# Patient Record
Sex: Female | Born: 1965 | Race: Black or African American | Hispanic: No | Smoking: Never smoker
Health system: Southern US, Community
[De-identification: ages and names within clinical notes are randomized; demographics above are authoritative.]

## PROBLEM LIST (undated history)

## (undated) DIAGNOSIS — R011 Cardiac murmur, unspecified: Secondary | ICD-10-CM

## (undated) DIAGNOSIS — D649 Anemia, unspecified: Secondary | ICD-10-CM

## (undated) DIAGNOSIS — R002 Palpitations: Secondary | ICD-10-CM

## (undated) DIAGNOSIS — R51 Headache: Secondary | ICD-10-CM

## (undated) DIAGNOSIS — D259 Leiomyoma of uterus, unspecified: Secondary | ICD-10-CM

## (undated) DIAGNOSIS — E119 Type 2 diabetes mellitus without complications: Secondary | ICD-10-CM

## (undated) DIAGNOSIS — I499 Cardiac arrhythmia, unspecified: Secondary | ICD-10-CM

## (undated) DIAGNOSIS — D573 Sickle-cell trait: Secondary | ICD-10-CM

## (undated) DIAGNOSIS — M1990 Unspecified osteoarthritis, unspecified site: Secondary | ICD-10-CM

## (undated) DIAGNOSIS — I10 Essential (primary) hypertension: Secondary | ICD-10-CM

## (undated) DIAGNOSIS — T7840XA Allergy, unspecified, initial encounter: Secondary | ICD-10-CM

## (undated) DIAGNOSIS — R0602 Shortness of breath: Secondary | ICD-10-CM

## (undated) DIAGNOSIS — Z5189 Encounter for other specified aftercare: Secondary | ICD-10-CM

## (undated) DIAGNOSIS — J309 Allergic rhinitis, unspecified: Secondary | ICD-10-CM

## (undated) HISTORY — DX: Encounter for other specified aftercare: Z51.89

## (undated) HISTORY — DX: Shortness of breath: R06.02

## (undated) HISTORY — DX: Cardiac murmur, unspecified: R01.1

## (undated) HISTORY — DX: Allergy, unspecified, initial encounter: T78.40XA

## (undated) HISTORY — DX: Leiomyoma of uterus, unspecified: D25.9

## (undated) HISTORY — DX: Type 2 diabetes mellitus without complications: E11.9

## (undated) HISTORY — DX: Headache: R51

## (undated) HISTORY — DX: Allergic rhinitis, unspecified: J30.9

## (undated) HISTORY — DX: Anemia, unspecified: D64.9

## (undated) HISTORY — PX: MYOMECTOMY: SHX85

## (undated) HISTORY — DX: Essential (primary) hypertension: I10

## (undated) HISTORY — DX: Sickle-cell trait: D57.3

## (undated) HISTORY — DX: Palpitations: R00.2

---

## 2002-02-23 ENCOUNTER — Encounter: Payer: Self-pay | Admitting: Emergency Medicine

## 2002-02-23 ENCOUNTER — Emergency Department (HOSPITAL_COMMUNITY): Admission: EM | Admit: 2002-02-23 | Discharge: 2002-02-23 | Payer: Self-pay | Admitting: Emergency Medicine

## 2002-06-01 ENCOUNTER — Encounter: Admission: RE | Admit: 2002-06-01 | Discharge: 2002-06-01 | Payer: Self-pay | Admitting: Internal Medicine

## 2002-06-15 ENCOUNTER — Encounter: Admission: RE | Admit: 2002-06-15 | Discharge: 2002-06-15 | Payer: Self-pay | Admitting: Internal Medicine

## 2002-06-29 ENCOUNTER — Encounter: Admission: RE | Admit: 2002-06-29 | Discharge: 2002-06-29 | Payer: Self-pay | Admitting: Internal Medicine

## 2002-07-26 ENCOUNTER — Encounter: Admission: RE | Admit: 2002-07-26 | Discharge: 2002-07-26 | Payer: Self-pay | Admitting: Internal Medicine

## 2007-02-01 ENCOUNTER — Emergency Department (HOSPITAL_COMMUNITY): Admission: EM | Admit: 2007-02-01 | Discharge: 2007-02-01 | Payer: Self-pay | Admitting: Emergency Medicine

## 2008-06-29 ENCOUNTER — Emergency Department (HOSPITAL_COMMUNITY): Admission: EM | Admit: 2008-06-29 | Discharge: 2008-06-29 | Payer: Self-pay | Admitting: Emergency Medicine

## 2008-08-20 ENCOUNTER — Ambulatory Visit: Payer: Self-pay | Admitting: Internal Medicine

## 2008-08-20 ENCOUNTER — Encounter: Payer: Self-pay | Admitting: Internal Medicine

## 2008-08-20 ENCOUNTER — Other Ambulatory Visit: Admission: RE | Admit: 2008-08-20 | Discharge: 2008-08-20 | Payer: Self-pay | Admitting: Internal Medicine

## 2008-08-20 DIAGNOSIS — R519 Headache, unspecified: Secondary | ICD-10-CM | POA: Insufficient documentation

## 2008-08-20 DIAGNOSIS — R51 Headache: Secondary | ICD-10-CM

## 2008-08-20 DIAGNOSIS — J309 Allergic rhinitis, unspecified: Secondary | ICD-10-CM

## 2008-08-20 DIAGNOSIS — D259 Leiomyoma of uterus, unspecified: Secondary | ICD-10-CM

## 2008-08-20 DIAGNOSIS — D573 Sickle-cell trait: Secondary | ICD-10-CM

## 2008-08-20 DIAGNOSIS — I10 Essential (primary) hypertension: Secondary | ICD-10-CM

## 2008-08-20 HISTORY — DX: Sickle-cell trait: D57.3

## 2008-08-20 HISTORY — DX: Allergic rhinitis, unspecified: J30.9

## 2008-08-20 HISTORY — DX: Leiomyoma of uterus, unspecified: D25.9

## 2008-08-20 HISTORY — DX: Essential (primary) hypertension: I10

## 2008-08-20 HISTORY — DX: Headache: R51

## 2008-08-20 LAB — HM PAP SMEAR

## 2008-08-23 ENCOUNTER — Encounter: Admission: RE | Admit: 2008-08-23 | Discharge: 2008-08-23 | Payer: Self-pay | Admitting: Internal Medicine

## 2008-11-12 ENCOUNTER — Ambulatory Visit: Payer: Self-pay | Admitting: Cardiovascular Disease

## 2008-11-12 DIAGNOSIS — R0602 Shortness of breath: Secondary | ICD-10-CM

## 2008-11-12 DIAGNOSIS — R002 Palpitations: Secondary | ICD-10-CM | POA: Insufficient documentation

## 2008-11-12 DIAGNOSIS — R011 Cardiac murmur, unspecified: Secondary | ICD-10-CM

## 2008-11-12 HISTORY — DX: Palpitations: R00.2

## 2008-11-12 HISTORY — DX: Shortness of breath: R06.02

## 2008-11-12 HISTORY — DX: Cardiac murmur, unspecified: R01.1

## 2008-11-28 ENCOUNTER — Ambulatory Visit: Payer: Self-pay

## 2008-11-28 ENCOUNTER — Encounter: Payer: Self-pay | Admitting: Cardiovascular Disease

## 2008-11-28 ENCOUNTER — Encounter: Payer: Self-pay | Admitting: Internal Medicine

## 2009-11-19 ENCOUNTER — Ambulatory Visit (HOSPITAL_COMMUNITY): Admission: RE | Admit: 2009-11-19 | Discharge: 2009-11-19 | Payer: Self-pay | Admitting: Obstetrics and Gynecology

## 2009-12-21 DIAGNOSIS — Z5189 Encounter for other specified aftercare: Secondary | ICD-10-CM

## 2009-12-21 HISTORY — DX: Encounter for other specified aftercare: Z51.89

## 2010-11-20 ENCOUNTER — Inpatient Hospital Stay (HOSPITAL_COMMUNITY)
Admission: RE | Admit: 2010-11-20 | Discharge: 2010-11-24 | Payer: Self-pay | Source: Home / Self Care | Admitting: Obstetrics and Gynecology

## 2010-11-20 ENCOUNTER — Encounter (INDEPENDENT_AMBULATORY_CARE_PROVIDER_SITE_OTHER): Payer: Self-pay | Admitting: Obstetrics and Gynecology

## 2010-12-11 ENCOUNTER — Ambulatory Visit: Payer: Self-pay | Admitting: Internal Medicine

## 2010-12-17 ENCOUNTER — Telehealth: Payer: Self-pay | Admitting: Internal Medicine

## 2010-12-23 ENCOUNTER — Ambulatory Visit (HOSPITAL_COMMUNITY)
Admission: RE | Admit: 2010-12-23 | Discharge: 2010-12-23 | Payer: Self-pay | Source: Home / Self Care | Attending: Obstetrics and Gynecology | Admitting: Obstetrics and Gynecology

## 2010-12-25 ENCOUNTER — Ambulatory Visit
Admission: RE | Admit: 2010-12-25 | Discharge: 2010-12-25 | Payer: Self-pay | Source: Home / Self Care | Attending: Internal Medicine | Admitting: Internal Medicine

## 2011-01-11 ENCOUNTER — Encounter: Payer: Self-pay | Admitting: Obstetrics and Gynecology

## 2011-01-22 NOTE — Assessment & Plan Note (Signed)
Summary: 2 wk rov/njr   Vital Signs:  Patient profile:   45 year old female Weight:      184 pounds Temp:     98.4 degrees F oral BP sitting:   110 / 70  (right arm) Cuff size:   regular  Vitals Entered By: Duard Brady LPN (December 25, 2010 12:41 PM) CC: rov - doing good - f/u on BP Is Patient Diabetic? No   CC:  rov - doing good - f/u on BP.  History of Present Illness: 45 year old patient who is seen today for follow-up.  She is followed closely by OB/GYN for infertility.  She has hypertension, which has well controlled on her present regimen.  Antihypertensive regimen discussed with OB/GYN and her present regimen is preferred due to a safer long-term side affect profile  during  pregnancy.  She has tolerated the medication well.  She is on a salt restricted diet  Allergies (verified): No Known Drug Allergies  Past History:  Past Medical History: Reviewed history from 11/12/2008 and no changes required. sickle cell trait Allergic rhinitis Headache Hypertension  Past Surgical History: status post myomectomy, December 2011  Family History: Reviewed history from 08/20/2008 and no changes required. father died age 22, for lung cancer, history of prostate cancer mother is 38, hypertension  One sister with treated hypertension 5 brothers are well  Physical Exam  General:  Well-developed,well-nourished,in no acute distress; alert,appropriate and cooperative throughout examination;  the pressure 110/70 Mouth:  Oral mucosa and oropharynx without lesions or exudates.  Teeth in good repair. Neck:  No deformities, masses, or tenderness noted. Lungs:  Normal respiratory effort, chest expands symmetrically. Lungs are clear to auscultation, no crackles or wheezes. Heart:  Normal rate and regular rhythm. S1 and S2 normal without gallop, murmur, click, rub or other extra sounds. Extremities:  trace left pedal edema and trace right pedal edema.  trace left pedal edema.      Impression & Recommendations:  Problem # 1:  HYPERTENSION (ICD-401.9)  The following medications were removed from the medication list:    Amlodipine Besylate 10 Mg Tabs (Amlodipine besylate) ..... One daily    Bisoprolol Fumarate 5 Mg Tabs (Bisoprolol fumarate) ..... One daily Her updated medication list for this problem includes:    Labetalol Hcl 100 Mg Tabs (Labetalol hcl) ..... Qd    Procardia Xl 60 Mg Xr24h-tab (Nifedipine) ..... Qd  The following medications were removed from the medication list:    Amlodipine Besylate 10 Mg Tabs (Amlodipine besylate) ..... One daily    Bisoprolol Fumarate 5 Mg Tabs (Bisoprolol fumarate) ..... One daily Her updated medication list for this problem includes:    Labetalol Hcl 100 Mg Tabs (Labetalol hcl) ..... Qd    Procardia Xl 60 Mg Xr24h-tab (Nifedipine) ..... Qd  Complete Medication List: 1)  Labetalol Hcl 100 Mg Tabs (Labetalol hcl) .... Qd 2)  Procardia Xl 60 Mg Xr24h-tab (Nifedipine) .... Qd 3)  Integra Plus Caps (Fefum-fepoly-fa-b cmp-c-biot) .... Qd  Patient Instructions: 1)  Please schedule a follow-up appointment in 4 months. 2)  Limit your Sodium (Salt). 3)  It is important that you exercise regularly at least 20 minutes 5 times a week. If you develop chest pain, have severe difficulty breathing, or feel very tired , stop exercising immediately and seek medical attention. 4)  You need to lose weight. Consider a lower calorie diet and regular exercise.  Prescriptions: PROCARDIA XL 60 MG XR24H-TAB (NIFEDIPINE) qd  #90 x 4   Entered  and Authorized by:   Gordy Savers  MD   Signed by:   Gordy Savers  MD on 12/25/2010   Method used:   [facility]        [facility] [phone][facility] (retail)       [address] [place], [place]  [address]       Ph: [phone] or [phone]       Fax: [phone]   RxID:   [id number] LABETALOL HCL 100 MG TABS (LABETALOL HCL) qd  #90 x 4   Entered and Authorized by:    Gordy Savers  MD   Signed by:   Gordy Savers  MD on 12/25/2010   Method used:   [facility]        [facility] [phone]* (retail)       [address] [place], [place]  [address]       Ph: [phone] or [phone]       Fax: [phone]   RxID:   [id number]    Orders Added: 1)  Est. Patient Level III [94854]

## 2011-01-22 NOTE — Assessment & Plan Note (Signed)
Summary: HTN CONCERNS // RS   Vital Signs:  Patient profile:   45 year old female Weight:      185 pounds Temp:     98.5 degrees F oral BP sitting:   120 / 80  (right arm) Cuff size:   regular  Vitals Entered By: Duard Brady LPN (December 11, 2010 3:05 PM) CC: HTN concerns Is Patient Diabetic? No   CC:  HTN concerns.  History of Present Illness:  45 year old patient who is seen today after a greater than two-year absence.  At that time, she was seen as a hypertensive suspect the blood pressure was normal and she has not been seen for follow-up.  She has had a recent hysterectomy and blood pressure has been quite elevated and she has recently started on antihypertensive therapy.  She was initially placed on Procardia 30 mg daily, and this has been titrated to 60 mg daily.  Yesterday, labetalol was added to her regimen, but she has not picked up the prescription yet.  She feels well today with some modest postoperative discomfort. Two years ago.  She had a cardiac evaluation due to chest pain and palpitations.  Endocrine medicine stress test was normal and echocardiogram revealed normal LV function and no wall thickening or evidence of LVH  Preventive Screening-Counseling & Management  Alcohol-Tobacco     Smoking Status: never  Allergies (verified): No Known Drug Allergies  Past History:  Past Medical History: Reviewed history from 11/12/2008 and no changes required. sickle cell trait Allergic rhinitis Headache Hypertension  Past Surgical History: status post hysterectomy, December 2011  Family History: Reviewed history from 08/20/2008 and no changes required. father died age 24, for lung cancer, history of prostate cancer mother is 51, hypertension  One sister with treated hypertension 5 brothers are well  Review of Systems  The patient denies anorexia, fever, weight loss, weight gain, vision loss, decreased hearing, hoarseness, chest pain, syncope, dyspnea  on exertion, peripheral edema, prolonged cough, headaches, hemoptysis, abdominal pain, melena, hematochezia, severe indigestion/heartburn, hematuria, incontinence, genital sores, muscle weakness, suspicious skin lesions, transient blindness, difficulty walking, depression, unusual weight change, abnormal bleeding, enlarged lymph nodes, angioedema, and breast masses.    Physical Exam  General:  overweight-appearing.  140/84 ( 120/80. On arrival )overweight-appearing.   Head:  Normocephalic and atraumatic without obvious abnormalities. No apparent alopecia or balding. Mouth:  Oral mucosa and oropharynx without lesions or exudates.  Teeth in good repair. Neck:  No deformities, masses, or tenderness noted. Lungs:  Normal respiratory effort, chest expands symmetrically. Lungs are clear to auscultation, no crackles or wheezes. Heart:  Normal rate and regular rhythm. S1 and S2 normal without gallop, murmur, click, rub or other extra sounds. Extremities:   Melody   Impression & Recommendations:  Problem # 1:  HYPERTENSION (ICD-401.9)  Her updated medication list for this problem includes:    Labetalol Hcl 100 Mg Tabs (Labetalol hcl) ..... Qd    Procardia Xl 60 Mg Xr24h-tab (Nifedipine) ..... Qd    Amlodipine Besylate 10 Mg Tabs (Amlodipine besylate) ..... One daily    Bisoprolol Fumarate 5 Mg Tabs (Bisoprolol fumarate) ..... One daily  Her updated medication list for this problem includes:    Labetalol Hcl 100 Mg Tabs (Labetalol hcl) ..... Qd    Procardia Xl 60 Mg Xr24h-tab (Nifedipine) ..... Qd    Amlodipine Besylate 10 Mg Tabs (Amlodipine besylate) ..... One daily    Bisoprolol Fumarate 5 Mg Tabs (Bisoprolol fumarate) ..... One daily  Problem # 2:  PALPITATIONS, RECURRENT (ICD-785.1)  Her updated medication list for this problem includes:    Labetalol Hcl 100 Mg Tabs (Labetalol hcl) ..... Qd    Bisoprolol Fumarate 5 Mg Tabs (Bisoprolol fumarate) ..... One daily  Her updated medication  list for this problem includes:    Labetalol Hcl 100 Mg Tabs (Labetalol hcl) ..... Qd    Bisoprolol Fumarate 5 Mg Tabs (Bisoprolol fumarate) ..... One daily  Complete Medication List: 1)  Labetalol Hcl 100 Mg Tabs (Labetalol hcl) .... Qd 2)  Procardia Xl 60 Mg Xr24h-tab (Nifedipine) .... Qd 3)  Integra Plus Caps (Fefum-fepoly-fa-b cmp-c-biot) .... Qd 4)  Amlodipine Besylate 10 Mg Tabs (Amlodipine besylate) .... One daily 5)  Bisoprolol Fumarate 5 Mg Tabs (Bisoprolol fumarate) .... One daily  Patient Instructions: 1)  Please schedule a follow-up appointment in 2 weeks. 2)  Limit your Sodium (Salt) to less than 2 grams a day(slightly less than 1/2 a teaspoon) to prevent fluid retention, swelling, or worsening of symptoms. 3)  It is important that you exercise regularly at least 20 minutes 5 times a week. If you develop chest pain, have severe difficulty breathing, or feel very tired , stop exercising immediately and seek medical attention. 4)  You need to lose weight. Consider a lower calorie diet and regular exercise.  Prescriptions: BISOPROLOL FUMARATE 5 MG TABS (BISOPROLOL FUMARATE) one daily  #90 x 0   Entered and Authorized by:   Gordy Savers  MD   Signed by:   Gordy Savers  MD on 12/11/2010   Method used:   Print then Give to Patient   RxID:   [id number] AMLODIPINE BESYLATE 10 MG TABS (AMLODIPINE BESYLATE) one daily  #90 x 0   Entered and Authorized by:   Gordy Savers  MD   Signed by:   Gordy Savers  MD on 12/11/2010   Method used:   Print then Give to Patient   RxID:   [id number]    Orders Added: 1)  Est. Patient Level III [95284]

## 2011-01-22 NOTE — Progress Notes (Signed)
Summary: bp meds  Phone Note Outgoing Call   Call placed by: Duard Brady LPN,  December 17, 2010 1:00 PM Call placed to: Patient Summary of Call: per Dr.Kwiatkowski - stop amlodipine and bisoprolol , continue procardia and labetalol  [facility] Initial call taken by: Duard Brady LPN,  December 17, 2010 1:05 PM    Prescriptions: LABETALOL HCL 100 MG TABS (LABETALOL HCL) qd  #90 x 3   Entered by:   Duard Brady LPN   Authorized by:   Gordy Savers  MD   Signed by:   Duard Brady LPN on 16/09/9603   Method used:   [facility] to        [facility] [phone]* (retail)       [address] [place], [place]  [address]       Ph: [phone] or [phone]       Fax: [phone]   RxID:   [id number]

## 2011-03-03 LAB — PREPARE CRYOPRECIPITATE
Unit division: 0
Unit division: 0
Unit division: 0
Unit division: 0
Unit division: 0

## 2011-03-03 LAB — DIFFERENTIAL
Basophils Absolute: 0 10*3/uL (ref 0.0–0.1)
Basophils Absolute: 0 10*3/uL (ref 0.0–0.1)
Basophils Relative: 0 % (ref 0–1)
Basophils Relative: 1 % (ref 0–1)
Eosinophils Relative: 0 % (ref 0–5)
Eosinophils Relative: 0 % (ref 0–5)
Lymphocytes Relative: 11 % — ABNORMAL LOW (ref 12–46)
Lymphocytes Relative: 4 % — ABNORMAL LOW (ref 12–46)
Lymphs Abs: 0.3 10*3/uL — ABNORMAL LOW (ref 0.7–4.0)
Monocytes Absolute: 0.6 10*3/uL (ref 0.1–1.0)
Monocytes Absolute: 0.7 10*3/uL (ref 0.1–1.0)
Monocytes Absolute: 0.8 10*3/uL (ref 0.1–1.0)
Monocytes Relative: 6 % (ref 3–12)
Monocytes Relative: 9 % (ref 3–12)
Neutro Abs: 8.3 10*3/uL — ABNORMAL HIGH (ref 1.7–7.7)
Neutrophils Relative %: 81 % — ABNORMAL HIGH (ref 43–77)

## 2011-03-03 LAB — TYPE AND SCREEN
ABO/RH(D): A POS
Antibody Screen: NEGATIVE
Unit division: 0
Unit division: 0
Unit division: 0

## 2011-03-03 LAB — BASIC METABOLIC PANEL
BUN: 3 mg/dL — ABNORMAL LOW (ref 6–23)
BUN: 6 mg/dL (ref 6–23)
BUN: 5 mg/dL — ABNORMAL LOW (ref 6–23)
CO2: 29 mEq/L (ref 19–32)
CO2: 22 mEq/L (ref 19–32)
Calcium: 7.5 mg/dL — ABNORMAL LOW (ref 8.4–10.5)
Calcium: 9.4 mg/dL (ref 8.4–10.5)
Chloride: 105 mEq/L (ref 96–112)
Creatinine, Ser: 0.56 mg/dL (ref 0.4–1.2)
GFR calc non Af Amer: >60 mL/min (ref >60)
Glucose, Bld: 114 mg/dL — ABNORMAL HIGH (ref 70–99)
Glucose, Bld: 122 mg/dL — ABNORMAL HIGH (ref 70–99)
Glucose, Bld: 124 mg/dL — ABNORMAL HIGH (ref 70–99)
Potassium: 3.3 mEq/L — ABNORMAL LOW (ref 3.5–5.1)
Potassium: 3.4 mEq/L — ABNORMAL LOW (ref 3.5–5.1)
Sodium: 140 mEq/L (ref 135–145)
Sodium: 139 mEq/L (ref 135–145)

## 2011-03-03 LAB — PREPARE FRESH FROZEN PLASMA
Unit division: 0
Unit division: 0

## 2011-03-03 LAB — PREPARE PLATELETS

## 2011-03-03 LAB — CBC
HCT: 14.6 % — ABNORMAL LOW (ref 36.0–46.0)
HCT: 18.2 % — ABNORMAL LOW (ref 36.0–46.0)
HCT: 22.1 % — ABNORMAL LOW (ref 36.0–46.0)
HCT: 23.5 % — ABNORMAL LOW (ref 36.0–46.0)
HCT: 25.2 % — ABNORMAL LOW (ref 36.0–46.0)
Hemoglobin: 5 g/dL — CL (ref 12.0–15.0)
Hemoglobin: 6.6 g/dL — CL (ref 12.0–15.0)
Hemoglobin: 7.5 g/dL — ABNORMAL LOW (ref 12.0–15.0)
Hemoglobin: 8.1 g/dL — ABNORMAL LOW (ref 12.0–15.0)
Hemoglobin: 8.7 g/dL — ABNORMAL LOW (ref 12.0–15.0)
Hemoglobin: 11.9 g/dL — ABNORMAL LOW (ref 12.0–15.0)
MCH: 31 pg (ref 26.0–34.0)
MCH: 32 pg (ref 26.0–34.0)
MCH: 27.3 pg (ref 26.0–34.0)
MCHC: 34.5 g/dL (ref 30.0–36.0)
MCHC: 34.6 g/dL (ref 30.0–36.0)
MCHC: 34.6 g/dL (ref 30.0–36.0)
MCHC: 36.5 g/dL — ABNORMAL HIGH (ref 30.0–36.0)
MCHC: 33.4 g/dL (ref 30.0–36.0)
MCV: 87.5 fL (ref 78.0–100.0)
MCV: 87.9 fL (ref 78.0–100.0)
MCV: 89.8 fL (ref 78.0–100.0)
Platelets: 102 10*3/uL — ABNORMAL LOW (ref 150–400)
RBC: 1.73 MIL/uL — ABNORMAL LOW (ref 3.87–5.11)
RBC: 1.75 MIL/uL — ABNORMAL LOW (ref 3.87–5.11)
RBC: 2.52 MIL/uL — ABNORMAL LOW (ref 3.87–5.11)
RDW: 15.4 % (ref 11.5–15.5)
RDW: 15.5 % (ref 11.5–15.5)
RDW: 15.6 % — ABNORMAL HIGH (ref 11.5–15.5)
RDW: 15.8 % — ABNORMAL HIGH (ref 11.5–15.5)
RDW: 16.4 % — ABNORMAL HIGH (ref 11.5–15.5)
WBC: 10.2 10*3/uL (ref 4.0–10.5)
WBC: 6 10*3/uL (ref 4.0–10.5)
WBC: 8.9 10*3/uL (ref 4.0–10.5)

## 2011-03-03 LAB — CARDIAC PANEL(CRET KIN+CKTOT+MB+TROPI)
CK, MB: 0.3 ng/mL (ref 0.3–4.0)
Relative Index: 0.2 (ref 0.0–2.5)
Total CK: 170 U/L (ref 7–177)
Troponin I: 0.02 ng/mL (ref 0.00–0.06)

## 2011-03-03 LAB — DIC (DISSEMINATED INTRAVASCULAR COAGULATION)PANEL
D-Dimer, Quant: >20 ug/mL-FEU — ABNORMAL HIGH (ref 0.00–0.48)
Fibrinogen: 333 mg/dL (ref 204–475)
INR: 2.1 — ABNORMAL HIGH (ref 0.00–1.49)
Platelets: 108 10*3/uL — ABNORMAL LOW (ref 150–400)
Prothrombin Time: 21.6 seconds — ABNORMAL HIGH (ref 11.6–15.2)
Smear Review: NONE SEEN
Smear Review: NONE SEEN
Smear Review: NONE SEEN

## 2011-03-03 LAB — PREGNANCY, URINE: Preg Test, Ur: NEGATIVE

## 2011-03-03 LAB — PHOSPHORUS: Phosphorus: 3.1 mg/dL (ref 2.3–4.6)

## 2011-03-05 ENCOUNTER — Telehealth: Payer: Self-pay | Admitting: Internal Medicine

## 2011-03-05 MED ORDER — INTEGRA PLUS PO CAPS: 1.0000 | ORAL_CAPSULE | Freq: Every day | ORAL | Status: DC

## 2011-03-05 NOTE — Telephone Encounter (Signed)
[facility]gra [facility] [place].

## 2011-03-21 ENCOUNTER — Inpatient Hospital Stay (HOSPITAL_COMMUNITY)
Admission: AD | Admit: 2011-03-21 | Discharge: 2011-03-21 | Disposition: A | Discharge status: Home / Self Care | Payer: BC Managed Care – PPO | Source: Ambulatory Visit | Attending: Obstetrics and Gynecology | Admitting: Obstetrics and Gynecology

## 2011-03-21 DIAGNOSIS — R109 Unspecified abdominal pain: Secondary | ICD-10-CM | POA: Insufficient documentation

## 2011-03-21 DIAGNOSIS — N719 Inflammatory disease of uterus, unspecified: Secondary | ICD-10-CM | POA: Insufficient documentation

## 2011-03-21 LAB — CBC
MCH: 27 pg (ref 26.0–34.0)
Platelets: 267 10*3/uL (ref 150–400)
RBC: 4.41 MIL/uL (ref 3.87–5.11)
RDW: 13.8 % (ref 11.5–15.5)
WBC: 8.5 10*3/uL (ref 4.0–10.5)

## 2011-03-21 LAB — URINALYSIS, ROUTINE W REFLEX MICROSCOPIC
Glucose, UA: NEGATIVE mg/dL
Leukocytes, UA: NEGATIVE
Specific Gravity, Urine: 1.02 (ref 1.005–1.030)
pH: 6 (ref 5.0–8.0)

## 2011-03-21 LAB — DIFFERENTIAL
Basophils Relative: 0 % (ref 0–1)
Eosinophils Absolute: 0.1 10*3/uL (ref 0.0–0.7)
Eosinophils Relative: 1 % (ref 0–5)
Monocytes Relative: 7 % (ref 3–12)
Neutrophils Relative %: 77 % (ref 43–77)

## 2011-03-21 LAB — URINE MICROSCOPIC-ADD ON

## 2011-04-27 ENCOUNTER — Encounter: Payer: Self-pay | Admitting: Internal Medicine

## 2011-04-30 ENCOUNTER — Encounter: Payer: Self-pay | Admitting: Internal Medicine

## 2011-04-30 ENCOUNTER — Ambulatory Visit: Payer: Self-pay | Admitting: Internal Medicine

## 2011-04-30 ENCOUNTER — Ambulatory Visit (INDEPENDENT_AMBULATORY_CARE_PROVIDER_SITE_OTHER): Payer: BC Managed Care – PPO | Admitting: Internal Medicine

## 2011-04-30 VITALS — BP 110/74 | Temp 98.3°F | Wt 173.0 lb

## 2011-04-30 DIAGNOSIS — I10 Essential (primary) hypertension: Secondary | ICD-10-CM

## 2011-04-30 MED ORDER — LABETALOL HCL 100 MG PO TABS: 100.0000 mg | ORAL_TABLET | Freq: Every day | ORAL | Status: DC

## 2011-04-30 MED ORDER — NIFEDIPINE 60 MG (OSM) PO TB24: 60.0000 mg | ORAL_TABLET | Freq: Every day | ORAL | Status: DC

## 2011-04-30 NOTE — Patient Instructions (Signed)
Limit your sodium (Salt) intake    It is important that you exercise regularly, at least 20 minutes 3 to 4 times per week.  If you develop chest pain or shortness of breath seek  medical attention.  Please check your blood pressure on a regular basis.  If it is consistently greater than 150/90, please make an office appointment.  Return in 6 months for follow-up   

## 2011-04-30 NOTE — Progress Notes (Signed)
  Subjective:    Patient ID: Katie Morse, female    DOB: 16-Jan-1966, 45 y.o.   MRN: [id number]  HPI 45 year old patient who is seen today for follow up of her hypertension. She is followed closely by GYN for infertility. A gynecologic infection complicated recent procedure. Presently she is well. She does track blood pressure readings occasionally with low normal results. She is followed frequently by GYN due to her infertility blood pressures have always been stable on her present regimen. She is asymptomatic today.   Review of Systems  Constitutional: Negative.   HENT: Negative for hearing loss, congestion, sore throat, rhinorrhea, dental problem, sinus pressure and tinnitus.   Eyes: Negative for pain, discharge and visual disturbance.  Respiratory: Negative for cough and shortness of breath.   Cardiovascular: Negative for chest pain, palpitations and leg swelling.  Gastrointestinal: Negative for nausea, vomiting, abdominal pain, diarrhea, constipation, blood in stool and abdominal distention.  Genitourinary: Negative for dysuria, urgency, frequency, hematuria, flank pain, vaginal bleeding, vaginal discharge, difficulty urinating, vaginal pain and pelvic pain.  Musculoskeletal: Negative for joint swelling, arthralgias and gait problem.  Skin: Negative for rash.  Neurological: Negative for dizziness, syncope, speech difficulty, weakness, numbness and headaches.  Hematological: Negative for adenopathy.  Psychiatric/Behavioral: Negative for behavioral problems, dysphoric mood and agitation. The patient is not nervous/anxious.        Objective:   Physical Exam  Constitutional: She is oriented to person, place, and time. She appears well-developed and well-nourished.  HENT:  Head: Normocephalic.  Right Ear: External ear normal.  Left Ear: External ear normal.  Mouth/Throat: Oropharynx is clear and moist.  Eyes: Conjunctivae and EOM are normal. Pupils are equal, round, and  reactive to light.  Neck: Normal range of motion. Neck supple. No thyromegaly present.  Cardiovascular: Normal rate, regular rhythm, normal heart sounds and intact distal pulses.   Pulmonary/Chest: Effort normal and breath sounds normal.  Abdominal: Soft. Bowel sounds are normal. She exhibits no mass. There is no tenderness.  Musculoskeletal: Normal range of motion.  Lymphadenopathy:    She has no cervical adenopathy.  Neurological: She is alert and oriented to person, place, and time.  Skin: Skin is warm and dry. No rash noted.  Psychiatric: She has a normal mood and affect. Her behavior is normal.          Assessment & Plan:    Hypertension well controlled. We'll continue present regimen low salt diet exercise modest weight loss all encouraged. We'll recheck in 6 months
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
## 2011-09-17 LAB — CBC
Hemoglobin: 12.8
MCHC: 34.1
RDW: 15.1

## 2011-09-17 LAB — DIFFERENTIAL
Basophils Absolute: 0
Basophils Relative: 1
Lymphocytes Relative: 28
Monocytes Absolute: 0.3
Neutro Abs: 3.5
Neutrophils Relative %: 64

## 2011-09-17 LAB — POCT I-STAT, CHEM 8
BUN: 8
Calcium, Ion: 1.16
Chloride: 104
Creatinine, Ser: 0.8
Glucose, Bld: 110 — ABNORMAL HIGH
HCT: 40
Hemoglobin: 13.6
Potassium: 3.5
Sodium: 141
TCO2: 24

## 2011-10-29 ENCOUNTER — Encounter: Payer: Self-pay | Admitting: Internal Medicine

## 2011-10-29 ENCOUNTER — Ambulatory Visit (INDEPENDENT_AMBULATORY_CARE_PROVIDER_SITE_OTHER): Payer: BC Managed Care – PPO | Admitting: Internal Medicine

## 2011-10-29 VITALS — BP 140/90 | Temp 98.8°F | Wt 179.0 lb

## 2011-10-29 DIAGNOSIS — I10 Essential (primary) hypertension: Secondary | ICD-10-CM

## 2011-10-29 DIAGNOSIS — Z0000 Encounter for general adult medical examination without abnormal findings: Secondary | ICD-10-CM

## 2011-10-29 DIAGNOSIS — Z23 Encounter for immunization: Secondary | ICD-10-CM

## 2011-10-29 MED ORDER — LABETALOL HCL 100 MG PO TABS: 100.0000 mg | ORAL_TABLET | Freq: Every day | ORAL | Status: DC

## 2011-10-29 NOTE — Patient Instructions (Signed)
Limit your sodium (Salt) intake  Please check your blood pressure on a regular basis.  If it is consistently greater than 150/90, please make an office appointment.  Return in 6 months for follow-up   

## 2011-10-29 NOTE — Progress Notes (Signed)
  Subjective:    Patient ID: Katie Morse, female    DOB: 22-Jun-1966, 45 y.o.   MRN: [id number]  HPI  45 year old patient who is seen today for followup. She has treated hypertension. He is followed by gynecology. She has a history of palpitations which have been stable. She is seen today for her six-month followup and has no concerns or complaints. No home blood pressure monitoring. Blood pressure is treated with beta blocker therapy as well as nifedipine.    Review of Systems  Constitutional: Negative.   HENT: Negative for hearing loss, congestion, sore throat, rhinorrhea, dental problem, sinus pressure and tinnitus.   Eyes: Negative for pain, discharge and visual disturbance.  Respiratory: Negative for cough and shortness of breath.   Cardiovascular: Negative for chest pain, palpitations and leg swelling.  Gastrointestinal: Negative for nausea, vomiting, abdominal pain, diarrhea, constipation, blood in stool and abdominal distention.  Genitourinary: Negative for dysuria, urgency, frequency, hematuria, flank pain, vaginal bleeding, vaginal discharge, difficulty urinating, vaginal pain and pelvic pain.  Musculoskeletal: Negative for joint swelling, arthralgias and gait problem.  Skin: Negative for rash.  Neurological: Negative for dizziness, syncope, speech difficulty, weakness, numbness (occasional tingling involving the left leg) and headaches.  Hematological: Negative for adenopathy.  Psychiatric/Behavioral: Negative for behavioral problems, dysphoric mood and agitation. The patient is not nervous/anxious.        Objective:   Physical Exam  Constitutional: She is oriented to person, place, and time. She appears well-developed and well-nourished.  HENT:  Head: Normocephalic.  Right Ear: External ear normal.  Left Ear: External ear normal.  Mouth/Throat: Oropharynx is clear and moist.  Eyes: Conjunctivae and EOM are normal. Pupils are equal, round, and reactive to light.    Neck: Normal range of motion. Neck supple. No thyromegaly present.  Cardiovascular: Normal rate, regular rhythm, normal heart sounds and intact distal pulses.   Pulmonary/Chest: Effort normal and breath sounds normal.  Abdominal: Soft. Bowel sounds are normal. She exhibits no mass. There is no tenderness.  Musculoskeletal: Normal range of motion.  Lymphadenopathy:    She has no cervical adenopathy.  Neurological: She is alert and oriented to person, place, and time.  Skin: Skin is warm and dry. No rash noted.  Psychiatric: She has a normal mood and affect. Her behavior is normal.          Assessment & Plan:    Hypertension well controlled. Repeat blood pressure 130/84 we'll continue to monitor and continue her present regimen. We'll see in 6 months for an annual exam and laboratory update Palpitations asymptomatic

## 2012-01-14 ENCOUNTER — Telehealth: Payer: Self-pay | Admitting: Internal Medicine

## 2012-01-14 NOTE — Telephone Encounter (Signed)
Has patient been notified?

## 2012-01-14 NOTE — Telephone Encounter (Signed)
Stop labetolol and continue amlodipine;  Home bp monitoring;  ROV if unimproved

## 2012-01-14 NOTE — Telephone Encounter (Signed)
Pt called called and has questions re: her blood pressure med. Pt said that she started feeling bad when she takes bp med for long periods of time and when pt skip med for a few days, she starts feeling better. Pls call.

## 2012-01-14 NOTE — Telephone Encounter (Signed)
Pt aware.

## 2012-02-23 ENCOUNTER — Telehealth: Payer: Self-pay | Admitting: *Deleted

## 2012-02-23 ENCOUNTER — Telehealth: Payer: Self-pay | Admitting: Internal Medicine

## 2012-02-23 ENCOUNTER — Telehealth: Payer: Self-pay

## 2012-02-23 NOTE — Telephone Encounter (Signed)
Spoke with pt - does not have home monitor

## 2012-02-23 NOTE — Telephone Encounter (Signed)
Pt has appt on schedule tomorrow 815am

## 2012-02-23 NOTE — Telephone Encounter (Signed)
BP 130/111  BPs have been averaging these numbers, and wants to know if she should be concerned??

## 2012-02-23 NOTE — Telephone Encounter (Signed)
i noted appt on schedule tomorrow 815am

## 2012-02-23 NOTE — Telephone Encounter (Signed)
Called pt - informed last BP reading 140 / 90 in nov

## 2012-02-23 NOTE — Telephone Encounter (Signed)
Pt called and states last Friday pt had ringing in hears, generally felt terrible, headache, and blurred vision.  Pt states she felt like her bp was high but did not get it checked.  On today pt had her bp checked and it is 138/111.  Advised pt that she needed to come in for an office visit.  Pt states she is the only one at work and her boss is gone and pt is not sure when she will return.  Pt will call back to see if appt slots available if her boss returns early enough.   Pt has an appt for 02/24/12 at 8:15.  Pt advised to go to ER if symptoms worsen and pt cannot get into the office today.

## 2012-02-23 NOTE — Telephone Encounter (Signed)
rov in am  Bring home  bp monitor

## 2012-02-23 NOTE — Telephone Encounter (Signed)
Pt just needs last bp reading.

## 2012-02-24 ENCOUNTER — Ambulatory Visit (INDEPENDENT_AMBULATORY_CARE_PROVIDER_SITE_OTHER): Payer: BC Managed Care – PPO | Admitting: Internal Medicine

## 2012-02-24 ENCOUNTER — Encounter: Payer: Self-pay | Admitting: Internal Medicine

## 2012-02-24 ENCOUNTER — Telehealth: Payer: Self-pay | Admitting: Internal Medicine

## 2012-02-24 DIAGNOSIS — R011 Cardiac murmur, unspecified: Secondary | ICD-10-CM

## 2012-02-24 DIAGNOSIS — I10 Essential (primary) hypertension: Secondary | ICD-10-CM

## 2012-02-24 MED ORDER — LISINOPRIL-HYDROCHLOROTHIAZIDE 20-12.5 MG PO TABS: 1.0000 | ORAL_TABLET | Freq: Every day | ORAL | Status: DC

## 2012-02-24 NOTE — Telephone Encounter (Signed)
Patient called stating that she would like to give the MD a list of meds per her GYN that she can take while trying to conceive: norvasc, topral, or the nifedipine. Please advise.

## 2012-02-24 NOTE — Patient Instructions (Signed)

## 2012-02-24 NOTE — Progress Notes (Signed)
  Subjective:    Patient ID: Katie Morse, female    DOB: 1966/09/08, 46 y.o.   MRN: [id number]  HPI  BP Readings from Last 3 Encounters:  02/24/12 124/82  10/29/11 140/90  04/30/11 58/46   46 year old patient who is seen today for followup of her hypertension.  She was noted to have some perioperative hypotension and was placed on labetalol and nifedipine by OB/GYN. Labetalol was discontinued in January. She has been somewhat inconsistent with taking her medications and yesterday had some high blood pressure readings. She also complained of some ringing in the ears and some headaches. She now has been back on her nifedipine 60. Blood pressure today is normal there have been some cough considerations with her medication as well  Review of Systems  Constitutional: Negative.   HENT: Positive for tinnitus. Negative for hearing loss, congestion, sore throat, rhinorrhea, dental problem and sinus pressure.   Eyes: Negative for pain, discharge and visual disturbance.  Respiratory: Negative for cough and shortness of breath.   Cardiovascular: Negative for chest pain, palpitations and leg swelling.  Gastrointestinal: Negative for nausea, vomiting, abdominal pain, diarrhea, constipation, blood in stool and abdominal distention.  Genitourinary: Negative for dysuria, urgency, frequency, hematuria, flank pain, vaginal bleeding, vaginal discharge, difficulty urinating, vaginal pain and pelvic pain.  Musculoskeletal: Negative for joint swelling, arthralgias and gait problem.  Skin: Negative for rash.  Neurological: Positive for headaches. Negative for dizziness, syncope, speech difficulty, weakness and numbness.  Hematological: Negative for adenopathy.  Psychiatric/Behavioral: Negative for behavioral problems, dysphoric mood and agitation. The patient is not nervous/anxious.        Objective:   Physical Exam  Constitutional: She is oriented to person, place, and time. She appears  well-developed and well-nourished.  HENT:  Head: Normocephalic.  Right Ear: External ear normal.  Left Ear: External ear normal.  Mouth/Throat: Oropharynx is clear and moist.  Eyes: Conjunctivae and EOM are normal. Pupils are equal, round, and reactive to light.  Neck: Normal range of motion. Neck supple. No thyromegaly present.  Cardiovascular: Normal rate, regular rhythm and intact distal pulses.   Murmur heard. Pulmonary/Chest: Effort normal and breath sounds normal.  Abdominal: Soft. Bowel sounds are normal. She exhibits no mass. There is no tenderness.  Musculoskeletal: Normal range of motion.  Lymphadenopathy:    She has no cervical adenopathy.  Neurological: She is alert and oriented to person, place, and time.  Skin: Skin is warm and dry. No rash noted.  Psychiatric: She has a normal mood and affect. Her behavior is normal.          Assessment & Plan:   Hypertension well controlled today. We'll switch to lisinopril hydrochlorothiazide combination for cost considerations. This should provide with daily nice control. Low-salt diet weight loss encouraged. Will continue home blood pressure monitoring. Recheck here in 3 months

## 2012-02-25 MED ORDER — METOPROLOL TARTRATE 50 MG PO TABS: 50.0000 mg | ORAL_TABLET | Freq: Two times a day (BID) | ORAL | Status: DC

## 2012-02-25 NOTE — Telephone Encounter (Signed)
Addended by: Duard Brady I on: 02/25/2012 11:58 AM   Modules accepted: Orders

## 2012-02-25 NOTE — Telephone Encounter (Signed)
Spoke with pt - informed of changes - new rx sent to [facility]

## 2012-02-25 NOTE — Telephone Encounter (Signed)
Discontinue lisinopril hydrochlorothiazide- this was a new prescription ordered at her last visit; Have patient continue nifedipine 60 Toprol generic  50 mg 1 daily-  please call in a new prescription for Toprol and notify patient

## 2012-03-03 ENCOUNTER — Telehealth: Payer: Self-pay | Admitting: *Deleted

## 2012-03-03 NOTE — Telephone Encounter (Signed)
Please call pt re: her BP meds.  She is very confused on what to take due to the fact she is trying to get pregnant.

## 2012-03-04 NOTE — Telephone Encounter (Signed)
Spoke with pt - informed lisinopril hczt stopped - procardia and metoprolol is what she should be taken. KIK

## 2012-04-20 ENCOUNTER — Other Ambulatory Visit: Payer: BC Managed Care – PPO

## 2012-04-27 ENCOUNTER — Encounter: Payer: BC Managed Care – PPO | Admitting: Internal Medicine

## 2012-05-11 ENCOUNTER — Other Ambulatory Visit (INDEPENDENT_AMBULATORY_CARE_PROVIDER_SITE_OTHER): Payer: BC Managed Care – PPO

## 2012-05-11 DIAGNOSIS — Z0000 Encounter for general adult medical examination without abnormal findings: Secondary | ICD-10-CM

## 2012-05-11 LAB — BASIC METABOLIC PANEL
CO2: 26 mEq/L (ref 19–32)
Calcium: 8.9 mg/dL (ref 8.4–10.5)
Chloride: 107 mEq/L (ref 96–112)
Glucose, Bld: 102 mg/dL — ABNORMAL HIGH (ref 70–99)
Potassium: 3.8 mEq/L (ref 3.5–5.1)
Sodium: 141 mEq/L (ref 135–145)

## 2012-05-11 LAB — TSH: TSH: 1.26 u[IU]/mL (ref 0.35–5.50)

## 2012-05-11 LAB — CBC WITH DIFFERENTIAL/PLATELET
Basophils Absolute: 0 10*3/uL (ref 0.0–0.1)
Basophils Relative: 0.7 % (ref 0.0–3.0)
Eosinophils Absolute: 0.2 10*3/uL (ref 0.0–0.7)
HCT: 38.7 % (ref 36.0–46.0)
Hemoglobin: 12.7 g/dL (ref 12.0–15.0)
Lymphocytes Relative: 27.9 % (ref 12.0–46.0)
Lymphs Abs: 1.4 10*3/uL (ref 0.7–4.0)
MCHC: 32.8 g/dL (ref 30.0–36.0)
MCV: 84.8 fl (ref 78.0–100.0)
Neutro Abs: 3.1 10*3/uL (ref 1.4–7.7)
RBC: 4.57 Mil/uL (ref 3.87–5.11)
RDW: 14.6 % (ref 11.5–14.6)

## 2012-05-11 LAB — POCT URINALYSIS DIPSTICK
Bilirubin, UA: NEGATIVE
Ketones, UA: NEGATIVE
Leukocytes, UA: NEGATIVE
Spec Grav, UA: 1.015

## 2012-05-11 LAB — LIPID PANEL: Triglycerides: 54 mg/dL (ref 0.0–149.0)

## 2012-05-11 LAB — LDL CHOLESTEROL, DIRECT: Direct LDL: 135 mg/dL

## 2012-05-11 LAB — HEPATIC FUNCTION PANEL
Albumin: 3.9 g/dL (ref 3.5–5.2)
Alkaline Phosphatase: 54 U/L (ref 39–117)
Total Protein: 7.4 g/dL (ref 6.0–8.3)

## 2012-05-12 ENCOUNTER — Other Ambulatory Visit: Payer: BC Managed Care – PPO

## 2012-05-19 ENCOUNTER — Ambulatory Visit (INDEPENDENT_AMBULATORY_CARE_PROVIDER_SITE_OTHER): Payer: BC Managed Care – PPO | Admitting: Internal Medicine

## 2012-05-19 ENCOUNTER — Encounter: Payer: Self-pay | Admitting: Internal Medicine

## 2012-05-19 VITALS — BP 114/80 | HR 64 | Temp 98.3°F | Resp 20 | Ht 63.0 in | Wt 193.0 lb

## 2012-05-19 DIAGNOSIS — I10 Essential (primary) hypertension: Secondary | ICD-10-CM

## 2012-05-19 DIAGNOSIS — Z0000 Encounter for general adult medical examination without abnormal findings: Secondary | ICD-10-CM

## 2012-05-19 MED ORDER — METOPROLOL TARTRATE 50 MG PO TABS: 50.0000 mg | ORAL_TABLET | Freq: Two times a day (BID) | ORAL | Status: DC

## 2012-05-19 NOTE — Progress Notes (Signed)
Subjective:    Patient ID: Katie Morse, female    DOB: 09/28/1966, 46 y.o.   MRN: [id number]  HPI 46 year old patient who is seen today for a revisit health examination. She has a history of hypertension. She is followed closely by gynecology. She also has a history of palpitations and has been on beta blocker therapy she has occasional headaches. In general doing quite well she has been adhering to a [employer]. He is scheduled for gynecology followup soon.  Past Medical History  Diagnosis Date  . ALLERGIC RHINITIS 08/20/2008  . DYSPNEA 11/12/2008  . Headache 08/20/2008  . HYPERTENSION 08/20/2008  . LEIOMYOMA, UTERUS 08/20/2008  . PALPITATIONS, RECURRENT 11/12/2008  . Sickle-cell trait 08/20/2008  . SYSTOLIC MURMUR 11/12/2008    History   Social History  . Marital Status: Married    Spouse Name: N/A    Number of Children: N/A  . Years of Education: N/A   Occupational History  . Not on file.   Social History Main Topics  . Smoking status: Never Smoker   . Smokeless tobacco: Never Used  . Alcohol Use: Yes     rarely  . Drug Use: No  . Sexually Active: Not on file   Other Topics Concern  . Not on file   Social History Narrative  . No narrative on file    Past Surgical History  Procedure Date  . Myomectomy     Family History  Problem Relation Age of Onset  . Hypertension Mother   . Cancer Father     lung and prostate ca  . Hypertension Sister     Allergies  Allergen Reactions  . Latex     Current Outpatient Prescriptions on File Prior to Visit  Medication Sig Dispense Refill  . FeFum-FePoly-FA-B Cmp-C-Biot (INTEGRA PLUS) CAPS Take 1 capsule by mouth daily.  90 capsule  2  . DISCONTD: metoprolol (LOPRESSOR) 50 MG tablet Take 1 tablet (50 mg total) by mouth 2 (two) times daily.  90 tablet  1  . DISCONTD: lisinopril-hydrochlorothiazide (ZESTORETIC) 20-12.5 MG per tablet Take 1 tablet by mouth daily.  90 tablet  3    BP 114/80  Pulse 64   Temp(Src) 98.3 F (36.8 C) (Oral)  Resp 20  Ht 5\' 3"  (1.6 m)  Wt 193 lb (87.544 kg)  BMI 34.19 kg/m2  SpO2 99%      Review of Systems  Constitutional: Negative for fever, appetite change, fatigue and unexpected weight change.  HENT: Negative for hearing loss, ear pain, nosebleeds, congestion, sore throat, mouth sores, trouble swallowing, neck stiffness, dental problem, voice change, sinus pressure and tinnitus.   Eyes: Negative for photophobia, pain, redness and visual disturbance.  Respiratory: Negative for cough, chest tightness and shortness of breath.   Cardiovascular: Negative for chest pain, palpitations and leg swelling.  Gastrointestinal: Negative for nausea, vomiting, abdominal pain, diarrhea, constipation, blood in stool, abdominal distention and rectal pain.  Genitourinary: Negative for dysuria, urgency, frequency, hematuria, flank pain, vaginal bleeding, vaginal discharge, difficulty urinating, genital sores, vaginal pain, menstrual problem and pelvic pain.  Musculoskeletal: Negative for back pain and arthralgias.  Skin: Negative for rash.  Neurological: Positive for headaches. Negative for dizziness, syncope, speech difficulty, weakness, light-headedness and numbness.  Hematological: Negative for adenopathy. Does not bruise/bleed easily.  Psychiatric/Behavioral: Negative for suicidal ideas, behavioral problems, self-injury, dysphoric mood and agitation. The patient is not nervous/anxious.        Objective:   Physical Exam  Constitutional: She is oriented  to person, place, and time. She appears well-developed and well-nourished.  HENT:  Head: Normocephalic and atraumatic.  Right Ear: External ear normal.  Left Ear: External ear normal.  Mouth/Throat: Oropharynx is clear and moist.  Eyes: Conjunctivae and EOM are normal.  Neck: Normal range of motion. Neck supple. No JVD present. No thyromegaly present.  Cardiovascular: Normal rate, regular rhythm, normal heart sounds  and intact distal pulses.   No murmur heard. Pulmonary/Chest: Effort normal and breath sounds normal. She has no wheezes. She has no rales.  Abdominal: Soft. Bowel sounds are normal. She exhibits no distension and no mass. There is no tenderness. There is no rebound and no guarding.  Musculoskeletal: Normal range of motion. She exhibits no edema and no tenderness.  Neurological: She is alert and oriented to person, place, and time. She has normal reflexes. No cranial nerve deficit. She exhibits normal muscle tone. Coordination normal.  Skin: Skin is warm and dry. No rash noted.  Psychiatric: She has a normal mood and affect. Her behavior is normal.          Assessment & Plan:    Preventive health examination Hypertension stable  Low-salt diet exercise regimen additional weight loss all encouraged. Will followup with GYN. Return here in 6 months or as needed. Home blood pressure monitor and encourage

## 2012-05-19 NOTE — Patient Instructions (Signed)

## 2012-06-17 ENCOUNTER — Encounter: Payer: BC Managed Care – PPO | Admitting: Obstetrics and Gynecology

## 2012-07-06 ENCOUNTER — Encounter: Payer: BC Managed Care – PPO | Admitting: Obstetrics and Gynecology

## 2012-09-21 ENCOUNTER — Other Ambulatory Visit: Payer: Self-pay | Admitting: Internal Medicine

## 2012-11-14 ENCOUNTER — Encounter: Payer: Self-pay | Admitting: Internal Medicine

## 2012-11-14 ENCOUNTER — Ambulatory Visit (INDEPENDENT_AMBULATORY_CARE_PROVIDER_SITE_OTHER): Payer: BC Managed Care – PPO | Admitting: Internal Medicine

## 2012-11-14 VITALS — BP 130/86 | HR 64 | Temp 98.1°F | Resp 18 | Ht 62.5 in | Wt 200.0 lb

## 2012-11-14 DIAGNOSIS — Z0000 Encounter for general adult medical examination without abnormal findings: Secondary | ICD-10-CM

## 2012-11-14 DIAGNOSIS — Z23 Encounter for immunization: Secondary | ICD-10-CM

## 2012-11-14 DIAGNOSIS — I10 Essential (primary) hypertension: Secondary | ICD-10-CM

## 2012-11-14 MED ORDER — METOPROLOL TARTRATE 50 MG PO TABS: 50.0000 mg | ORAL_TABLET | Freq: Two times a day (BID) | ORAL | Status: DC

## 2012-11-14 NOTE — Patient Instructions (Signed)

## 2012-11-14 NOTE — Progress Notes (Signed)
Subjective:    Patient ID: Katie Morse, female    DOB: 09/24/1966, 46 y.o.   MRN: [id number]  HPI  46 year old patient who is seen today for a preventive health examination  And DOT physical. She has treated hypertension since January of 2012 she is doing quite well today. No new concerns or complaints.  Past Medical History  Diagnosis Date  . ALLERGIC RHINITIS 08/20/2008  . DYSPNEA 11/12/2008  . Headache 08/20/2008  . HYPERTENSION 08/20/2008  . LEIOMYOMA, UTERUS 08/20/2008  . PALPITATIONS, RECURRENT 11/12/2008  . Sickle-cell trait 08/20/2008  . SYSTOLIC MURMUR 11/12/2008    History   Social History  . Marital Status: Married    Spouse Name: N/A    Number of Children: N/A  . Years of Education: N/A   Occupational History  . Not on file.   Social History Main Topics  . Smoking status: Never Smoker   . Smokeless tobacco: Never Used  . Alcohol Use: Yes     Comment: rarely  . Drug Use: No  . Sexually Active: Not on file   Other Topics Concern  . Not on file   Social History Narrative  . No narrative on file    Past Surgical History  Procedure Date  . Myomectomy     Family History  Problem Relation Age of Onset  . Hypertension Mother   . Cancer Father     lung and prostate ca  . Hypertension Sister     Allergies  Allergen Reactions  . Latex     Current Outpatient Prescriptions on File Prior to Visit  Medication Sig Dispense Refill  . FeFum-FePoly-FA-B Cmp-C-Biot (INTEGRA PLUS) CAPS Take 1 capsule by mouth daily.  90 capsule  2  . metoprolol (LOPRESSOR) 50 MG tablet Take 1 tablet (50 mg total) by mouth 2 (two) times daily.  90 tablet  1  . metoprolol (LOPRESSOR) 50 MG tablet TAKE 1 TABLET TWICE A DAY  180 tablet  1  . [DISCONTINUED] lisinopril-hydrochlorothiazide (ZESTORETIC) 20-12.5 MG per tablet Take 1 tablet by mouth daily.  90 tablet  3    BP 130/86  Pulse 64  Temp 98.1 F (36.7 C) (Oral)  Resp 18  Ht 5' 2.5" (1.588 m)  Wt 200 lb  (90.719 kg)  BMI 36.00 kg/m2  SpO2 96%  LMP 11/03/2012      Review of Systems  Constitutional: Negative.   HENT: Negative for hearing loss, congestion, sore throat, rhinorrhea, dental problem, sinus pressure and tinnitus.   Eyes: Negative for pain, discharge and visual disturbance.  Respiratory: Negative for cough and shortness of breath.   Cardiovascular: Negative for chest pain, palpitations and leg swelling.  Gastrointestinal: Negative for nausea, vomiting, abdominal pain, diarrhea, constipation, blood in stool and abdominal distention.  Genitourinary: Negative for dysuria, urgency, frequency, hematuria, flank pain, vaginal bleeding, vaginal discharge, difficulty urinating, vaginal pain and pelvic pain.  Musculoskeletal: Negative for joint swelling, arthralgias and gait problem.  Skin: Negative for rash.  Neurological: Negative for dizziness, syncope, speech difficulty, weakness, numbness and headaches.  Hematological: Negative for adenopathy.  Psychiatric/Behavioral: Negative for behavioral problems, dysphoric mood and agitation. The patient is not nervous/anxious.        Objective:   Physical Exam  Constitutional: She is oriented to person, place, and time. She appears well-developed and well-nourished.       Blood pressure 130/85 Weight 200  HENT:  Head: Normocephalic.  Right Ear: External ear normal.  Left Ear: External ear normal.  Mouth/Throat: Oropharynx  is clear and moist.  Eyes: Conjunctivae normal and EOM are normal. Pupils are equal, round, and reactive to light.  Neck: Normal range of motion. Neck supple. No thyromegaly present.  Cardiovascular: Normal rate, regular rhythm, normal heart sounds and intact distal pulses.   Pulmonary/Chest: Effort normal and breath sounds normal.  Abdominal: Soft. Bowel sounds are normal. She exhibits no mass. There is no tenderness.  Musculoskeletal: Normal range of motion.  Lymphadenopathy:    She has no cervical adenopathy.    Neurological: She is alert and oriented to person, place, and time.  Skin: Skin is warm and dry. No rash noted.  Psychiatric: She has a normal mood and affect. Her behavior is normal.          Assessment & Plan:   Preventive health examination DOT physical. Forms completed Exogenous obesity. Weight loss encouraged Hypertension stable  Recheck 1 year

## 2012-11-21 ENCOUNTER — Ambulatory Visit: Payer: BC Managed Care – PPO | Admitting: Internal Medicine

## 2013-03-24 ENCOUNTER — Ambulatory Visit (INDEPENDENT_AMBULATORY_CARE_PROVIDER_SITE_OTHER): Payer: BC Managed Care – PPO | Admitting: Internal Medicine

## 2013-03-24 DIAGNOSIS — Z299 Encounter for prophylactic measures, unspecified: Secondary | ICD-10-CM

## 2013-03-27 LAB — TB SKIN TEST
Induration: 0 mm
TB Skin Test: NEGATIVE

## 2013-04-27 ENCOUNTER — Other Ambulatory Visit (HOSPITAL_COMMUNITY): Payer: Self-pay | Admitting: Obstetrics and Gynecology

## 2013-04-27 DIAGNOSIS — Z1231 Encounter for screening mammogram for malignant neoplasm of breast: Secondary | ICD-10-CM

## 2013-05-16 ENCOUNTER — Ambulatory Visit: Payer: BC Managed Care – PPO | Admitting: Internal Medicine

## 2013-06-19 ENCOUNTER — Ambulatory Visit (HOSPITAL_COMMUNITY)
Admission: RE | Admit: 2013-06-19 | Discharge: 2013-06-19 | Disposition: A | Discharge status: Home / Self Care | Payer: BC Managed Care – PPO | Source: Ambulatory Visit | Attending: Obstetrics and Gynecology | Admitting: Obstetrics and Gynecology

## 2013-06-19 DIAGNOSIS — Z1231 Encounter for screening mammogram for malignant neoplasm of breast: Secondary | ICD-10-CM

## 2013-06-21 ENCOUNTER — Ambulatory Visit: Payer: BC Managed Care – PPO | Admitting: Internal Medicine

## 2013-06-21 ENCOUNTER — Other Ambulatory Visit (HOSPITAL_COMMUNITY): Payer: Self-pay | Admitting: Obstetrics and Gynecology

## 2013-06-21 DIAGNOSIS — Z1231 Encounter for screening mammogram for malignant neoplasm of breast: Secondary | ICD-10-CM

## 2013-07-05 ENCOUNTER — Ambulatory Visit (HOSPITAL_COMMUNITY)
Admission: RE | Admit: 2013-07-05 | Discharge: 2013-07-05 | Disposition: A | Discharge status: Home / Self Care | Payer: 59 | Source: Ambulatory Visit | Attending: Obstetrics and Gynecology | Admitting: Obstetrics and Gynecology

## 2013-07-05 DIAGNOSIS — Z1231 Encounter for screening mammogram for malignant neoplasm of breast: Secondary | ICD-10-CM | POA: Insufficient documentation

## 2013-07-20 ENCOUNTER — Encounter: Payer: 59 | Attending: Obstetrics and Gynecology

## 2013-07-20 VITALS — Ht 62.5 in | Wt 197.4 lb

## 2013-07-20 DIAGNOSIS — Z713 Dietary counseling and surveillance: Secondary | ICD-10-CM | POA: Insufficient documentation

## 2013-07-20 DIAGNOSIS — E119 Type 2 diabetes mellitus without complications: Secondary | ICD-10-CM | POA: Insufficient documentation

## 2013-07-20 NOTE — Patient Instructions (Signed)
Goals:  Follow Diabetes Meal Plan as instructed  Eat 3 meals and 2 snacks, every 3-5 hrs  Limit carbohydrate intake to 30-45 grams carbohydrate/meal  Limit carbohydrate intake to 15 grams carbohydrate/snack  Add lean protein foods to meals/snacks  Monitor glucose levels as instructed by your doctor  Aim for 30 mins of physical activity daily  Bring food record and glucose log to your next nutrition visit 

## 2013-07-20 NOTE — Progress Notes (Signed)
Patient was seen on 07/20/13 for the first of a series of three diabetes self-management courses at the [facility].   Current HbA1c: 6.7%  The following learning objectives were met by the patient during this course:   Defines the role of glucose and insulin  Identifies type of diabetes and pathophysiology  Defines the diagnostic criteria for diabetes and prediabetes  States the risk factors for Type 2 Diabetes  States the symptoms of Type 2 Diabetes  Defines Type 2 Diabetes treatment goals  Defines Type 2 Diabetes treatment options  States the rationale for glucose monitoring  Identifies A1C, glucose targets, and testing times  Identifies proper sharps disposal  Defines the purpose of a diabetes food plan  Identifies carbohydrate food groups  Defines effects of carbohydrate foods on glucose levels  Identifies carbohydrate choices/grams/food labels  States benefits of physical activity and effect on glucose  Review of suggested activity guidelines  Handouts given during class include:  Type 2 Diabetes: Basics Book  My Food Plan Book  Food and Activity Log  Your patient has identified their diabetes self-care support plan as:  [facility] support group  Follow-Up Plan: Attend core 2 and 3

## 2013-08-07 ENCOUNTER — Ambulatory Visit: Payer: BC Managed Care – PPO | Admitting: Internal Medicine

## 2013-08-10 ENCOUNTER — Encounter: Payer: 59 | Attending: Obstetrics and Gynecology

## 2013-08-10 DIAGNOSIS — E119 Type 2 diabetes mellitus without complications: Secondary | ICD-10-CM

## 2013-08-10 DIAGNOSIS — Z713 Dietary counseling and surveillance: Secondary | ICD-10-CM | POA: Insufficient documentation

## 2013-08-10 NOTE — Progress Notes (Signed)
Patient was seen on 08/10/13 for the second of a series of three diabetes self-management courses at the [facility]. The following learning objectives were met by the patient during this course:   Explain basic nutrition maintenance and quality assurance  Describe causes, symptoms and treatment of hypoglycemia and hyperglycemia  Explain how to manage diabetes during illness  Describe the importance of good nutrition for health and healthy eating strategies  List strategies to follow meal plan when dining out  Describe the effects of alcohol on glucose and how to use it safely  Describe problem solving skills for day-to-day glucose challenges  Describe strategies to use when treatment plan needs to change  Identify important factors involved in successful weight loss  Describe ways to remain physically active  Describe the impact of regular activity on insulin resistance  Identify current diabetes medications, their action on blood glucose, and [pssible side effects.  Handouts given in class:  Refrigerator magnet for Sick Day Guidelines  [facility] Oral medication/insulin handout  Your patient has identified their diabetes self-care support plan as:  [facility] support group   Follow-Up Plan: Patient will attend the final class of the ADA Diabetes Self-Care Education.   

## 2013-08-24 ENCOUNTER — Encounter: Payer: 59 | Attending: Obstetrics and Gynecology

## 2013-08-24 DIAGNOSIS — E119 Type 2 diabetes mellitus without complications: Secondary | ICD-10-CM

## 2013-08-24 DIAGNOSIS — Z713 Dietary counseling and surveillance: Secondary | ICD-10-CM | POA: Insufficient documentation

## 2013-08-24 NOTE — Progress Notes (Signed)
Patient was seen on 08/24/13 for the third of a series of three diabetes self-management courses at the [facility]. The following learning objectives were met by the patient during this course:    Describe how diabetes changes over time   Identify diabetes complications and ways to prevent them   Describe strategies that can promote heart health including lowering blood pressure and cholesterol   Describe strategies to lower dietary fat and sodium in the diet   Identify physical activities that benefit cardiovascular health   Describe role of stress on blood glucose and develop strategies to address psychosocial issues   Evaluate success in meeting personal goal   Describe the belief that they can live successfully with diabetes day to day   Establish 2-3 goals that they will plan to diligently work on until they return for the free 64-month follow-up visit  The following handouts were given in class:  Goal setting handout  Class evaluation form  Low-sodium seasoning tips  Stress management handout  Your patient has established the following 4 month goals for diabetes self-care:  Be active 30 minutes or more 4 times a week  Learn how to say "no" and focus more on myself  Test glucose BID  Your patient has identified these potential barriers to change:  Not being able to afford gym membership  Feeling like I'm letting people down if i don't do whatever they're asking  Your patient has identified their diabetes self-care support plan as:  [facility] support group   Follow-Up Plan: Patient was offered a 4 month follow-up visit for diabetes self-management education.

## 2013-09-05 ENCOUNTER — Ambulatory Visit (INDEPENDENT_AMBULATORY_CARE_PROVIDER_SITE_OTHER): Payer: 59 | Admitting: Internal Medicine

## 2013-09-05 ENCOUNTER — Encounter: Payer: Self-pay | Admitting: Internal Medicine

## 2013-09-05 VITALS — BP 130/90 | HR 72 | Temp 98.3°F | Resp 20 | Wt 199.0 lb

## 2013-09-05 DIAGNOSIS — I10 Essential (primary) hypertension: Secondary | ICD-10-CM

## 2013-09-05 DIAGNOSIS — R7309 Other abnormal glucose: Secondary | ICD-10-CM

## 2013-09-05 DIAGNOSIS — Z23 Encounter for immunization: Secondary | ICD-10-CM

## 2013-09-05 DIAGNOSIS — R7302 Impaired glucose tolerance (oral): Secondary | ICD-10-CM

## 2013-09-05 DIAGNOSIS — E669 Obesity, unspecified: Secondary | ICD-10-CM

## 2013-09-05 MED ORDER — LOSARTAN POTASSIUM 50 MG PO TABS: 50.0000 mg | ORAL_TABLET | Freq: Every day | ORAL | Status: DC

## 2013-09-05 NOTE — Patient Instructions (Signed)
Limit your sodium (Salt) intake    It is important that you exercise regularly, at least 20 minutes 3 to 4 times per week.  If you develop chest pain or shortness of breath seek  medical attention.  You need to lose weight.  Consider a lower calorie diet and regular exercise.  Return in 3 months for follow-up  

## 2013-09-05 NOTE — Progress Notes (Signed)
Subjective:    Patient ID: Katie Morse, female    DOB: [birth year], 47 y.o.   MRN: [id number]  HPI  47 year old patient who has a history of impaired glucose tolerance. She was seen by OB/GYN last month with worsening hyperglycemia. Diuretic therapy discontinued and the patient is now on losartan for blood pressure control. She has been referred to nutrition and has attempted to do to a better diet. She has hypertension and a history of palpitations. Otherwise she feels well today.  Past Medical History  Diagnosis Date  . ALLERGIC RHINITIS 08/20/2008  . DYSPNEA 11/12/2008  . Headache(784.0) 08/20/2008  . HYPERTENSION 08/20/2008  . LEIOMYOMA, UTERUS 08/20/2008  . PALPITATIONS, RECURRENT 11/12/2008  . Sickle-cell trait 08/20/2008  . SYSTOLIC MURMUR 11/12/2008  . Diabetes mellitus without complication     History   Social History  . Marital Status: Married    Spouse Name: N/A    Number of Children: N/A  . Years of Education: N/A   Occupational History  . Not on file.   Social History Main Topics  . Smoking status: Never Smoker   . Smokeless tobacco: Never Used  . Alcohol Use: Yes     Comment: rarely  . Drug Use: No  . Sexual Activity: Not on file   Other Topics Concern  . Not on file   Social History Narrative  . No narrative on file    Past Surgical History  Procedure Laterality Date  . Myomectomy      Family History  Problem Relation Age of Onset  . Hypertension Mother   . Cancer Father     lung and prostate ca  . Hypertension Sister   . Diabetes Other     Allergies  Allergen Reactions  . Latex     Current Outpatient Prescriptions on File Prior to Visit  Medication Sig Dispense Refill  . Cranberry 1000 MG CAPS Take by mouth.      . FeFum-FePoly-FA-B Cmp-C-Biot (INTEGRA PLUS) CAPS Take 1 capsule by mouth daily.  90 capsule  2  . Prenatal Vit-Fe Fumarate-FA (PRENATAL MULTIVITAMIN) TABS Take 1 tablet by mouth daily at 12 noon.      . vitamin  B-12 (CYANOCOBALAMIN) 100 MCG tablet Take 50 mcg by mouth daily.      . vitamin C (ASCORBIC ACID) 500 MG tablet Take 500 mg by mouth daily.      . vitamin E 100 UNIT capsule Take 100 Units by mouth daily.      . [DISCONTINUED] lisinopril-hydrochlorothiazide (ZESTORETIC) 20-12.5 MG per tablet Take 1 tablet by mouth daily.  90 tablet  3   No current facility-administered medications on file prior to visit.    BP 130/90  Pulse 72  Temp(Src) 98.3 F (36.8 C) (Oral)  Resp 20  Wt 199 lb (90.266 kg)  BMI 35.8 kg/m2  SpO2 98%  LMP 01/06/2013       Review of Systems  Constitutional: Negative.   HENT: Negative for hearing loss, congestion, sore throat, rhinorrhea, dental problem, sinus pressure and tinnitus.   Eyes: Negative for pain, discharge and visual disturbance.  Respiratory: Negative for cough and shortness of breath.   Cardiovascular: Negative for chest pain, palpitations and leg swelling.  Gastrointestinal: Negative for nausea, vomiting, abdominal pain, diarrhea, constipation, blood in stool and abdominal distention.  Genitourinary: Negative for dysuria, urgency, frequency, hematuria, flank pain, vaginal bleeding, vaginal discharge, difficulty urinating, vaginal pain and pelvic pain.  Musculoskeletal: Negative for joint swelling, arthralgias and gait problem.  Skin: Negative for rash.  Neurological: Negative for dizziness, syncope, speech difficulty, weakness, numbness and headaches.  Hematological: Negative for adenopathy.  Psychiatric/Behavioral: Negative for behavioral problems, dysphoric mood and agitation. The patient is not nervous/anxious.        Objective:   Physical Exam  Constitutional: She is oriented to person, place, and time. She appears well-developed and well-nourished.  Blood pressure 130/90  HENT:  Head: Normocephalic.  Right Ear: External ear normal.  Left Ear: External ear normal.  Mouth/Throat: Oropharynx is clear and moist.  Eyes: Conjunctivae and  EOM are normal. Pupils are equal, round, and reactive to light.  Neck: Normal range of motion. Neck supple. No thyromegaly present.  Cardiovascular: Normal rate, regular rhythm, normal heart sounds and intact distal pulses.   Pulmonary/Chest: Effort normal and breath sounds normal.  Abdominal: Soft. Bowel sounds are normal. She exhibits no mass. There is no tenderness.  Musculoskeletal: Normal range of motion.  Lymphadenopathy:    She has no cervical adenopathy.  Neurological: She is alert and oriented to person, place, and time.  Skin: Skin is warm and dry. No rash noted.  Psychiatric: She has a normal mood and affect. Her behavior is normal.          Assessment & Plan:   Hypertension.  Lifestyle issues discussed at length. Hopefully will result in better blood pressure control. Will recheck in 3 months. Home blood pressure monitoring encouraged Impaired glucose tolerance. Diuretic therapy has been discontinued weight loss exercise encouraged. We'll check a hemoglobin A1c in 3 months

## 2013-12-05 ENCOUNTER — Ambulatory Visit: Payer: 59 | Admitting: Internal Medicine

## 2013-12-11 ENCOUNTER — Ambulatory Visit: Payer: 59 | Admitting: Internal Medicine

## 2013-12-26 ENCOUNTER — Encounter: Payer: 59 | Attending: Internal Medicine | Admitting: *Deleted

## 2013-12-26 ENCOUNTER — Encounter: Payer: Self-pay | Admitting: *Deleted

## 2013-12-26 VITALS — Ht 62.5 in | Wt 198.8 lb

## 2013-12-26 DIAGNOSIS — R7309 Other abnormal glucose: Secondary | ICD-10-CM | POA: Insufficient documentation

## 2013-12-26 DIAGNOSIS — Z713 Dietary counseling and surveillance: Secondary | ICD-10-CM | POA: Insufficient documentation

## 2013-12-26 DIAGNOSIS — R7302 Impaired glucose tolerance (oral): Secondary | ICD-10-CM

## 2013-12-26 NOTE — Patient Instructions (Addendum)
PLAN: Test glucose FBS once weekly and 2hpp once weekly and record. Return to the Gym for exercise. Goal of 150 minutes per week. Approach gradually. Continue to Utilize the knowledge that you have to make good decisions in relationship to portion control Continue taking medication as directed. Consider using [employer] rather than butter. Avoid fried foods and take the skin off the chicken

## 2013-12-26 NOTE — Progress Notes (Signed)
  Patient was seen on 12/26/13 for her 4 month follow-up as a part of the diabetes self-management courses at the [facility].   Patient self reports the following: November 14, 2013 had check up with Dr. Chalmers Cater, A1c, cholesterol and BP were elevated. Started on Metformin and change of HTN medication. Patient had not lost as much weight had been hoped. Does not test glucose but once weekly. Does not recall the last reading glucose reading nor A1c. Has trouble sleeping at night. Goes to sleep @0300 , gets up to take foster son to the bus in the morning around 0600. She goes back home to sleep from 0730 to 0900. Has not discussed the option of medication to assist in sleep.  Diabetes control has improved since diabetes self-management training: yes, she feels she has the knowledge but had gotten "off track". Is not back on track! Number of days blood glucose is >200: none, Recall of FBS reading is <100 Last MD appointment for diabetes: November 2014 Changes in treatment plan: Increase testing glucose twice weekly Confidence with ability to manage diabetes: High Areas for improvement with diabetes self-care: exercise Willingness to participate in diabetes support group: not at this time  PLAN: Test glucose FBS once weekly and 2hpp once weekly and record. Return to the Gym for exercise. Goal of 150 minutes per week. Approach gradually. Continue to Utilize the knowledge that you have to make good decisions in relationship to portion control Continue taking medication as directed. Consider using [employer] Shark rather than butter. Avoid fried foods and take the skin off the chicken Ok to have Diet Soda occasionally. Prefer decaffeinated.  Artificial sweeteners: Splenda, Stevia, Truvia all OK  THERE IS NOTHING THAT YOU CAN'T EAT.Marland KitchenMarland KitchenMake GOOD CHOICES AND PORTION CONTROL  Follow-Up Plan: Patient to call and schedule as needed.

## 2014-01-15 ENCOUNTER — Ambulatory Visit: Payer: 59 | Admitting: Internal Medicine

## 2014-01-24 ENCOUNTER — Encounter: Payer: Self-pay | Admitting: Internal Medicine

## 2014-01-24 ENCOUNTER — Ambulatory Visit (INDEPENDENT_AMBULATORY_CARE_PROVIDER_SITE_OTHER): Payer: 59 | Admitting: Internal Medicine

## 2014-01-24 VITALS — BP 140/90 | HR 82 | Temp 98.2°F | Resp 20 | Ht 62.5 in | Wt 196.0 lb

## 2014-01-24 DIAGNOSIS — R7302 Impaired glucose tolerance (oral): Secondary | ICD-10-CM

## 2014-01-24 DIAGNOSIS — R7309 Other abnormal glucose: Secondary | ICD-10-CM

## 2014-01-24 DIAGNOSIS — I10 Essential (primary) hypertension: Secondary | ICD-10-CM

## 2014-01-24 LAB — HEMOGLOBIN A1C: Hgb A1c MFr Bld: 6.4 % (ref 4.6–6.5)

## 2014-01-24 NOTE — Progress Notes (Signed)
Subjective:    Patient ID: Katie Morse, female    DOB: Nov 10, 1966, 48 y.o.   MRN: [id number]  HPI  48 year old patient who is seen today for followup of hypertension and diabetes. She now is on metformin 500 mg twice a day. Diuretic therapy has been discontinued and blood pressure is now managed with losartan. She feels well today. There has been some modest weight loss since her last visit. She has received dietary counseling.  Past Medical History  Diagnosis Date  . ALLERGIC RHINITIS 08/20/2008  . DYSPNEA 11/12/2008  . Headache(784.0) 08/20/2008  . HYPERTENSION 08/20/2008  . LEIOMYOMA, UTERUS 08/20/2008  . PALPITATIONS, RECURRENT 11/12/2008  . Sickle-cell trait 08/20/2008  . SYSTOLIC MURMUR 22/01/5426  . Diabetes mellitus without complication     History   Social History  . Marital Status: Married    Spouse Name: N/A    Number of Children: N/A  . Years of Education: N/A   Occupational History  . Not on file.   Social History Main Topics  . Smoking status: Never Smoker   . Smokeless tobacco: Never Used  . Alcohol Use: Yes     Comment: rarely  . Drug Use: No  . Sexual Activity: Not on file   Other Topics Concern  . Not on file   Social History Narrative  . No narrative on file    Past Surgical History  Procedure Laterality Date  . Myomectomy      Family History  Problem Relation Age of Onset  . Hypertension Mother   . Cancer Father     lung and prostate ca  . Hypertension Sister   . Diabetes Other     Allergies  Allergen Reactions  . Latex     Current Outpatient Prescriptions on File Prior to Visit  Medication Sig Dispense Refill  . Cranberry 1000 MG CAPS Take by mouth.      . FeFum-FePoly-FA-B Cmp-C-Biot (INTEGRA PLUS) CAPS Take 1 capsule by mouth daily.  90 capsule  2  . losartan (COZAAR) 50 MG tablet Take 1 tablet (50 mg total) by mouth daily.  90 tablet  4  . metFORMIN (GLUMETZA) 500 MG (MOD) 24 hr tablet Take 500 mg by mouth 2 (two)  times daily with a meal.      . Prenatal Vit-Fe Fumarate-FA (PRENATAL MULTIVITAMIN) TABS Take 1 tablet by mouth daily at 12 noon.      . vitamin B-12 (CYANOCOBALAMIN) 100 MCG tablet Take 50 mcg by mouth daily.      . vitamin C (ASCORBIC ACID) 500 MG tablet Take 500 mg by mouth daily.      . vitamin E 100 UNIT capsule Take 100 Units by mouth daily.      . [DISCONTINUED] lisinopril-hydrochlorothiazide (ZESTORETIC) 20-12.5 MG per tablet Take 1 tablet by mouth daily.  90 tablet  3   No current facility-administered medications on file prior to visit.    BP 140/90  Pulse 82  Temp(Src) 98.2 F (36.8 C) (Oral)  Resp 20  Ht 5' 2.5" (1.588 m)  Wt 196 lb (88.905 kg)  BMI 35.26 kg/m2  SpO2 97%  LMP 01/19/2013       Review of Systems  Constitutional: Negative.   HENT: Negative for congestion, dental problem, hearing loss, rhinorrhea, sinus pressure, sore throat and tinnitus.   Eyes: Negative for pain, discharge and visual disturbance.  Respiratory: Negative for cough and shortness of breath.   Cardiovascular: Negative for chest pain, palpitations and leg swelling.  Gastrointestinal: Negative for nausea, vomiting, abdominal pain, diarrhea, constipation, blood in stool and abdominal distention.  Genitourinary: Negative for dysuria, urgency, frequency, hematuria, flank pain, vaginal bleeding, vaginal discharge, difficulty urinating, vaginal pain and pelvic pain.  Musculoskeletal: Negative for arthralgias, gait problem and joint swelling.  Skin: Negative for rash.  Neurological: Negative for dizziness, syncope, speech difficulty, weakness, numbness and headaches.  Hematological: Negative for adenopathy.  Psychiatric/Behavioral: Negative for behavioral problems, dysphoric mood and agitation. The patient is not nervous/anxious.        Objective:   Physical Exam  Constitutional: She is oriented to person, place, and time. She appears well-developed and well-nourished.  Blood pressure 140/80   HENT:  Head: Normocephalic.  Right Ear: External ear normal.  Left Ear: External ear normal.  Mouth/Throat: Oropharynx is clear and moist.  Eyes: Conjunctivae and EOM are normal. Pupils are equal, round, and reactive to light.  Neck: Normal range of motion. Neck supple. No thyromegaly present.  Cardiovascular: Normal rate, regular rhythm, normal heart sounds and intact distal pulses.   Pulmonary/Chest: Effort normal and breath sounds normal.  Abdominal: Soft. Bowel sounds are normal. She exhibits no mass. There is no tenderness.  Musculoskeletal: Normal range of motion.  Lymphadenopathy:    She has no cervical adenopathy.  Neurological: She is alert and oriented to person, place, and time.  Skin: Skin is warm and dry. No rash noted.  Psychiatric: She has a normal mood and affect. Her behavior is normal.          Assessment & Plan:   Hypertension. Reasonable control. Exercise weight loss low salt diet also encouraged Diagnosis type II. We'll check a hemoglobin A1c. Continue aggressive nonpharmacologic measures.

## 2014-01-24 NOTE — Progress Notes (Signed)
Pre-visit discussion using our clinic review tool. No additional management support is needed unless otherwise documented below in the visit note.  

## 2014-01-24 NOTE — Patient Instructions (Signed)
Limit your sodium (Salt) intake   Please check your hemoglobin A1c every 3 months    It is important that you exercise regularly, at least 20 minutes 3 to 4 times per week.  If you develop chest pain or shortness of breath seek  medical attention.  You need to lose weight.  Consider a lower calorie diet and regular exercise. 

## 2014-01-26 ENCOUNTER — Telehealth: Payer: Self-pay | Admitting: Internal Medicine

## 2014-01-26 NOTE — Telephone Encounter (Signed)
Relevant patient education assigned to patient using Emmi. ° °

## 2014-03-30 ENCOUNTER — Other Ambulatory Visit (INDEPENDENT_AMBULATORY_CARE_PROVIDER_SITE_OTHER): Payer: 59

## 2014-03-30 DIAGNOSIS — Z0000 Encounter for general adult medical examination without abnormal findings: Secondary | ICD-10-CM

## 2014-03-30 LAB — CBC WITH DIFFERENTIAL/PLATELET
BASOS ABS: 0 10*3/uL (ref 0.0–0.1)
Basophils Relative: 0.7 % (ref 0.0–3.0)
EOS ABS: 0.1 10*3/uL (ref 0.0–0.7)
Eosinophils Relative: 2 % (ref 0.0–5.0)
HCT: 37.8 % (ref 36.0–46.0)
Hemoglobin: 12.5 g/dL (ref 12.0–15.0)
LYMPHS PCT: 29.9 % (ref 12.0–46.0)
Lymphs Abs: 1.4 10*3/uL (ref 0.7–4.0)
MCHC: 33 g/dL (ref 30.0–36.0)
MCV: 82 fl (ref 78.0–100.0)
MONOS PCT: 8.2 % (ref 3.0–12.0)
Monocytes Absolute: 0.4 10*3/uL (ref 0.1–1.0)
Neutro Abs: 2.7 10*3/uL (ref 1.4–7.7)
Neutrophils Relative %: 59.2 % (ref 43.0–77.0)
PLATELETS: 277 10*3/uL (ref 150.0–400.0)
RBC: 4.62 Mil/uL (ref 3.87–5.11)
RDW: 14.8 % — AB (ref 11.5–14.6)
WBC: 4.6 10*3/uL (ref 4.5–10.5)

## 2014-03-30 LAB — MICROALBUMIN / CREATININE URINE RATIO
Creatinine,U: 91.9 mg/dL
MICROALB/CREAT RATIO: 1.3 mg/g (ref 0.0–30.0)
Microalb, Ur: 1.2 mg/dL (ref 0.0–1.9)

## 2014-03-30 LAB — POCT URINALYSIS DIPSTICK
BILIRUBIN UA: NEGATIVE
Blood, UA: NEGATIVE
Glucose, UA: NEGATIVE
KETONES UA: NEGATIVE
Leukocytes, UA: NEGATIVE
Nitrite, UA: NEGATIVE
PROTEIN UA: NEGATIVE
SPEC GRAV UA: 1.015
Urobilinogen, UA: 0.2
pH, UA: 8.5

## 2014-03-30 LAB — HEPATIC FUNCTION PANEL
ALBUMIN: 4 g/dL (ref 3.5–5.2)
ALK PHOS: 66 U/L (ref 39–117)
ALT: 19 U/L (ref 0–35)
AST: 17 U/L (ref 0–37)
BILIRUBIN DIRECT: 0 mg/dL (ref 0.0–0.3)
Total Bilirubin: 0.7 mg/dL (ref 0.3–1.2)
Total Protein: 8.1 g/dL (ref 6.0–8.3)

## 2014-03-30 LAB — LIPID PANEL
CHOLESTEROL: 202 mg/dL — AB (ref 0–200)
HDL: 60.1 mg/dL (ref >39.00)
LDL Cholesterol: 128 mg/dL — ABNORMAL HIGH (ref 0–99)
Total CHOL/HDL Ratio: 3
Triglycerides: 68 mg/dL (ref 0.0–149.0)
VLDL: 13.6 mg/dL (ref 0.0–40.0)

## 2014-03-30 LAB — BASIC METABOLIC PANEL
BUN: 8 mg/dL (ref 6–23)
CALCIUM: 9.8 mg/dL (ref 8.4–10.5)
CO2: 27 mEq/L (ref 19–32)
CREATININE: 0.6 mg/dL (ref 0.4–1.2)
Chloride: 103 mEq/L (ref 96–112)
GFR: 145.04 mL/min (ref >60.00)
GLUCOSE: 110 mg/dL — AB (ref 70–99)
Potassium: 3.5 mEq/L (ref 3.5–5.1)
Sodium: 138 mEq/L (ref 135–145)

## 2014-03-30 LAB — HEMOGLOBIN A1C: Hgb A1c MFr Bld: 6.6 % — ABNORMAL HIGH (ref 4.6–6.5)

## 2014-03-30 LAB — TSH: TSH: 0.55 u[IU]/mL (ref 0.35–5.50)

## 2014-04-06 ENCOUNTER — Encounter: Payer: Self-pay | Admitting: Internal Medicine

## 2014-04-06 ENCOUNTER — Ambulatory Visit (INDEPENDENT_AMBULATORY_CARE_PROVIDER_SITE_OTHER): Payer: 59 | Admitting: Internal Medicine

## 2014-04-06 VITALS — BP 140/90 | HR 93 | Temp 98.9°F | Resp 20 | Ht 62.0 in | Wt 189.0 lb

## 2014-04-06 DIAGNOSIS — J309 Allergic rhinitis, unspecified: Secondary | ICD-10-CM

## 2014-04-06 DIAGNOSIS — Z0000 Encounter for general adult medical examination without abnormal findings: Secondary | ICD-10-CM

## 2014-04-06 DIAGNOSIS — I10 Essential (primary) hypertension: Secondary | ICD-10-CM

## 2014-04-06 NOTE — Progress Notes (Signed)
Pre-visit discussion using our clinic review tool. No additional management support is needed unless otherwise documented below in the visit note.  

## 2014-04-06 NOTE — Progress Notes (Signed)
Subjective:    Patient ID: Katie Morse, female    DOB: 18-Feb-1966, 48 y.o.   MRN: [id number]  HPI  48 year old patient who is seen today for a preventive health examination.  She is followed by gynecology.  She is doing quite well without concerns or complaints.  She has type 2 diabetes, controlled on metformin therapy as well as treated hypertension.  Current Allergies:  No known allergies   Past Medical History:   sickle cell trait  Allergic rhinitis  Headache  Hypertension  DM2  Past Surgical History:  unremarkable   Family History:  father died age 49, for lung cancer, history of prostate cancer  mother is 49, hypertension  One sister with treated hypertension  5 brothers are well  Social History:  Married Never Smoked  Games developer and sales  Risk Factors:  Tobacco use: never   Past Medical History  Diagnosis Date  . ALLERGIC RHINITIS 08/20/2008  . DYSPNEA 11/12/2008  . Headache(784.0) 08/20/2008  . HYPERTENSION 08/20/2008  . LEIOMYOMA, UTERUS 08/20/2008  . PALPITATIONS, RECURRENT 11/12/2008  . Sickle-cell trait 08/20/2008  . SYSTOLIC MURMUR 49/25/9563  . Diabetes mellitus without complication     History   Social History  . Marital Status: Married    Spouse Name: N/A    Number of Children: N/A  . Years of Education: N/A   Occupational History  . Not on file.   Social History Main Topics  . Smoking status: Never Smoker   . Smokeless tobacco: Never Used  . Alcohol Use: Yes     Comment: rarely  . Drug Use: No  . Sexual Activity: Not on file   Other Topics Concern  . Not on file   Social History Narrative  . No narrative on file    Past Surgical History  Procedure Laterality Date  . Myomectomy      Family History  Problem Relation Age of Onset  . Hypertension Mother   . Cancer Father     lung and prostate ca  . Hypertension Sister   . Diabetes Other     Allergies  Allergen Reactions  . Latex     Current  Outpatient Prescriptions on File Prior to Visit  Medication Sig Dispense Refill  . Cranberry 1000 MG CAPS Take by mouth.      . FeFum-FePoly-FA-B Cmp-C-Biot (INTEGRA PLUS) CAPS Take 1 capsule by mouth daily.  90 capsule  2  . losartan (COZAAR) 50 MG tablet Take 1 tablet (50 mg total) by mouth daily.  90 tablet  4  . metFORMIN (GLUMETZA) 500 MG (MOD) 24 hr tablet Take 500 mg by mouth 2 (two) times daily with a meal.      . Prenatal Vit-Fe Fumarate-FA (PRENATAL MULTIVITAMIN) TABS Take 1 tablet by mouth daily at 12 noon.      . vitamin B-12 (CYANOCOBALAMIN) 100 MCG tablet Take 50 mcg by mouth daily.      . vitamin C (ASCORBIC ACID) 500 MG tablet Take 500 mg by mouth daily.      . vitamin E 100 UNIT capsule Take 100 Units by mouth daily.      . [DISCONTINUED] lisinopril-hydrochlorothiazide (ZESTORETIC) 20-12.5 MG per tablet Take 1 tablet by mouth daily.  90 tablet  3   No current facility-administered medications on file prior to visit.    BP 140/90  Pulse 93  Temp(Src) 98.9 F (37.2 C) (Oral)  Resp 20  Ht 5\' 2"  (1.575 m)  Wt 189 lb (85.73  kg)  BMI 34.56 kg/m2  SpO2 98%  LMP 01/19/2013     Review of Systems  Constitutional: Negative for fever, appetite change, fatigue and unexpected weight change.  HENT: Negative for congestion, dental problem, ear pain, hearing loss, mouth sores, nosebleeds, sinus pressure, sore throat, tinnitus, trouble swallowing and voice change.   Eyes: Negative for photophobia, pain, redness and visual disturbance.  Respiratory: Negative for cough, chest tightness and shortness of breath.   Cardiovascular: Negative for chest pain, palpitations and leg swelling.  Gastrointestinal: Negative for nausea, vomiting, abdominal pain, diarrhea, constipation, blood in stool, abdominal distention and rectal pain.  Genitourinary: Negative for dysuria, urgency, frequency, hematuria, flank pain, vaginal bleeding, vaginal discharge, difficulty urinating, genital sores, vaginal  pain, menstrual problem and pelvic pain.  Musculoskeletal: Negative for arthralgias, back pain and neck stiffness.  Skin: Negative for rash.  Neurological: Negative for dizziness, syncope, speech difficulty, weakness, light-headedness, numbness and headaches.  Hematological: Negative for adenopathy. Does not bruise/bleed easily.  Psychiatric/Behavioral: Negative for suicidal ideas, behavioral problems, self-injury, dysphoric mood and agitation. The patient is not nervous/anxious.        Objective:   Physical Exam  Constitutional: She is oriented to person, place, and time. She appears well-developed and well-nourished.  HENT:  Head: Normocephalic and atraumatic.  Right Ear: External ear normal.  Left Ear: External ear normal.  Mouth/Throat: Oropharynx is clear and moist.  Eyes: Conjunctivae and EOM are normal.  Neck: Normal range of motion. Neck supple. No JVD present. No thyromegaly present.  Cardiovascular: Normal rate, regular rhythm, normal heart sounds and intact distal pulses.   No murmur heard. Pulmonary/Chest: Effort normal and breath sounds normal. She has no wheezes. She has no rales.  Abdominal: Soft. Bowel sounds are normal. She exhibits no distension and no mass. There is no tenderness. There is no rebound and no guarding.  Musculoskeletal: Normal range of motion. She exhibits no edema and no tenderness.  Neurological: She is alert and oriented to person, place, and time. She has normal reflexes. No cranial nerve deficit. She exhibits normal muscle tone. Coordination normal.  Skin: Skin is warm and dry. No rash noted.  3 cm soft freely movable noninflamed nodule involving the left posterior thigh area  Psychiatric: She has a normal mood and affect. Her behavior is normal.          Assessment & Plan:   Preventive health examination Hypertension stable Diabetes mellitus Symptomatic lipoma, or possibly sebaceous cyst, left posterior thigh.  We'll set up for a general  surgery evaluation and excision  Continue efforts at weight loss Recheck 6 months No change therapy

## 2014-04-06 NOTE — Patient Instructions (Signed)
Limit your sodium (Salt) intake   Please check your hemoglobin A1c every 3 months    It is important that you exercise regularly, at least 20 minutes 3 to 4 times per week.  If you develop chest pain or shortness of breath seek  medical attention.  Please check your blood pressure on a regular basis.  If it is consistently greater than 150/90, please make an office appointment.   Return in 6 months for follow-up

## 2014-05-23 ENCOUNTER — Telehealth: Payer: Self-pay | Admitting: Internal Medicine

## 2014-05-23 DIAGNOSIS — L729 Follicular cyst of the skin and subcutaneous tissue, unspecified: Secondary | ICD-10-CM

## 2014-05-23 NOTE — Telephone Encounter (Signed)
Pt states that dr. Raliegh Ip informed her at her last visit that he would refer her to see a sur gent to remove a cyst that is right above her left hip

## 2014-05-24 NOTE — Telephone Encounter (Signed)
Spoke to pt told her order for referral to General surgeon was done and someone will be getting in touch with her. Pt verbalized understanding.

## 2014-05-24 NOTE — Telephone Encounter (Signed)
Please advise 

## 2014-05-24 NOTE — Telephone Encounter (Signed)
Ok to refer.

## 2014-06-04 ENCOUNTER — Ambulatory Visit (INDEPENDENT_AMBULATORY_CARE_PROVIDER_SITE_OTHER): Payer: 59 | Admitting: Surgery

## 2014-06-04 ENCOUNTER — Encounter (INDEPENDENT_AMBULATORY_CARE_PROVIDER_SITE_OTHER): Payer: Self-pay | Admitting: Surgery

## 2014-06-04 VITALS — BP 134/86 | HR 86 | Resp 20 | Ht 62.0 in | Wt 192.8 lb

## 2014-06-04 DIAGNOSIS — D1739 Benign lipomatous neoplasm of skin and subcutaneous tissue of other sites: Secondary | ICD-10-CM

## 2014-06-04 DIAGNOSIS — D171 Benign lipomatous neoplasm of skin and subcutaneous tissue of trunk: Secondary | ICD-10-CM

## 2014-06-04 NOTE — Progress Notes (Signed)
Patient ID: Katie Morse, female   DOB: March 18, 1966, 48 y.o.   MRN: [id number]  No chief complaint on file.   HPI Katie Morse is a 48 y.o. female.  Patient sat request of Marletta Lor, MD For mass on medial left buttock. Been present for one year being larger. Causing discomfort. No redness or discharge. HPI  Past Medical History  Diagnosis Date  . ALLERGIC RHINITIS 08/20/2008  . DYSPNEA 11/12/2008  . Headache(784.0) 08/20/2008  . HYPERTENSION 08/20/2008  . LEIOMYOMA, UTERUS 08/20/2008  . PALPITATIONS, RECURRENT 11/12/2008  . Sickle-cell trait 08/20/2008  . SYSTOLIC MURMUR 69/67/8938  . Diabetes mellitus without complication     Past Surgical History  Procedure Laterality Date  . Myomectomy      Family History  Problem Relation Age of Onset  . Hypertension Mother   . Cancer Father     lung and prostate ca  . Hypertension Sister   . Diabetes Other   . Breast cancer Cousin     2-twins    Social History History  Substance Use Topics  . Smoking status: Never Smoker   . Smokeless tobacco: Never Used  . Alcohol Use: Yes     Comment: rarely    Allergies  Allergen Reactions  . Latex     Current Outpatient Prescriptions  Medication Sig Dispense Refill  . Cranberry 1000 MG CAPS Take by mouth.      . FeFum-FePoly-FA-B Cmp-C-Biot (INTEGRA PLUS) CAPS Take 1 capsule by mouth daily.  90 capsule  2  . losartan (COZAAR) 50 MG tablet Take 1 tablet (50 mg total) by mouth daily.  90 tablet  4  . Multiple Vitamins-Minerals (HAIR/SKIN/NAILS PO) Take 2 each by mouth daily.      . Prenatal Vit-Fe Fumarate-FA (PRENATAL MULTIVITAMIN) TABS Take 1 tablet by mouth daily at 12 noon.      . vitamin B-12 (CYANOCOBALAMIN) 100 MCG tablet Take 50 mcg by mouth daily.      . vitamin C (ASCORBIC ACID) 500 MG tablet Take 500 mg by mouth daily.      . vitamin E 100 UNIT capsule Take 100 Units by mouth daily.      . metFORMIN (GLUMETZA) 500 MG (MOD) 24 hr tablet Take  500 mg by mouth 2 (two) times daily with a meal.      . [DISCONTINUED] lisinopril-hydrochlorothiazide (ZESTORETIC) 20-12.5 MG per tablet Take 1 tablet by mouth daily.  90 tablet  3   No current facility-administered medications for this visit.    Review of Systems Review of Systems  Constitutional: Negative for fever, chills and unexpected weight change.  HENT: Negative for congestion, hearing loss, sore throat, trouble swallowing and voice change.   Eyes: Negative for visual disturbance.  Respiratory: Negative for cough and wheezing.   Cardiovascular: Negative for chest pain, palpitations and leg swelling.  Gastrointestinal: Negative for nausea, vomiting, abdominal pain, diarrhea, constipation, blood in stool, abdominal distention and anal bleeding.  Genitourinary: Negative for hematuria, vaginal bleeding and difficulty urinating.  Musculoskeletal: Negative for arthralgias.  Skin: Negative for rash and wound.  Neurological: Negative for seizures, syncope and headaches.  Hematological: Negative for adenopathy. Does not bruise/bleed easily.  Psychiatric/Behavioral: Negative for confusion.    Blood pressure 134/86, pulse 86, resp. rate 20, height 5\' 2"  (1.575 m), weight 192 lb 12.8 oz (87.454 kg), last menstrual period 01/19/2013.  Physical Exam Physical Exam  Constitutional: She appears well-developed and well-nourished.  HENT:  Head: Normocephalic and atraumatic.  Eyes: Pupils  are equal, round, and reactive to light. No scleral icterus.  Neck: Normal range of motion. Neck supple.  Cardiovascular: Normal rate and regular rhythm.   Pulmonary/Chest: Effort normal.  Musculoskeletal: Normal range of motion.  Skin:       Data Reviewed none  Assessment    4 cm left buttock lipoma    Plan    Patient desires excision.The procedure has been discussed with the patient.  Alternative therapies have been discussed with the patient.  Operative risks include bleeding,  Infection,   Organ injury,  Nerve injury,  Blood vessel injury,  DVT,  Pulmonary embolism,  Death,  And possible reoperation.  Medical management risks include worsening of present situation.  The success of the procedure is 50 -90 % at treating patients symptoms.  The patient understands and agrees to proceed.       Ruqayya Ventress A. 06/04/2014, 4:00 PM

## 2014-06-04 NOTE — Patient Instructions (Signed)

## 2014-06-19 ENCOUNTER — Other Ambulatory Visit (HOSPITAL_COMMUNITY): Payer: Self-pay | Admitting: Obstetrics and Gynecology

## 2014-06-19 DIAGNOSIS — Z1231 Encounter for screening mammogram for malignant neoplasm of breast: Secondary | ICD-10-CM

## 2014-07-09 ENCOUNTER — Ambulatory Visit (HOSPITAL_COMMUNITY)
Admission: RE | Admit: 2014-07-09 | Discharge: 2014-07-09 | Disposition: A | Discharge status: Home / Self Care | Payer: 59 | Source: Ambulatory Visit | Attending: Obstetrics and Gynecology | Admitting: Obstetrics and Gynecology

## 2014-07-09 DIAGNOSIS — Z1231 Encounter for screening mammogram for malignant neoplasm of breast: Secondary | ICD-10-CM | POA: Insufficient documentation

## 2014-07-25 ENCOUNTER — Ambulatory Visit: Payer: 59 | Admitting: Internal Medicine

## 2014-09-18 ENCOUNTER — Other Ambulatory Visit: Payer: Self-pay | Admitting: Internal Medicine

## 2014-09-26 ENCOUNTER — Ambulatory Visit (INDEPENDENT_AMBULATORY_CARE_PROVIDER_SITE_OTHER): Payer: 59 | Admitting: Internal Medicine

## 2014-09-26 ENCOUNTER — Encounter: Payer: Self-pay | Admitting: Internal Medicine

## 2014-09-26 VITALS — BP 124/80 | HR 82 | Temp 98.1°F | Resp 20 | Ht 62.0 in | Wt 197.0 lb

## 2014-09-26 DIAGNOSIS — I10 Essential (primary) hypertension: Secondary | ICD-10-CM

## 2014-09-26 DIAGNOSIS — J3089 Other allergic rhinitis: Secondary | ICD-10-CM

## 2014-09-26 NOTE — Progress Notes (Signed)
Pre visit review using our clinic review tool, if applicable. No additional management support is needed unless otherwise documented below in the visit note. 

## 2014-09-26 NOTE — Patient Instructions (Signed)

## 2014-09-26 NOTE — Progress Notes (Signed)
Subjective:    Patient ID: Katie Morse, female    DOB: [birth year], 48 y.o.   MRN: [id number]  HPI  48 year old patient who has a history of benign essential hypertension.  She has a history also of allergic rhinitis.  She has done quite well over the past 6 months.  She has had a gynecologic examination in August.  No concerns or complaints. No cardiopulmonary complaints On allergy symptoms include some mild periodic itching involving the left eye  Past Medical History  Diagnosis Date  . ALLERGIC RHINITIS 08/20/2008  . DYSPNEA 11/12/2008  . Headache(784.0) 08/20/2008  . HYPERTENSION 08/20/2008  . LEIOMYOMA, UTERUS 08/20/2008  . PALPITATIONS, RECURRENT 11/12/2008  . Sickle-cell trait 08/20/2008  . SYSTOLIC MURMUR 81/82/9937  . Diabetes mellitus without complication     History   Social History  . Marital Status: Married    Spouse Name: N/A    Number of Children: N/A  . Years of Education: N/A   Occupational History  . Not on file.   Social History Main Topics  . Smoking status: Never Smoker   . Smokeless tobacco: Never Used  . Alcohol Use: Yes     Comment: rarely  . Drug Use: No  . Sexual Activity: Not on file   Other Topics Concern  . Not on file   Social History Narrative  . No narrative on file    Past Surgical History  Procedure Laterality Date  . Myomectomy      Family History  Problem Relation Age of Onset  . Hypertension Mother   . Cancer Father     lung and prostate ca  . Hypertension Sister   . Diabetes Other   . Breast cancer Cousin     2-twins    Allergies  Allergen Reactions  . Latex     Current Outpatient Prescriptions on File Prior to Visit  Medication Sig Dispense Refill  . Cranberry 1000 MG CAPS Take by mouth.      . FeFum-FePoly-FA-B Cmp-C-Biot (INTEGRA PLUS) CAPS Take 1 capsule by mouth daily.  90 capsule  2  . losartan (COZAAR) 50 MG tablet TAKE 1 TABLET EVERY DAY  90 tablet  1  . Multiple Vitamins-Minerals  (HAIR/SKIN/NAILS PO) Take 2 each by mouth daily.      . Prenatal Vit-Fe Fumarate-FA (PRENATAL MULTIVITAMIN) TABS Take 1 tablet by mouth daily at 12 noon.      . vitamin B-12 (CYANOCOBALAMIN) 100 MCG tablet Take 50 mcg by mouth daily.      . vitamin C (ASCORBIC ACID) 500 MG tablet Take 500 mg by mouth daily.      . vitamin E 100 UNIT capsule Take 100 Units by mouth daily.      . [DISCONTINUED] lisinopril-hydrochlorothiazide (ZESTORETIC) 20-12.5 MG per tablet Take 1 tablet by mouth daily.  90 tablet  3   No current facility-administered medications on file prior to visit.    BP 124/80  Pulse 82  Temp(Src) 98.1 F (36.7 C) (Oral)  Resp 20  Ht 5\' 2"  (1.575 m)  Wt 197 lb (89.359 kg)  BMI 36.02 kg/m2  SpO2 99%  LMP 07/04/2014     Review of Systems  Constitutional: Negative.   HENT: Negative for congestion, dental problem, hearing loss, rhinorrhea, sinus pressure, sore throat and tinnitus.   Eyes: Positive for itching. Negative for pain, discharge and visual disturbance.  Respiratory: Negative for cough and shortness of breath.   Cardiovascular: Negative for chest pain, palpitations and leg swelling.  Gastrointestinal: Negative for nausea, vomiting, abdominal pain, diarrhea, constipation, blood in stool and abdominal distention.  Genitourinary: Negative for dysuria, urgency, frequency, hematuria, flank pain, vaginal bleeding, vaginal discharge, difficulty urinating, vaginal pain and pelvic pain.  Musculoskeletal: Negative for arthralgias, gait problem and joint swelling.  Skin: Negative for rash.  Neurological: Negative for dizziness, syncope, speech difficulty, weakness, numbness and headaches.  Hematological: Negative for adenopathy.  Psychiatric/Behavioral: Negative for behavioral problems, dysphoric mood and agitation. The patient is not nervous/anxious.        Objective:   Physical Exam  Constitutional: She is oriented to person, place, and time. She appears well-developed and  well-nourished.  HENT:  Head: Normocephalic.  Right Ear: External ear normal.  Left Ear: External ear normal.  Mouth/Throat: Oropharynx is clear and moist.  Eyes: Conjunctivae and EOM are normal. Pupils are equal, round, and reactive to light.  Neck: Normal range of motion. Neck supple. No thyromegaly present.  Cardiovascular: Normal rate, regular rhythm, normal heart sounds and intact distal pulses.   Pulmonary/Chest: Effort normal and breath sounds normal.  Abdominal: Soft. Bowel sounds are normal. She exhibits no mass. There is no tenderness.  Musculoskeletal: Normal range of motion.  Lymphadenopathy:    She has no cervical adenopathy.  Neurological: She is alert and oriented to person, place, and time.  Skin: Skin is warm and dry. No rash noted.  Psychiatric: She has a normal mood and affect. Her behavior is normal.          Assessment & Plan:   Hypertension well controlled Exogenous obesity Allergic rhinitis  No change in medical therapy Weight loss, exercise, encouraged Recheck 6 months or as needed

## 2014-09-26 NOTE — Progress Notes (Signed)
   Subjective:    Patient ID: Katie Morse, female    DOB: 03/31/1966, 48 y.o.   MRN: [id number]  HPI    Review of Systems     Objective:   Physical Exam        Assessment & Plan:

## 2014-09-27 ENCOUNTER — Telehealth: Payer: Self-pay | Admitting: Internal Medicine

## 2014-09-27 NOTE — Telephone Encounter (Signed)
emmi emailed °

## 2015-01-07 ENCOUNTER — Telehealth: Payer: Self-pay | Admitting: Internal Medicine

## 2015-01-07 NOTE — Telephone Encounter (Signed)
Pt notified letter faxed for [employer].

## 2015-01-07 NOTE — Telephone Encounter (Signed)
Pt called to say that she need a written letter stating that her Blood pressure does not effect her driving. 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   Letter need to be fax to the number below    [phone]

## 2015-03-21 ENCOUNTER — Other Ambulatory Visit: Payer: Self-pay | Admitting: Internal Medicine

## 2015-03-28 ENCOUNTER — Ambulatory Visit: Payer: 59 | Admitting: Internal Medicine

## 2015-04-18 ENCOUNTER — Other Ambulatory Visit: Payer: Self-pay | Admitting: Internal Medicine

## 2015-04-23 ENCOUNTER — Ambulatory Visit (INDEPENDENT_AMBULATORY_CARE_PROVIDER_SITE_OTHER): Payer: 59 | Admitting: Internal Medicine

## 2015-04-23 ENCOUNTER — Encounter: Payer: Self-pay | Admitting: Internal Medicine

## 2015-04-23 VITALS — HR 80 | Temp 98.2°F | Resp 20 | Ht 62.0 in | Wt 204.0 lb

## 2015-04-23 DIAGNOSIS — J301 Allergic rhinitis due to pollen: Secondary | ICD-10-CM | POA: Diagnosis not present

## 2015-04-23 DIAGNOSIS — I10 Essential (primary) hypertension: Secondary | ICD-10-CM | POA: Diagnosis not present

## 2015-04-23 DIAGNOSIS — R7302 Impaired glucose tolerance (oral): Secondary | ICD-10-CM

## 2015-04-23 MED ORDER — LOSARTAN POTASSIUM-HCTZ 100-12.5 MG PO TABS: 1.0000 | ORAL_TABLET | Freq: Every day | ORAL | Status: DC

## 2015-04-23 NOTE — Progress Notes (Signed)
Subjective:    Patient ID: Katie Morse, female    DOB: Aug 17, 1966, 49 y.o.   MRN: [id number]  HPI  49 year old patient who is seen today for her biannual follow-up.  She has a history of essential hypertension.  This has been controlled with losartan.  Generally she has done quite well.  There has been some weight gain over the past several months.  Generally feels well.  Remains on low-dose statin therapy for dyslipidemia.  No cardiopulmonary complaints.  Past Medical History  Diagnosis Date  . ALLERGIC RHINITIS 08/20/2008  . DYSPNEA 11/12/2008  . Headache(784.0) 08/20/2008  . HYPERTENSION 08/20/2008  . LEIOMYOMA, UTERUS 08/20/2008  . PALPITATIONS, RECURRENT 11/12/2008  . Sickle-cell trait 08/20/2008  . SYSTOLIC MURMUR 36/62/9476  . Diabetes mellitus without complication     History   Social History  . Marital Status: Married    Spouse Name: N/A  . Number of Children: N/A  . Years of Education: N/A   Occupational History  . Not on file.   Social History Main Topics  . Smoking status: Never Smoker   . Smokeless tobacco: Never Used  . Alcohol Use: Yes     Comment: rarely  . Drug Use: No  . Sexual Activity: Not on file   Other Topics Concern  . Not on file   Social History Narrative    Past Surgical History  Procedure Laterality Date  . Myomectomy      Family History  Problem Relation Age of Onset  . Hypertension Mother   . Cancer Father     lung and prostate ca  . Hypertension Sister   . Diabetes Other   . Breast cancer Cousin     2-twins    Allergies  Allergen Reactions  . Latex     Current Outpatient Prescriptions on File Prior to Visit  Medication Sig Dispense Refill  . Cranberry 1000 MG CAPS Take by mouth.    . FeFum-FePoly-FA-B Cmp-C-Biot (INTEGRA PLUS) CAPS Take 1 capsule by mouth daily. 90 capsule 2  . losartan (COZAAR) 50 MG tablet TAKE 1 TABLET EVERY DAY 90 tablet 0  . Multiple Vitamins-Minerals (HAIR/SKIN/NAILS PO) Take 2  each by mouth daily.    . Prenatal Vit-Fe Fumarate-FA (PRENATAL MULTIVITAMIN) TABS Take 1 tablet by mouth daily at 12 noon.    . vitamin B-12 (CYANOCOBALAMIN) 100 MCG tablet Take 50 y.c by mouth daily.    . vitamin C (ASCORBIC ACID) 500 MG tablet Take 500 mg by mouth daily.    . vitamin E 100 UNIT capsule Take 100 Units by mouth daily.    . [DISCONTINUED] lisinopril-hydrochlorothiazide (ZESTORETIC) 20-12.5 MG per tablet Take 1 tablet by mouth daily. 90 tablet 3   No current facility-administered medications on file prior to visit.    Pulse 80  Temp(Src) 98.2 F (36.8 C) (Oral)  Resp 20  Ht 5\' 2"  (1.575 m)  Wt 204 lb (92.534 kg)  BMI 37.30 kg/m2  SpO2 99%  LMP 07/04/2014     Review of Systems  Constitutional: Negative.   HENT: Negative for congestion, dental problem, hearing loss, rhinorrhea, sinus pressure, sore throat and tinnitus.   Eyes: Negative for pain, discharge and visual disturbance.  Respiratory: Negative for cough and shortness of breath.   Cardiovascular: Negative for chest pain, palpitations and leg swelling.  Gastrointestinal: Negative for nausea, vomiting, abdominal pain, diarrhea, constipation, blood in stool and abdominal distention.  Genitourinary: Negative for dysuria, urgency, frequency, hematuria, flank pain, vaginal bleeding, vaginal discharge,  difficulty urinating, vaginal pain and pelvic pain.  Musculoskeletal: Negative for joint swelling, arthralgias and gait problem.  Skin: Negative for rash.  Neurological: Negative for dizziness, syncope, speech difficulty, weakness, numbness and headaches.  Hematological: Negative for adenopathy.  Psychiatric/Behavioral: Negative for behavioral problems, dysphoric mood and agitation. The patient is not nervous/anxious.        Objective:   Physical Exam  Constitutional: She is oriented to person, place, and time. She appears well-developed and well-nourished.  Blood pressure 150/90  HENT:  Head: Normocephalic.    Right Ear: External ear normal.  Left Ear: External ear normal.  Mouth/Throat: Oropharynx is clear and moist.  Eyes: Conjunctivae and EOM are normal. Pupils are equal, round, and reactive to light.  Neck: Normal range of motion. Neck supple. No thyromegaly present.  Cardiovascular: Normal rate, regular rhythm, normal heart sounds and intact distal pulses.   Pulmonary/Chest: Effort normal and breath sounds normal.  Abdominal: Soft. Bowel sounds are normal. She exhibits no mass. There is no tenderness.  Musculoskeletal: Normal range of motion.  Lymphadenopathy:    She has no cervical adenopathy.  Neurological: She is alert and oriented to person, place, and time.  Skin: Skin is warm and dry. No rash noted.  Psychiatric: She has a normal mood and affect. Her behavior is normal.          Assessment & Plan:   Hypertension.  Suboptimal control.  Will add hydrochlorothiazide 12 point 5 mg to her losartan.  Low-salt diet, weight loss exercise all encouraged.  Home blood pressure monitoring recommended Dyslipidemia.  Continue statin therapy  Recheck 6 months

## 2015-04-23 NOTE — Progress Notes (Signed)
   Subjective:    Patient ID: Katie Morse, female    DOB: [birth year], 49 y.o.   MRN: [id number]  HPI  Wt Readings from Last 3 Encounters:  04/23/15 204 lb (92.534 kg)  09/26/14 197 lb (89.359 kg)  06/04/14 192 lb 12.8 oz (87.454 kg)    Review of Systems     Objective:   Physical Exam        Assessment & Plan:

## 2015-04-23 NOTE — Patient Instructions (Signed)
Limit your sodium (Salt) intake  Please check your blood pressure on a regular basis.  If it is consistently greater than 150/90, please make an office appointment.    It is important that you exercise regularly, at least 20 minutes 3 to 4 times per week.  If you develop chest pain or shortness of breath seek  medical attention.  Return in 6 months for follow-up  DASH Eating Plan DASH stands for "Dietary Approaches to Stop Hypertension." The DASH eating plan is a healthy eating plan that has been shown to reduce high blood pressure (hypertension). Additional health benefits may include reducing the risk of type 2 diabetes mellitus, heart disease, and stroke. The DASH eating plan may also help with weight loss. WHAT DO I NEED TO KNOW ABOUT THE DASH EATING PLAN? For the DASH eating plan, you will follow these general guidelines:  Choose foods with a percent daily value for sodium of less than 5% (as listed on the food label).  Use salt-free seasonings or herbs instead of table salt or sea salt.  Check with your health care provider or pharmacist before using salt substitutes.  Eat lower-sodium products, often labeled as "lower sodium" or "no salt added."  Eat fresh foods.  Eat more vegetables, fruits, and low-fat dairy products.  Choose whole grains. Look for the word "whole" as the first word in the ingredient list.  Choose fish and skinless chicken or [place] more often than red meat. Limit fish, poultry, and meat to 6 oz (170 g) each day.  Limit sweets, desserts, sugars, and sugary drinks.  Choose heart-healthy fats.  Limit cheese to 1 oz (28 g) per day.  Eat more home-cooked food and less restaurant, buffet, and fast food.  Limit fried foods.  Cook foods using methods other than frying.  Limit canned vegetables. If you do use them, rinse them well to decrease the sodium.  When eating at a restaurant, ask that your food be prepared with less salt, or no salt if  possible. WHAT FOODS CAN I EAT? Seek help from a dietitian for individual calorie needs. Grains Whole grain or whole wheat bread. Brown rice. Whole grain or whole wheat pasta. Quinoa, bulgur, and whole grain cereals. Low-sodium cereals. Corn or whole wheat flour tortillas. Whole grain cornbread. Whole grain crackers. Low-sodium crackers. Vegetables Fresh or frozen vegetables (raw, steamed, roasted, or grilled). Low-sodium or reduced-sodium tomato and vegetable juices. Low-sodium or reduced-sodium tomato sauce and paste. Low-sodium or reduced-sodium canned vegetables.  Fruits All fresh, canned (in natural juice), or frozen fruits. Meat and Other Protein Products Ground beef (85% or leaner), grass-fed beef, or beef trimmed of fat. Skinless chicken or [place]. Ground chicken or [place]. Pork trimmed of fat. All fish and seafood. Eggs. Dried beans, peas, or lentils. Unsalted nuts and seeds. Unsalted canned beans. Dairy Low-fat dairy products, such as skim or 1% milk, 2% or reduced-fat cheeses, low-fat ricotta or cottage cheese, or plain low-fat yogurt. Low-sodium or reduced-sodium cheeses. Fats and Oils Tub margarines without trans fats. Light or reduced-fat mayonnaise and salad dressings (reduced sodium). Avocado. Safflower, olive, or canola oils. Natural peanut or almond butter. Other Unsalted popcorn and pretzels. The items listed above may not be a complete list of recommended foods or beverages. Contact your dietitian for more options. WHAT FOODS ARE NOT RECOMMENDED? Grains White bread. White pasta. White rice. Refined cornbread. Bagels and croissants. Crackers that contain trans fat. Vegetables Creamed or fried vegetables. Vegetables in a cheese sauce. Regular canned vegetables. Regular  canned tomato sauce and paste. Regular tomato and vegetable juices. Fruits Dried fruits. Canned fruit in light or heavy syrup. Fruit juice. Meat and Other Protein Products Fatty cuts of meat. Ribs, chicken  wings, bacon, sausage, bologna, salami, chitterlings, fatback, hot dogs, bratwurst, and packaged luncheon meats. Salted nuts and seeds. Canned beans with salt. Dairy Whole or 2% milk, cream, half-and-half, and cream cheese. Whole-fat or sweetened yogurt. Full-fat cheeses or blue cheese. Nondairy creamers and whipped toppings. Processed cheese, cheese spreads, or cheese curds. Condiments Onion and garlic salt, seasoned salt, table salt, and sea salt. Canned and packaged gravies. Worcestershire sauce. Tartar sauce. Barbecue sauce. Teriyaki sauce. Soy sauce, including reduced sodium. Steak sauce. Fish sauce. Oyster sauce. Cocktail sauce. Horseradish. Ketchup and mustard. Meat flavorings and tenderizers. Bouillon cubes. Hot sauce. Tabasco sauce. Marinades. Taco seasonings. Relishes. Fats and Oils Butter, stick margarine, lard, shortening, ghee, and bacon fat. Coconut, palm kernel, or palm oils. Regular salad dressings. Other Pickles and olives. Salted popcorn and pretzels. The items listed above may not be a complete list of foods and beverages to avoid. Contact your dietitian for more information. WHERE CAN I FIND MORE INFORMATION? National Heart, Lung, and Blood Institute: travelstabloid.com Document Released: 11/26/2011 Document Revised: 04/23/2014 Document Reviewed: 10/11/2013 [facility] Patient Information 2015 [place], [place]. This information is not intended to replace advice given to you by your health care provider. Make sure you discuss any questions you have with your health care provider.

## 2015-04-23 NOTE — Progress Notes (Signed)
Pre visit review using our clinic review tool, if applicable. No additional management support is needed unless otherwise documented below in the visit note. 

## 2015-05-10 ENCOUNTER — Telehealth: Payer: Self-pay | Admitting: Internal Medicine

## 2015-05-10 NOTE — Telephone Encounter (Signed)
error 

## 2015-05-17 ENCOUNTER — Encounter: Payer: Self-pay | Admitting: Internal Medicine

## 2015-05-17 ENCOUNTER — Ambulatory Visit (INDEPENDENT_AMBULATORY_CARE_PROVIDER_SITE_OTHER): Payer: 59 | Admitting: Internal Medicine

## 2015-05-17 VITALS — BP 110/80 | HR 81 | Temp 98.5°F | Resp 20 | Ht 62.0 in | Wt 205.0 lb

## 2015-05-17 DIAGNOSIS — R5383 Other fatigue: Secondary | ICD-10-CM

## 2015-05-17 DIAGNOSIS — I10 Essential (primary) hypertension: Secondary | ICD-10-CM | POA: Diagnosis not present

## 2015-05-17 DIAGNOSIS — R7302 Impaired glucose tolerance (oral): Secondary | ICD-10-CM

## 2015-05-17 LAB — CBC WITH DIFFERENTIAL/PLATELET
BASOS PCT: 0.6 % (ref 0.0–3.0)
Basophils Absolute: 0 10*3/uL (ref 0.0–0.1)
EOS ABS: 0.1 10*3/uL (ref 0.0–0.7)
EOS PCT: 2.5 % (ref 0.0–5.0)
HCT: 36.4 % (ref 36.0–46.0)
Hemoglobin: 12.2 g/dL (ref 12.0–15.0)
LYMPHS PCT: 32.4 % (ref 12.0–46.0)
Lymphs Abs: 1.6 10*3/uL (ref 0.7–4.0)
MCHC: 33.6 g/dL (ref 30.0–36.0)
MCV: 79.9 fl (ref 78.0–100.0)
MONOS PCT: 8.3 % (ref 3.0–12.0)
Monocytes Absolute: 0.4 10*3/uL (ref 0.1–1.0)
Neutro Abs: 2.8 10*3/uL (ref 1.4–7.7)
Neutrophils Relative %: 56.2 % (ref 43.0–77.0)
Platelets: 264 10*3/uL (ref 150.0–400.0)
RBC: 4.55 Mil/uL (ref 3.87–5.11)
RDW: 14.7 % (ref 11.5–15.5)
WBC: 5 10*3/uL (ref 4.0–10.5)

## 2015-05-17 LAB — COMPREHENSIVE METABOLIC PANEL
ALT: 25 U/L (ref 0–35)
AST: 20 U/L (ref 0–37)
Albumin: 4.2 g/dL (ref 3.5–5.2)
Alkaline Phosphatase: 76 U/L (ref 39–117)
BILIRUBIN TOTAL: 0.4 mg/dL (ref 0.2–1.2)
BUN: 10 mg/dL (ref 6–23)
CALCIUM: 9.7 mg/dL (ref 8.4–10.5)
CO2: 31 meq/L (ref 19–32)
Chloride: 100 mEq/L (ref 96–112)
Creatinine, Ser: 0.62 mg/dL (ref 0.40–1.20)
GFR: 131.02 mL/min (ref >60.00)
GLUCOSE: 115 mg/dL — AB (ref 70–99)
Potassium: 3.8 mEq/L (ref 3.5–5.1)
Sodium: 138 mEq/L (ref 135–145)
Total Protein: 8 g/dL (ref 6.0–8.3)

## 2015-05-17 LAB — HEMOGLOBIN A1C: Hgb A1c MFr Bld: 6.6 % — ABNORMAL HIGH (ref 4.6–6.5)

## 2015-05-17 LAB — TSH: TSH: 1.36 u[IU]/mL (ref 0.35–4.50)

## 2015-05-17 MED ORDER — POTASSIUM CHLORIDE CRYS ER 20 MEQ PO TBCR: 20.0000 meq | EXTENDED_RELEASE_TABLET | Freq: Every day | ORAL | Status: DC

## 2015-05-17 NOTE — Progress Notes (Signed)
Pre visit review using our clinic review tool, if applicable. No additional management support is needed unless otherwise documented below in the visit note. 

## 2015-05-17 NOTE — Patient Instructions (Signed)
Limit your sodium (Salt) intake    It is important that you exercise regularly, at least 20 minutes 3 to 4 times per week.  If you develop chest pain or shortness of breath seek  medical attention.  You need to lose weight.  Consider a lower calorie diet and regular exercise.  Return in 6 months for follow-up   

## 2015-05-17 NOTE — Progress Notes (Signed)
Subjective:    Patient ID: Katie Morse, female    DOB: [birth year], 49 y.o.   MRN: [id number]  HPI 49 year old patient who is seen today for follow-up of her hypertension.  Hydrochlorothiazide 25 mg was added to her regimen about one month ago.  Blood pressure has been much better controlled.  Over the past couple weeks she has had some increasing fatigue.  No recent lab.  Wt Readings from Last 3 Encounters:  05/17/15 205 lb (92.987 kg)  04/23/15 204 lb (92.534 kg)  09/26/14 197 lb (89.359 kg)    Past Medical History  Diagnosis Date  . ALLERGIC RHINITIS 08/20/2008  . DYSPNEA 11/12/2008  . Headache(784.0) 08/20/2008  . HYPERTENSION 08/20/2008  . LEIOMYOMA, UTERUS 08/20/2008  . PALPITATIONS, RECURRENT 11/12/2008  . Sickle-cell trait 08/20/2008  . SYSTOLIC MURMUR 35/57/3220  . Diabetes mellitus without complication     History   Social History  . Marital Status: Married    Spouse Name: N/A  . Number of Children: N/A  . Years of Education: N/A   Occupational History  . Not on file.   Social History Main Topics  . Smoking status: Never Smoker   . Smokeless tobacco: Never Used  . Alcohol Use: Yes     Comment: rarely  . Drug Use: No  . Sexual Activity: Not on file   Other Topics Concern  . Not on file   Social History Narrative    Past Surgical History  Procedure Laterality Date  . Myomectomy      Family History  Problem Relation Age of Onset  . Hypertension Mother   . Cancer Father     lung and prostate ca  . Hypertension Sister   . Diabetes Other   . Breast cancer Cousin     2-twins    Allergies  Allergen Reactions  . Latex     Current Outpatient Prescriptions on File Prior to Visit  Medication Sig Dispense Refill  . Cranberry 1000 MG CAPS Take by mouth.    . FeFum-FePoly-FA-B Cmp-C-Biot (INTEGRA PLUS) CAPS Take 1 capsule by mouth daily. 90 capsule 2  . losartan-hydrochlorothiazide (HYZAAR) 100-12.5 MG per tablet Take 1 tablet by mouth  daily. 90 tablet 3  . Multiple Vitamins-Minerals (HAIR/SKIN/NAILS PO) Take 2 each by mouth daily.    . Prenatal Vit-Fe Fumarate-FA (PRENATAL MULTIVITAMIN) TABS Take 1 tablet by mouth daily at 12 noon.    . vitamin B-12 (CYANOCOBALAMIN) 100 MCG tablet Take 50 mcg by mouth daily.    . vitamin C (ASCORBIC ACID) 500 MG tablet Take 500 mg by mouth daily.    . vitamin E 100 UNIT capsule Take 100 Units by mouth daily.    . [DISCONTINUED] lisinopril-hydrochlorothiazide (ZESTORETIC) 20-12.5 MG per tablet Take 1 tablet by mouth daily. 90 tablet 3   No current facility-administered medications on file prior to visit.    BP 110/80 mmHg  Pulse 81  Temp(Src) 98.5 F (36.9 C) (Oral)  Resp 20  Ht 5\' 2"  (1.575 m)  Wt 205 lb (92.987 kg)  BMI 37.49 kg/m2  SpO2 98%  LMP 07/04/2014       Review of Systems  Constitutional: Positive for fatigue and unexpected weight change.  HENT: Negative for congestion, dental problem, hearing loss, rhinorrhea, sinus pressure, sore throat and tinnitus.   Eyes: Negative for pain, discharge and visual disturbance.  Respiratory: Negative for cough and shortness of breath.   Cardiovascular: Negative for chest pain, palpitations and leg swelling.  Gastrointestinal: Negative  for nausea, vomiting, abdominal pain, diarrhea, constipation, blood in stool and abdominal distention.  Genitourinary: Negative for dysuria, urgency, frequency, hematuria, flank pain, vaginal bleeding, vaginal discharge, difficulty urinating, vaginal pain and pelvic pain.  Musculoskeletal: Negative for joint swelling, arthralgias and gait problem.  Skin: Negative for rash.  Neurological: Negative for dizziness, syncope, speech difficulty, weakness, numbness and headaches.  Hematological: Negative for adenopathy.  Psychiatric/Behavioral: Negative for behavioral problems, dysphoric mood and agitation. The patient is not nervous/anxious.        Objective:   Physical Exam  Constitutional:    Overweight Blood pressure 120/80          Assessment & Plan:   Hypertension.  Much improved.  Will add potassium supplementation.  Check screening lab History of impaired glucose tolerance.  We'll check a hemoglobin A1c Obesity.  Weight loss encouraged

## 2015-07-16 LAB — HM MAMMOGRAPHY

## 2015-07-18 ENCOUNTER — Encounter: Payer: Self-pay | Admitting: Internal Medicine

## 2015-09-16 ENCOUNTER — Other Ambulatory Visit: Payer: Self-pay | Admitting: Internal Medicine

## 2015-10-30 ENCOUNTER — Telehealth: Payer: Self-pay | Admitting: Internal Medicine

## 2015-10-30 NOTE — Telephone Encounter (Signed)
[facility] - Client [place] [facility]  Patient Name: Katie Morse  DOB: [birth year]    Initial Comment Caller states that has a dry cough, shortness of breath, bronchitis.    Nurse Assessment  Nurse: Genoveva Ill, RN, Lattie Haw Date/Time (Eastern Time): 10/30/2015 4:36:35 PM  Confirm and document reason for call. If symptomatic, describe symptoms. ---Caller states that has a productive cough with shortness of breath  Has the patient traveled out of the country within the last 30 days? ---No  Does the patient have any new or worsening symptoms? ---Yes  Will a triage be completed? ---Yes  Related visit to physician within the last 2 weeks? ---No  Does the PT have any chronic conditions? (i.e. diabetes, asthma, etc.) ---Yes  List chronic conditions. ---HTN, pre-diabetes  Did the patient indicate they were pregnant? ---No     Guidelines    Guideline Title Affirmed Question Affirmed Notes  Cough - Acute Productive Difficulty breathing    Final Disposition User   Go to ED Now [place], RN, Lattie Haw    Referrals  [facility] - ED   Disagree/Comply: Comply

## 2015-10-31 NOTE — Telephone Encounter (Signed)
Noted  

## 2016-01-21 ENCOUNTER — Other Ambulatory Visit (INDEPENDENT_AMBULATORY_CARE_PROVIDER_SITE_OTHER): Payer: 59

## 2016-01-21 DIAGNOSIS — Z0000 Encounter for general adult medical examination without abnormal findings: Secondary | ICD-10-CM

## 2016-01-21 LAB — CBC WITH DIFFERENTIAL/PLATELET
Basophils Absolute: 0 10*3/uL (ref 0.0–0.1)
Basophils Relative: 0.5 % (ref 0.0–3.0)
EOS PCT: 2.4 % (ref 0.0–5.0)
Eosinophils Absolute: 0.1 10*3/uL (ref 0.0–0.7)
HCT: 37 % (ref 36.0–46.0)
HEMOGLOBIN: 12.3 g/dL (ref 12.0–15.0)
Lymphocytes Relative: 32.4 % (ref 12.0–46.0)
Lymphs Abs: 1.2 10*3/uL (ref 0.7–4.0)
MCHC: 33.3 g/dL (ref 30.0–36.0)
MCV: 79.9 fl (ref 78.0–100.0)
Monocytes Absolute: 0.3 10*3/uL (ref 0.1–1.0)
Monocytes Relative: 9.3 % (ref 3.0–12.0)
Neutro Abs: 2 10*3/uL (ref 1.4–7.7)
Neutrophils Relative %: 55.4 % (ref 43.0–77.0)
Platelets: 262 10*3/uL (ref 150.0–400.0)
RBC: 4.64 Mil/uL (ref 3.87–5.11)
RDW: 15.5 % (ref 11.5–15.5)
WBC: 3.6 10*3/uL — AB (ref 4.0–10.5)

## 2016-01-21 LAB — POC URINALSYSI DIPSTICK (AUTOMATED)
BILIRUBIN UA: NEGATIVE
Blood, UA: NEGATIVE
Glucose, UA: NEGATIVE
Ketones, UA: NEGATIVE
LEUKOCYTES UA: NEGATIVE
NITRITE UA: NEGATIVE
PH UA: 6
PROTEIN UA: NEGATIVE
Spec Grav, UA: 1.02
Urobilinogen, UA: 0.2

## 2016-01-21 LAB — HEPATIC FUNCTION PANEL
ALK PHOS: 62 U/L (ref 39–117)
ALT: 17 U/L (ref 0–35)
AST: 17 U/L (ref 0–37)
Albumin: 4.1 g/dL (ref 3.5–5.2)
BILIRUBIN DIRECT: 0.1 mg/dL (ref 0.0–0.3)
Total Bilirubin: 0.4 mg/dL (ref 0.2–1.2)
Total Protein: 7.9 g/dL (ref 6.0–8.3)

## 2016-01-21 LAB — BASIC METABOLIC PANEL
BUN: 9 mg/dL (ref 6–23)
CO2: 28 mEq/L (ref 19–32)
Calcium: 9.7 mg/dL (ref 8.4–10.5)
Chloride: 103 mEq/L (ref 96–112)
Creatinine, Ser: 0.65 mg/dL (ref 0.40–1.20)
GFR: 123.72 mL/min (ref >60.00)
Glucose, Bld: 136 mg/dL — ABNORMAL HIGH (ref 70–99)
POTASSIUM: 3.4 meq/L — AB (ref 3.5–5.1)
SODIUM: 140 meq/L (ref 135–145)

## 2016-01-21 LAB — LIPID PANEL
CHOLESTEROL: 171 mg/dL (ref 0–200)
HDL: 52.8 mg/dL (ref >39.00)
LDL CALC: 105 mg/dL — AB (ref 0–99)
NonHDL: 117.77
TRIGLYCERIDES: 66 mg/dL (ref 0.0–149.0)
Total CHOL/HDL Ratio: 3
VLDL: 13.2 mg/dL (ref 0.0–40.0)

## 2016-01-21 LAB — MICROALBUMIN / CREATININE URINE RATIO
CREATININE, U: 102.7 mg/dL
MICROALB/CREAT RATIO: 0.5 mg/g (ref 0.0–30.0)
Microalb, Ur: 0.5 mg/dL (ref 0.0–1.9)

## 2016-01-21 LAB — HEMOGLOBIN A1C: HEMOGLOBIN A1C: 6.8 % — AB (ref 4.6–6.5)

## 2016-01-21 LAB — TSH: TSH: 1.34 u[IU]/mL (ref 0.35–4.50)

## 2016-01-27 ENCOUNTER — Encounter: Payer: Self-pay | Admitting: Internal Medicine

## 2016-01-27 ENCOUNTER — Ambulatory Visit (INDEPENDENT_AMBULATORY_CARE_PROVIDER_SITE_OTHER): Payer: 59 | Admitting: Internal Medicine

## 2016-01-27 VITALS — BP 130/80 | HR 82 | Temp 98.2°F | Resp 20 | Ht 62.0 in | Wt 194.0 lb

## 2016-01-27 DIAGNOSIS — Z0000 Encounter for general adult medical examination without abnormal findings: Secondary | ICD-10-CM | POA: Diagnosis not present

## 2016-01-27 DIAGNOSIS — I10 Essential (primary) hypertension: Secondary | ICD-10-CM

## 2016-01-27 DIAGNOSIS — R7302 Impaired glucose tolerance (oral): Secondary | ICD-10-CM

## 2016-01-27 MED ORDER — METFORMIN HCL ER (MOD) 500 MG PO TB24: 1000.0000 mg | ORAL_TABLET | Freq: Every day | ORAL | Status: DC

## 2016-01-27 NOTE — Progress Notes (Signed)
Subjective:    Patient ID: Katie Morse, female    DOB: Mar 21, 1966, 50 y.o.   MRN: [id number]  HPI 47 -year-old patient who is seen today for a preventive health examination.  She is followed by gynecology.  She is doing quite well without concerns or complaints.  She has type 2 diabetes, controlled on metformin therapy as well as treated hypertension.   She has been followed by Dr. 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 ALLERGIC RHINITIS 08/20/2008  . DYSPNEA 11/12/2008  . Headache(784.0) 08/20/2008  . HYPERTENSION 08/20/2008  . LEIOMYOMA, UTERUS 08/20/2008  . PALPITATIONS, RECURRENT 11/12/2008  . Sickle-cell trait ([place]) 08/20/2008  . SYSTOLIC MURMUR [id number]  . Diabetes mellitus without complication [facility])     Social History   Social History  . Marital Status: Married    Spouse Name: N/A  . Number of Children: N/A  . Years of Education: N/A   Occupational History  . Not on file.   Social History Main Topics  . Smoking status: Never Smoker   . Smokeless tobacco: Never Used  . Alcohol Use: Yes     Comment: rarely  . Drug Use: No  . Sexual Activity: Not on file   Other Topics Concern  . Not on file   Social History Narrative    Past Surgical History  Procedure Laterality Date  . Myomectomy      Family History  Problem Relation Age of Onset  . Hypertension Mother   . Cancer Father     lung and prostate ca  .  Hypertension Sister   . Diabetes Other   . Breast cancer Cousin     2-twins    Allergies  Allergen Reactions  . Latex     Current Outpatient Prescriptions on File Prior to Visit  Medication Sig Dispense Refill  . Cranberry 1000 MG CAPS Take by mouth.    . FeFum-FePoly-FA-B Cmp-C-Biot (INTEGRA PLUS) CAPS Take 1 capsule by mouth daily. 90 capsule 2  . losartan-hydrochlorothiazide (HYZAAR) 100-12.5 MG per tablet TAKE 1 TABLET BY MOUTH EVERY DAY 90 tablet 1  . Multiple Vitamins-Minerals (HAIR/SKIN/NAILS PO) Take 2 each by mouth daily.    . potassium chloride SA (K-DUR,KLOR-CON) 20 MEQ tablet Take 1 tablet (20 mEq total) by mouth daily. 90 tablet 3  . Prenatal Vit-Fe Fumarate-FA (PRENATAL MULTIVITAMIN) TABS Take 1 tablet by mouth daily at 12 noon.    . vitamin B-12 (CYANOCOBALAMIN) 100 MCG tablet Take 50 mcg by mouth daily.    . vitamin C (ASCORBIC ACID) 500 MG tablet Take 500 mg by mouth daily.    . vitamin E 100 UNIT capsule Take 100 Units by mouth daily.    . [DISCONTINUED] lisinopril-hydrochlorothiazide (ZESTORETIC) 20-12.5 MG per tablet Take 1 tablet by mouth daily. 90 tablet 3   No current facility-administered medications on file prior to visit.    BP 130/80 mmHg  Pulse 82  Temp(Src) 98.2  F (36.8 C) (Oral)  Resp 20  Ht 5\' 2"  (1.575 m)  Wt 194 lb (87.998 kg)  BMI 35.47 kg/m2  SpO2 98%  LMP 07/04/2014     Review of Systems  Constitutional: Negative for fever, appetite change, fatigue and unexpected weight change.  HENT: Negative for congestion, dental problem, ear pain, hearing loss, mouth sores, nosebleeds, sinus pressure, sore throat, tinnitus, trouble swallowing and voice change.   Eyes: Negative for photophobia, pain, redness and visual disturbance.  Respiratory: Negative for cough, chest tightness and shortness of breath.   Cardiovascular: Negative for chest pain, palpitations and leg swelling.  Gastrointestinal: Negative for nausea, vomiting, abdominal pain,  diarrhea, constipation, blood in stool, abdominal distention and rectal pain.  Genitourinary: Negative for dysuria, urgency, frequency, hematuria, flank pain, vaginal bleeding, vaginal discharge, difficulty urinating, genital sores, vaginal pain, menstrual problem and pelvic pain.  Musculoskeletal: Negative for back pain, arthralgias and neck stiffness.  Skin: Negative for rash.  Neurological: Negative for dizziness, syncope, speech difficulty, weakness, light-headedness, numbness and headaches.  Hematological: Negative for adenopathy. Does not bruise/bleed easily.  Psychiatric/Behavioral: Negative for suicidal ideas, behavioral problems, self-injury, dysphoric mood and agitation. The patient is not nervous/anxious.        Objective:   Physical Exam  Constitutional: She is oriented to person, place, and time. She appears well-developed and well-nourished.  HENT:  Head: Normocephalic and atraumatic.  Right Ear: External ear normal.  Left Ear: External ear normal.  Mouth/Throat: Oropharynx is clear and moist.  Eyes: Conjunctivae and EOM are normal.  Neck: Normal range of motion. Neck supple. No JVD present. No thyromegaly present.  Cardiovascular: Normal rate, regular rhythm, normal heart sounds and intact distal pulses.   No murmur heard. Pulmonary/Chest: Effort normal and breath sounds normal. She has no wheezes. She has no rales.  Abdominal: Soft. Bowel sounds are normal. She exhibits no distension and no mass. There is no tenderness. There is no rebound and no guarding.  Musculoskeletal: Normal range of motion. She exhibits no edema or tenderness.  Neurological: She is alert and oriented to person, place, and time. She has normal reflexes. No cranial nerve deficit. She exhibits normal muscle tone. Coordination normal.  Skin: Skin is warm and dry. No rash noted.  3 cm soft freely movable noninflamed nodule involving the left posterior thigh area  Psychiatric: She has a normal mood and  affect. Her behavior is normal.          Assessment & Plan:   Preventive health examination Hypertension stable Diabetes mellitus.  Hemoglobin A1c at goal.  Diet weight loss exercise all encouraged.  Daily.  Metformin therapy.  Also encouraged   Continue efforts at weight loss Recheck 6 months No change therapy

## 2016-01-27 NOTE — Progress Notes (Signed)
Pre visit review using our clinic review tool, if applicable. No additional management support is needed unless otherwise documented below in the visit note. 

## 2016-01-27 NOTE — Patient Instructions (Addendum)
Limit your sodium (Salt) intake    It is important that you exercise regularly, at least 20 minutes 3 to 4 times per week.  If you develop chest pain or shortness of breath seek  medical attention.  You need to lose weight.  Consider a lower calorie diet and regular exercise.  Return in 6 months for follow-up  Schedule your colonoscopy to help detect colon cancer. 

## 2016-01-28 ENCOUNTER — Encounter: Payer: Self-pay | Admitting: Internal Medicine

## 2016-02-27 ENCOUNTER — Ambulatory Visit (AMBULATORY_SURGERY_CENTER): Payer: Self-pay

## 2016-02-27 VITALS — Ht 62.5 in | Wt 191.0 lb

## 2016-02-27 DIAGNOSIS — Z1211 Encounter for screening for malignant neoplasm of colon: Secondary | ICD-10-CM

## 2016-02-27 MED ORDER — NA SULFATE-K SULFATE-MG SULF 17.5-3.13-1.6 GM/177ML PO SOLN: 1.0000 | Freq: Once | ORAL | Status: DC

## 2016-02-27 NOTE — Progress Notes (Signed)
No egg or soy allergies Not on home 02 No previous anesthesia complications Emmi video emailed to [email or web address].  No diet or weight loss meds

## 2016-03-12 ENCOUNTER — Ambulatory Visit (AMBULATORY_SURGERY_CENTER): Payer: 59 | Admitting: Internal Medicine

## 2016-03-12 ENCOUNTER — Encounter: Payer: Self-pay | Admitting: Internal Medicine

## 2016-03-12 VITALS — BP 129/74 | HR 63 | Temp 97.7°F | Resp 14 | Ht 62.0 in | Wt 191.0 lb

## 2016-03-12 DIAGNOSIS — Z1211 Encounter for screening for malignant neoplasm of colon: Secondary | ICD-10-CM | POA: Diagnosis not present

## 2016-03-12 MED ORDER — SODIUM CHLORIDE 0.9 % IV SOLN: 500.0000 mL | INTRAVENOUS | Status: DC

## 2016-03-12 NOTE — Patient Instructions (Signed)
Discharge instructions given. Handouts on diverticulosis and hemorrhoids. Resume previous medications. YOU HAD AN ENDOSCOPIC PROCEDURE TODAY AT THE [facility]:   Refer to the procedure report that was given to you for any specific questions about what was found during the examination.  If the procedure report does not answer your questions, please call your gastroenterologist to clarify.  If you requested that your care partner not be given the details of your procedure findings, then the procedure report has been included in a sealed envelope for you to review at your convenience later.  YOU SHOULD EXPECT: Some feelings of bloating in the abdomen. Passage of more gas than usual.  Walking can help get rid of the air that was put into your GI tract during the procedure and reduce the bloating. If you had a lower endoscopy (such as a colonoscopy or flexible sigmoidoscopy) you may notice spotting of blood in your stool or on the toilet paper. If you underwent a bowel prep for your procedure, you may not have a normal bowel movement for a few days.  Please Note:  You might notice some irritation and congestion in your nose or some drainage.  This is from the oxygen used during your procedure.  There is no need for concern and it should clear up in a day or so.  SYMPTOMS TO REPORT IMMEDIATELY:   Following lower endoscopy (colonoscopy or flexible sigmoidoscopy):  Excessive amounts of blood in the stool  Significant tenderness or worsening of abdominal pains  Swelling of the abdomen that is new, acute  Fever of 100F or higher   For urgent or emergent issues, a gastroenterologist can be reached at any hour by calling [phone].   DIET: Your first meal following the procedure should be a small meal and then it is ok to progress to your normal diet. Heavy or fried foods are harder to digest and may make you feel nauseous or bloated.  Likewise, meals heavy in dairy and vegetables can  increase bloating.  Drink plenty of fluids but you should avoid alcoholic beverages for 24 hours.  ACTIVITY:  You should plan to take it easy for the rest of today and you should NOT DRIVE or use heavy machinery until tomorrow (because of the sedation medicines used during the test).    FOLLOW UP: Our staff will call the number listed on your records the next business day following your procedure to check on you and address any questions or concerns that you may have regarding the information given to you following your procedure. If we do not reach you, we will leave a message.  However, if you are feeling well and you are not experiencing any problems, there is no need to return our call.  We will assume that you have returned to your regular daily activities without incident.  If any biopsies were taken you will be contacted by phone or by letter within the next 1-3 weeks.  Please call us at [phone] if you have not heard about the biopsies in 3 weeks.    SIGNATURES/CONFIDENTIALITY: You and/or your care partner have signed paperwork which will be entered into your electronic medical record.  These signatures attest to the fact that that the information above on your After Visit Summary has been reviewed and is understood.  Full responsibility of the confidentiality of this discharge information lies with you and/or your care-partner. 

## 2016-03-12 NOTE — Op Note (Signed)
Katie Morse Patient Name: Katie Morse Procedure Date: 03/12/2016 8:48 AM MRN: [id number] Endoscopist: Jerene Bears , MD Age: 50 Referring MD:  Date of Birth: Dec 19, 1966 Gender: Female Procedure:                Colonoscopy Indications:              Screening for colorectal malignant neoplasm, This                            is the patient's first colonoscopy Medicines:                Monitored Anesthesia Care Procedure:                Pre-Anesthesia Assessment:                           - Prior to the procedure, a History and Physical                            was performed, and patient medications and                            allergies were reviewed. The patient's tolerance of                            previous anesthesia was also reviewed. The risks                            and benefits of the procedure and the sedation                            options and risks were discussed with the patient.                            All questions were answered, and informed consent                            was obtained. Prior Anticoagulants: The patient has                            taken no previous anticoagulant or antiplatelet                            agents. ASA Grade Assessment: II - A patient with                            mild systemic disease. After reviewing the risks                            and benefits, the patient was deemed in                            satisfactory condition to undergo the procedure.  After obtaining informed consent, the colonoscope                            was passed under direct vision. Throughout the                            procedure, the patient's blood pressure, pulse, and                            oxygen saturations were monitored continuously. The                            Model PCF-H190L [id number]) scope was introduced                            through the anus and advanced to the  the cecum,                            identified by appendiceal orifice and ileocecal                            valve. The colonoscopy was performed without                            difficulty. The patient tolerated the procedure                            well. The quality of the bowel preparation was                            good. The ileocecal valve, appendiceal orifice, and                            rectum were photographed. Scope In: 9:02:32 AM Scope Out: 9:17:13 AM Scope Withdrawal Time: 0 hours 11 minutes 26 seconds  Total Procedure Duration: 0 hours 14 minutes 41 seconds  Findings:      The digital rectal exam was normal.      A few small-mouthed diverticula were found in the sigmoid colon and       ascending colon.      Internal hemorrhoids were found during retroflexion. The hemorrhoids       were small.      The exam was otherwise without abnormality. Complications:            No immediate complications. Estimated Blood Loss:     Estimated blood loss: none. Impression:               - Diverticulosis in the sigmoid colon and in the                            ascending colon.                           - Small internal hemorrhoids.                           -  The examination was otherwise normal.                           - No specimens collected. Recommendation:           - Patient has a contact number available for                            emergencies. The signs and symptoms of potential                            delayed complications were discussed with the                            patient. Return to normal activities tomorrow.                            Written discharge instructions were provided to the                            patient.                           - Resume previous diet.                           - Continue present medications.                           - Repeat colonoscopy in 10 years for screening                             purposes. Procedure Code(s):        --- Professional ---                           [id number], Colorectal cancer screening; colonoscopy on                            individual not meeting criteria for high risk CPT copyright 2016 American Medical Association. All rights reserved. Lajuan Lines. Hilarie Fredrickson, MD Jerene Bears, MD 03/12/2016 9:20:33 AM This report has been signed electronically. Number of Addenda: 0 Referring MD:      Marletta Lor

## 2016-03-12 NOTE — Progress Notes (Signed)
Report to PACU, RN, vss, BBS= Clear.  

## 2016-03-13 ENCOUNTER — Telehealth: Payer: Self-pay | Admitting: *Deleted

## 2016-03-13 NOTE — Telephone Encounter (Signed)
  Follow up Call-  Call back number 03/12/2016  Post procedure Call Back phone  # [phone]  Permission to leave phone message Yes     Patient questions:  Do you have a fever, pain , or abdominal swelling? No. Pain Score  0 *  Have you tolerated food without any problems? Yes.    Have you been able to return to your normal activities? No.  Do you have any questions about your discharge instructions: Diet   No. Medications  No. Follow up visit  No.  Do you have questions or concerns about your Care? No.  Actions: * If pain score is 4 or above: No action needed, pain <4.

## 2016-03-25 ENCOUNTER — Other Ambulatory Visit: Payer: Self-pay | Admitting: Internal Medicine

## 2016-05-07 ENCOUNTER — Other Ambulatory Visit: Payer: Self-pay | Admitting: Internal Medicine

## 2016-07-28 ENCOUNTER — Ambulatory Visit: Payer: 59 | Admitting: Internal Medicine

## 2016-08-04 ENCOUNTER — Encounter: Payer: Self-pay | Admitting: Internal Medicine

## 2016-08-04 ENCOUNTER — Ambulatory Visit (INDEPENDENT_AMBULATORY_CARE_PROVIDER_SITE_OTHER): Payer: 59 | Admitting: Internal Medicine

## 2016-08-04 VITALS — BP 110/70 | HR 80 | Temp 98.1°F | Ht 62.0 in | Wt 184.0 lb

## 2016-08-04 DIAGNOSIS — E119 Type 2 diabetes mellitus without complications: Secondary | ICD-10-CM | POA: Insufficient documentation

## 2016-08-04 DIAGNOSIS — I10 Essential (primary) hypertension: Secondary | ICD-10-CM | POA: Diagnosis not present

## 2016-08-04 LAB — HEMOGLOBIN A1C: Hgb A1c MFr Bld: 6.6 % — ABNORMAL HIGH (ref 4.6–6.5)

## 2016-08-04 NOTE — Progress Notes (Signed)
Subjective:    Patient ID: Katie Morse, female    DOB: 01-Feb-1966, 50 y.o.   MRN: [id number]  HPI  50 year old patient who is seen today for follow-up of hypertension and type 2 diabetes.  Her diabetes has been well-controlled on metformin therapy only.  No concerns or complaints today.  She is on statin therapy, which she continues to tolerate.  No cardiopulmonary complaints  Past Medical History:  Diagnosis Date  . ALLERGIC RHINITIS 08/20/2008  . Allergy   . Anemia   . Blood transfusion without reported diagnosis 2011   post surgery  . Diabetes mellitus without complication ([place])   . DYSPNEA 11/12/2008  . Headache(784.0) 08/20/2008  . HYPERTENSION 08/20/2008  . LEIOMYOMA, UTERUS 08/20/2008  . PALPITATIONS, RECURRENT 11/12/2008  . Sickle-cell trait ([place]) 08/20/2008  . SYSTOLIC MURMUR [id number]     Social History   Social History  . Marital status: Married    Spouse name: N/A  . Number of children: N/A  . Years of education: N/A   Occupational History  . Not on file.   Social History Main Topics  . Smoking status: Never Smoker  . Smokeless tobacco: Never Used  . Alcohol use 0.0 oz/week     Comment: rarely  . Drug use: No  . Sexual activity: Not on file   Other Topics Concern  . Not on file   Social History Narrative  . No narrative on file    Past Surgical History:  Procedure Laterality Date  . MYOMECTOMY     fibroids    Family History  Problem Relation Age of Onset  . Hypertension Mother   . Cancer Father     lung and prostate ca  . Hypertension Sister   . Diabetes Other   . Breast cancer Cousin     2-twins  . Colon cancer Neg Hx     Allergies  Allergen Reactions  . Latex     Current Outpatient Prescriptions on File Prior to Visit  Medication Sig Dispense Refill  . Cranberry 1000 MG CAPS Take by mouth.    . FeFum-FePoly-FA-B Cmp-C-Biot (INTEGRA PLUS) CAPS Take 1 capsule by mouth daily. 90 capsule 2  . KLOR-CON M20 20 MEQ  tablet TAKE 1 TABLET (20 MEQ TOTAL) BY MOUTH DAILY. 90 tablet 2  . losartan-hydrochlorothiazide (HYZAAR) 100-12.5 MG tablet TAKE 1 TABLET BY MOUTH EVERY DAY 90 tablet 1  . metFORMIN (GLUMETZA) 500 MG (MOD) 24 hr tablet Take 2 tablets (1,000 mg total) by mouth daily with breakfast. 180 tablet 6  . Multiple Vitamins-Minerals (HAIR/SKIN/NAILS PO) Take 2 each by mouth daily.    . Prenatal Vit-Fe Fumarate-FA (PRENATAL MULTIVITAMIN) TABS Take 1 tablet by mouth daily at 12 noon.    . vitamin B-12 (CYANOCOBALAMIN) 100 MCG tablet Take 50 mcg by mouth daily.    . vitamin C (ASCORBIC ACID) 500 MG tablet Take 500 mg by mouth daily.    . vitamin E 100 UNIT capsule Take 100 Units by mouth daily.    . [DISCONTINUED] lisinopril-hydrochlorothiazide (ZESTORETIC) 20-12.5 MG per tablet Take 1 tablet by mouth daily. 90 tablet 3   No current facility-administered medications on file prior to visit.     BP 110/70 (BP Location: Left Arm, Patient Position: Sitting, Cuff Size: Large)   Pulse 80   Temp 98.1 F (36.7 C) (Oral)   Ht 5\' 2"  (1.575 m)   Wt 184 lb (83.5 kg)   LMP 07/04/2014 Comment: Perimenopausal  SpO2 99%   BMI  33.65 kg/m     Review of Systems  Constitutional: Negative.   HENT: Negative for congestion, dental problem, hearing loss, rhinorrhea, sinus pressure, sore throat and tinnitus.   Eyes: Negative for pain, discharge and visual disturbance.  Respiratory: Negative for cough and shortness of breath.   Cardiovascular: Negative for chest pain, palpitations and leg swelling.  Gastrointestinal: Negative for abdominal distention, abdominal pain, blood in stool, constipation, diarrhea, nausea and vomiting.  Genitourinary: Negative for difficulty urinating, dysuria, flank pain, frequency, hematuria, pelvic pain, urgency, vaginal bleeding, vaginal discharge and vaginal pain.  Musculoskeletal: Negative for arthralgias, gait problem and joint swelling.  Skin: Negative for rash.  Neurological: Negative  for dizziness, syncope, speech difficulty, weakness, numbness and headaches.  Hematological: Negative for adenopathy.  Psychiatric/Behavioral: Negative for agitation, behavioral problems and dysphoric mood. The patient is not nervous/anxious.        Objective:   Physical Exam  Constitutional: She is oriented to person, place, and time. She appears well-developed and well-nourished.  Weight 184 Blood pressure 110/70  Wt Readings from Last 3 Encounters: 08/04/16 : 184 lb (83.5 kg) 03/12/16 : 191 lb (86.6 kg) 02/27/16 : 191 lb (86.6 kg)  HENT:  Head: Normocephalic.  Right Ear: External ear normal.  Left Ear: External ear normal.  Mouth/Throat: Oropharynx is clear and moist.  Eyes: Conjunctivae and EOM are normal. Pupils are equal, round, and reactive to light.  Neck: Normal range of motion. Neck supple. No thyromegaly present.  Cardiovascular: Normal rate, regular rhythm, normal heart sounds and intact distal pulses.   Pulmonary/Chest: Effort normal and breath sounds normal.  Abdominal: Soft. Bowel sounds are normal. She exhibits no mass. There is no tenderness.  Musculoskeletal: Normal range of motion.  Lymphadenopathy:    She has no cervical adenopathy.  Neurological: She is alert and oriented to person, place, and time.  Skin: Skin is warm and dry. No rash noted.  Psychiatric: She has a normal mood and affect. Her behavior is normal.          Assessment & Plan:   Diabetes mellitus.  Will check a hemoglobin A1c Essential hypertension, stable Obesity.  Continue efforts at weight loss  CPX 6 months

## 2016-08-04 NOTE — Progress Notes (Signed)
Pre visit review using our clinic review tool, if applicable. No additional management support is needed unless otherwise documented below in the visit note. 

## 2016-08-04 NOTE — Patient Instructions (Signed)
Limit your sodium (Salt) intake   Please check your hemoglobin A1c every 3-6  Months  Please check your blood pressure on a regular basis.  If it is consistently greater than 150/90, please make an office appointment.    It is important that you exercise regularly, at least 20 minutes 3 to 4 times per week.  If you develop chest pain or shortness of breath seek  medical attention.  You need to lose weight.  Consider a lower calorie diet and regular exercise.  

## 2016-08-21 ENCOUNTER — Telehealth: Payer: Self-pay | Admitting: Internal Medicine

## 2016-08-21 ENCOUNTER — Ambulatory Visit (INDEPENDENT_AMBULATORY_CARE_PROVIDER_SITE_OTHER): Payer: 59 | Admitting: Internal Medicine

## 2016-08-21 ENCOUNTER — Encounter: Payer: Self-pay | Admitting: Internal Medicine

## 2016-08-21 VITALS — BP 114/80 | HR 92 | Temp 98.4°F | Resp 20 | Ht 62.0 in | Wt 184.2 lb

## 2016-08-21 DIAGNOSIS — R252 Cramp and spasm: Secondary | ICD-10-CM | POA: Diagnosis not present

## 2016-08-21 DIAGNOSIS — I10 Essential (primary) hypertension: Secondary | ICD-10-CM

## 2016-08-21 DIAGNOSIS — E119 Type 2 diabetes mellitus without complications: Secondary | ICD-10-CM

## 2016-08-21 MED ORDER — LOSARTAN POTASSIUM 100 MG PO TABS: 100.0000 mg | ORAL_TABLET | Freq: Every day | ORAL | 3 refills | Status: DC

## 2016-08-21 NOTE — Progress Notes (Signed)
Subjective:    Patient ID: Katie Morse, female    DOB: 12/20/1966, 50 y.o.   MRN: [id number]  HPI  50 year old patient who has essential hypertension.  She is controlled on combination therapy which includes hydrochlorothiazide.  She is on potassium supplementation due to periodic hypokalemia.  Over the past 24 hours she has had 2 episodes of significant cramping involving her lower extremities. Both episode occurred during sleep  Past Medical History:  Diagnosis Date  . ALLERGIC RHINITIS 08/20/2008  . Allergy   . Anemia   . Blood transfusion without reported diagnosis 2011   post surgery  . Diabetes mellitus without complication ([place])   . DYSPNEA 11/12/2008  . Headache(784.0) 08/20/2008  . HYPERTENSION 08/20/2008  . LEIOMYOMA, UTERUS 08/20/2008  . PALPITATIONS, RECURRENT 11/12/2008  . Sickle-cell trait ([place]) 08/20/2008  . SYSTOLIC MURMUR [id number]     Social History   Social History  . Marital status: Married    Spouse name: N/A  . Number of children: N/A  . Years of education: N/A   Occupational History  . Not on file.   Social History Main Topics  . Smoking status: Never Smoker  . Smokeless tobacco: Never Used  . Alcohol use 0.0 oz/week     Comment: rarely  . Drug use: No  . Sexual activity: Not on file   Other Topics Concern  . Not on file   Social History Narrative  . No narrative on file    Past Surgical History:  Procedure Laterality Date  . MYOMECTOMY     fibroids    Family History  Problem Relation Age of Onset  . Hypertension Mother   . Cancer Father     lung and prostate ca  . Hypertension Sister   . Diabetes Other   . Breast cancer Cousin     2-twins  . Colon cancer Neg Hx     Allergies  Allergen Reactions  . Latex     Current Outpatient Prescriptions on File Prior to Visit  Medication Sig Dispense Refill  . Cranberry 1000 MG CAPS Take by mouth.    . FeFum-FePoly-FA-B Cmp-C-Biot (INTEGRA PLUS) CAPS Take 1 capsule by  mouth daily. 90 capsule 2  . KLOR-CON M20 20 MEQ tablet TAKE 1 TABLET (20 MEQ TOTAL) BY MOUTH DAILY. 90 tablet 2  . losartan-hydrochlorothiazide (HYZAAR) 100-12.5 MG tablet TAKE 1 TABLET BY MOUTH EVERY DAY 90 tablet 1  . metFORMIN (GLUMETZA) 500 MG (MOD) 24 hr tablet Take 2 tablets (1,000 mg total) by mouth daily with breakfast. 180 tablet 6  . Multiple Vitamins-Minerals (HAIR/SKIN/NAILS PO) Take 2 each by mouth daily.    . Prenatal Vit-Fe Fumarate-FA (PRENATAL MULTIVITAMIN) TABS Take 1 tablet by mouth daily at 12 noon.    . vitamin B-12 (CYANOCOBALAMIN) 100 MCG tablet Take 50 mcg by mouth daily.    . vitamin C (ASCORBIC ACID) 500 MG tablet Take 500 mg by mouth daily.    . vitamin E 100 UNIT capsule Take 100 Units by mouth daily.    . [DISCONTINUED] lisinopril-hydrochlorothiazide (ZESTORETIC) 20-12.5 MG per tablet Take 1 tablet by mouth daily. 90 tablet 3   No current facility-administered medications on file prior to visit.     BP 114/80 (BP Location: Left Arm, Patient Position: Sitting, Cuff Size: Normal)   Pulse 92   Temp 98.4 F (36.9 C) (Oral)   Resp 20   Ht 5\' 2"  (1.575 m)   Wt 184 lb 4 oz (83.6 kg)  LMP 07/04/2014 Comment: Perimenopausal  SpO2 99%   BMI 33.70 kg/m     Review of Systems  Constitutional: Negative.   HENT: Negative for congestion, dental problem, hearing loss, rhinorrhea, sinus pressure, sore throat and tinnitus.   Eyes: Negative for pain, discharge and visual disturbance.  Respiratory: Negative for cough and shortness of breath.   Cardiovascular: Negative for chest pain, palpitations and leg swelling.  Gastrointestinal: Negative for abdominal distention, abdominal pain, blood in stool, constipation, diarrhea, nausea and vomiting.  Genitourinary: Negative for difficulty urinating, dysuria, flank pain, frequency, hematuria, pelvic pain, urgency, vaginal bleeding, vaginal discharge and vaginal pain.  Musculoskeletal: Positive for myalgias. Negative for  arthralgias, gait problem and joint swelling.  Skin: Negative for rash.  Neurological: Negative for dizziness, syncope, speech difficulty, weakness, numbness and headaches.  Hematological: Negative for adenopathy.  Psychiatric/Behavioral: Negative for agitation, behavioral problems and dysphoric mood. The patient is not nervous/anxious.        Objective:   Physical Exam  Constitutional: She appears well-developed and well-nourished. No distress.  Blood pressure as low as 104 over 74  Musculoskeletal: Normal range of motion. She exhibits no edema or tenderness.          Assessment & Plan:   Episodic cramps of the legs Hypertension History of hypokalemia  Blood pressure is a low-normal range.  We'll discontinue hydrochlorothiazide and continue losartan alone.  Continue home blood pressure monitoring   Katie Morse

## 2016-08-21 NOTE — Telephone Encounter (Signed)
Pt has appt scheduled with Dr. Raliegh Ip

## 2016-08-21 NOTE — Telephone Encounter (Signed)
Patient Name: Katie Morse  DOB: September 15, 1966    Initial Comment legs locked up from the knee down to the foot turning feet a bit towards the outside, extreme pain, could not move, when she tried they would get worse. all happened when she just woke up from nap   Nurse Assessment  Nurse: Mallie Mussel, RN, Alveta Heimlich Date/Time Eilene Ghazi Time): 08/21/2016 12:23:23 PM  Confirm and document reason for call. If symptomatic, describe symptoms. You must click the next button to save text entered. ---Caller states that she woke this morning and she had a cramp in the left leg. She was able to get the cramp out of her leg this morning. She woke up from a nap and she is having cramps in both legs at this time. It makes the calf muscles were locked up and she couldn't move her feet. She is able to stand and walk now but with pain. She rates her pain as 1-2 on 0-10 scale. at present, but it was 10 earlier. She states that the episode lasted about 7-8 minutes. She states that her muscles feel strange to her. She has never had anything like this before. She denies chest pain and difficulty breathing.  Has the patient traveled out of the country within the last 30 days? ---No  Does the patient have any new or worsening symptoms? ---Yes  Will a triage be completed? ---Yes  Related visit to physician within the last 2 weeks? ---No  Does the PT have any chronic conditions? (i.e. diabetes, asthma, etc.) ---Yes  List chronic conditions. ---HTN, Diabetes,  Is the patient pregnant or possibly pregnant? (Ask all females between the ages of 95-55) ---No  Is this a behavioral health or substance abuse call? ---No     Guidelines    Guideline Title Affirmed Question Affirmed Notes  Leg Pain Numbness in a leg or foot (i.e., loss of sensation)    Final Disposition User   See Physician within 24 Hours Mallie Mussel, RN, Alveta Heimlich    Comments  She states that she has numbness in her feet that is different from what she had with her diabetes.   Appointment scheduled for today at 2:30pm with Dr. Burnice Logan.   Referrals  REFERRED TO PCP OFFICE   Disagree/Comply: Comply

## 2016-08-21 NOTE — Progress Notes (Signed)
Pre visit review using our clinic review tool, if applicable. No additional management support is needed unless otherwise documented below in the visit note. 

## 2016-08-21 NOTE — Patient Instructions (Addendum)
Limit your sodium (Salt) intake  Please check your blood pressure on a regular basis.  If it is consistently greater than 150/90, please make an office appointment.  Leg Cramps Leg cramps occur when a muscle or muscles tighten and you have no control over this tightening (involuntary muscle contraction). Muscle cramps can develop in any muscle, but the most common place is in the calf muscles of the leg. Those cramps can occur during exercise or when you are at rest. Leg cramps are painful, and they may last for a few seconds to a few minutes. Cramps may return several times before they finally stop. Usually, leg cramps are not caused by a serious medical problem. In many cases, the cause is not known. Some common causes include:  Overexertion.  Overuse from repetitive motions, or doing the same thing over and over.  Remaining in a certain position for a long period of time.  Improper preparation, form, or technique while performing a sport or an activity.  Dehydration.  Injury.  Side effects of some medicines.  Abnormally low levels of the salts and ions in your blood (electrolytes), especially potassium and calcium. These levels could be low if you are taking water pills (diuretics) or if you are pregnant. HOME CARE INSTRUCTIONS Watch your condition for any changes. Taking the following actions may help to lessen any discomfort that you are feeling:  Stay well-hydrated. Drink enough fluid to keep your urine clear or pale yellow.  Try massaging, stretching, and relaxing the affected muscle. Do this for several minutes at a time.  For tight or tense muscles, use a warm towel, heating pad, or hot shower water directed to the affected area.  If you are sore or have pain after a cramp, applying ice to the affected area may relieve discomfort.  Put ice in a plastic bag.  Place a towel between your skin and the bag.  Leave the ice on for 20 minutes, 2-3 times per day.  Avoid  strenuous exercise for several days if you have been having frequent leg cramps.  Make sure that your diet includes the essential minerals for your muscles to work normally.  Take medicines only as directed by your health care provider. SEEK MEDICAL CARE IF:  Your leg cramps get more severe or more frequent, or they do not improve over time.  Your foot becomes cold, numb, or blue.   This information is not intended to replace advice given to you by your health care provider. Make sure you discuss any questions you have with your health care provider.   Document Released: 01/14/2005 Document Revised: 04/23/2015 Document Reviewed: 11/14/2014 Elsevier Interactive Patient Education [employer].

## 2017-01-29 ENCOUNTER — Other Ambulatory Visit: Payer: 59

## 2017-02-05 ENCOUNTER — Encounter: Payer: Self-pay | Admitting: Internal Medicine

## 2017-02-05 ENCOUNTER — Ambulatory Visit (INDEPENDENT_AMBULATORY_CARE_PROVIDER_SITE_OTHER): Payer: 59 | Admitting: Internal Medicine

## 2017-02-05 VITALS — BP 146/82 | HR 72 | Temp 98.1°F | Ht 62.0 in | Wt 194.6 lb

## 2017-02-05 DIAGNOSIS — Z0000 Encounter for general adult medical examination without abnormal findings: Secondary | ICD-10-CM | POA: Diagnosis not present

## 2017-02-05 LAB — LIPID PANEL
CHOL/HDL RATIO: 3
Cholesterol: 203 mg/dL — ABNORMAL HIGH (ref 0–200)
HDL: 67.6 mg/dL (ref >39.00)
LDL Cholesterol: 123 mg/dL — ABNORMAL HIGH (ref 0–99)
NonHDL: 134.99
Triglycerides: 62 mg/dL (ref 0.0–149.0)
VLDL: 12.4 mg/dL (ref 0.0–40.0)

## 2017-02-05 LAB — MICROALBUMIN / CREATININE URINE RATIO
Creatinine,U: 62 mg/dL
Microalb Creat Ratio: 1.1 mg/g (ref 0.0–30.0)
Microalb, Ur: <0.7 mg/dL (ref 0.0–1.9)

## 2017-02-05 LAB — POC URINALSYSI DIPSTICK (AUTOMATED)
BILIRUBIN UA: NEGATIVE
Glucose, UA: NEGATIVE
KETONES UA: NEGATIVE
LEUKOCYTES UA: NEGATIVE
NITRITE UA: NEGATIVE
PROTEIN UA: NEGATIVE
RBC UA: NEGATIVE
Spec Grav, UA: 1.02
Urobilinogen, UA: 0.2
pH, UA: 7

## 2017-02-05 LAB — CBC WITH DIFFERENTIAL/PLATELET
BASOS PCT: 0.8 % (ref 0.0–3.0)
Basophils Absolute: 0 10*3/uL (ref 0.0–0.1)
EOS PCT: 2.9 % (ref 0.0–5.0)
Eosinophils Absolute: 0.1 10*3/uL (ref 0.0–0.7)
HEMATOCRIT: 36 % (ref 36.0–46.0)
HEMOGLOBIN: 11.8 g/dL — AB (ref 12.0–15.0)
LYMPHS PCT: 32.1 % (ref 12.0–46.0)
Lymphs Abs: 1.4 10*3/uL (ref 0.7–4.0)
MCHC: 32.8 g/dL (ref 30.0–36.0)
MCV: 80.8 fl (ref 78.0–100.0)
MONOS PCT: 7.8 % (ref 3.0–12.0)
Monocytes Absolute: 0.4 10*3/uL (ref 0.1–1.0)
Neutro Abs: 2.5 10*3/uL (ref 1.4–7.7)
Neutrophils Relative %: 56.4 % (ref 43.0–77.0)
Platelets: 285 10*3/uL (ref 150.0–400.0)
RBC: 4.46 Mil/uL (ref 3.87–5.11)
RDW: 14.9 % (ref 11.5–15.5)
WBC: 4.5 10*3/uL (ref 4.0–10.5)

## 2017-02-05 LAB — BASIC METABOLIC PANEL
BUN: 9 mg/dL (ref 6–23)
CHLORIDE: 104 meq/L (ref 96–112)
CO2: 26 mEq/L (ref 19–32)
Calcium: 9.4 mg/dL (ref 8.4–10.5)
Creatinine, Ser: 0.58 mg/dL (ref 0.40–1.20)
GFR: 140.53 mL/min (ref >60.00)
Glucose, Bld: 115 mg/dL — ABNORMAL HIGH (ref 70–99)
POTASSIUM: 3.7 meq/L (ref 3.5–5.1)
SODIUM: 139 meq/L (ref 135–145)

## 2017-02-05 LAB — HEPATIC FUNCTION PANEL
ALBUMIN: 4 g/dL (ref 3.5–5.2)
ALT: 18 U/L (ref 0–35)
AST: 16 U/L (ref 0–37)
Alkaline Phosphatase: 68 U/L (ref 39–117)
BILIRUBIN TOTAL: 0.5 mg/dL (ref 0.2–1.2)
Bilirubin, Direct: 0.1 mg/dL (ref 0.0–0.3)
Total Protein: 7.4 g/dL (ref 6.0–8.3)

## 2017-02-05 LAB — TSH: TSH: 1.02 u[IU]/mL (ref 0.35–4.50)

## 2017-02-05 LAB — HEMOGLOBIN A1C: HEMOGLOBIN A1C: 6.6 % — AB (ref 4.6–6.5)

## 2017-02-05 NOTE — Progress Notes (Signed)
Subjective:    Patient ID: Katie Morse, female    DOB: 06-02-1966, 51 y.o.   MRN: [id number]  HPI  51 -year-old patient who is seen today for a preventive health examination.  She is followed by gynecology.  She is doing quite well without concerns or complaints.  She has type 2 diabetes, controlled on metformin therapy as well as treated hypertension.   She has been followed by Dr. Chalmers Morse at  [facility] and apparently takes metformin only sporadically.  Her mother underwent knee surgery in December 2017 and the patient has been under considerable stress.  There is been a 10 pound weight gain.  Colonoscopy 2017  Lab Results  Component Value Date   HGBA1C 6.6 (H) 08/04/2016    Current Allergies:  No known allergies   Past Medical History:   sickle cell trait  Allergic rhinitis  Headache  Hypertension  DM2  Past Surgical History:  unremarkable   Family History:  father died age 46, for lung cancer, history of prostate cancer  mother h/o  hypertension  One sister with treated hypertension  5 brothers are well  Social History:  Married Never Smoked  Games developer and sales  Risk Factors:  Tobacco use: never   Review of Systems  Constitutional: Positive for unexpected weight change. Negative for appetite change, fatigue and fever.  HENT: Negative for congestion, dental problem, ear pain, hearing loss, mouth sores, nosebleeds, sinus pressure, sore throat, tinnitus, trouble swallowing and voice change.   Eyes: Negative for photophobia, pain, redness and visual disturbance.  Respiratory: Negative for cough, chest tightness and shortness of breath.   Cardiovascular: Negative for chest pain, palpitations and leg swelling.  Gastrointestinal: Negative for abdominal distention, abdominal pain, blood in stool, constipation, diarrhea, nausea, rectal pain and vomiting.  Genitourinary: Negative for difficulty urinating, dysuria, flank pain,  frequency, genital sores, hematuria, menstrual problem, pelvic pain, urgency, vaginal bleeding, vaginal discharge and vaginal pain.  Musculoskeletal: Negative for arthralgias, back pain and neck stiffness.  Skin: Negative for rash.  Neurological: Negative for dizziness, syncope, speech difficulty, weakness, light-headedness, numbness and headaches.  Hematological: Negative for adenopathy. Does not bruise/bleed easily.  Psychiatric/Behavioral: Negative for agitation, behavioral problems, dysphoric mood, self-injury and suicidal ideas. The patient is not nervous/anxious.        Objective:   Physical Exam  Constitutional: She is oriented to person, place, and time. She appears well-developed and well-nourished.  HENT:  Head: Normocephalic and atraumatic.  Right Ear: External ear normal.  Left Ear: External ear normal.  Mouth/Throat: Oropharynx is clear and moist.  Eyes: Conjunctivae and EOM are normal.  Neck: Normal range of motion. Neck supple. No JVD present. No thyromegaly present.  Cardiovascular: Normal rate, regular rhythm, normal heart sounds and intact distal pulses.   No murmur heard. Pulmonary/Chest: Effort normal and breath sounds normal. She has no wheezes. She has no rales.  Abdominal: Soft. Bowel sounds are normal. She exhibits no distension and no mass. There is no tenderness. There is no rebound and no guarding.  Musculoskeletal: Normal range of motion. She exhibits no edema or tenderness.  Neurological: She is alert and oriented to person, place, and time. She has normal reflexes. No cranial nerve deficit. She exhibits normal muscle tone. Coordination normal.  Skin: Skin is warm and dry. No rash noted.  Psychiatric: She has a normal mood and affect. Her behavior is normal.          Assessment & Plan:    Preventive health  examination Type 2 diabetes.  Will check hemoglobin A1c, urine for microalbumin and lipid profile.  She received a recall letter for a follow-up  eye examination in May Obesity.  Modest weight gain Essential hypertension, stable.  Continue losartan Preventive health.  Follow-up OB/GYN  Six-month follow-up Weight loss encouraged  Katie Morse

## 2017-02-05 NOTE — Patient Instructions (Signed)
Limit your sodium (Salt) intake    It is important that you exercise regularly, at least 20 minutes 3 to 4 times per week.  If you develop chest pain or shortness of breath seek  medical attention.   Please check your hemoglobin A1c every 6  Months  You need to lose weight.  Consider a lower calorie diet and regular exercise.

## 2017-02-05 NOTE — Addendum Note (Signed)
Addended by: Tomi Likens on: 02/05/2017 10:08 AM   Modules accepted: Orders

## 2017-02-05 NOTE — Progress Notes (Signed)
Pre visit review using our clinic review tool, if applicable. No additional management support is needed unless otherwise documented below in the visit note. 

## 2017-08-06 ENCOUNTER — Ambulatory Visit: Payer: Self-pay | Admitting: Internal Medicine

## 2017-08-09 ENCOUNTER — Encounter: Payer: Self-pay | Admitting: Internal Medicine

## 2017-08-09 ENCOUNTER — Ambulatory Visit (INDEPENDENT_AMBULATORY_CARE_PROVIDER_SITE_OTHER): Payer: Self-pay | Admitting: Internal Medicine

## 2017-08-09 VITALS — BP 122/80 | HR 77 | Temp 98.1°F | Ht 62.0 in | Wt 193.8 lb

## 2017-08-09 DIAGNOSIS — E119 Type 2 diabetes mellitus without complications: Secondary | ICD-10-CM

## 2017-08-09 DIAGNOSIS — I10 Essential (primary) hypertension: Secondary | ICD-10-CM

## 2017-08-09 NOTE — Patient Instructions (Signed)
Limit your sodium (Salt) intake  Please check your blood pressure on a regular basis.  If it is consistently greater than 150/90, please make an office appointment.   Please check your hemoglobin A1c every 6 months    It is important that you exercise regularly, at least 20 minutes 3 to 4 times per week.  If you develop chest pain or shortness of breath seek  medical attention.  You need to lose weight.  Consider a lower calorie diet and regular exercise.

## 2017-08-09 NOTE — Progress Notes (Signed)
Subjective:    Patient ID: Katie Morse, female    DOB: Jun 25, 1966, 51 y.o.   MRN: [id number]  HPI  51 year old patient who has type 2 diabetes.  She has been followed by endocrinology and is scheduled for follow-up soon. She has a history of essential hypertension which has been managed on losartan. No concerns or complaints  No health insurance   Lab Results  Component Value Date   HGBA1C 6.6 (H) 02/05/2017    Past Medical History:  Diagnosis Date  . ALLERGIC RHINITIS 08/20/2008  . Allergy   . Anemia   . Blood transfusion without reported diagnosis 2011   post surgery  . Diabetes mellitus without complication ([place])   . DYSPNEA 11/12/2008  . Headache(784.0) 08/20/2008  . HYPERTENSION 08/20/2008  . LEIOMYOMA, UTERUS 08/20/2008  . PALPITATIONS, RECURRENT 11/12/2008  . Sickle-cell trait ([place]) 08/20/2008  . SYSTOLIC MURMUR 78/29/5621     Social History   Social History  . Marital status: Married    Spouse name: N/A  . Number of children: N/A  . Years of education: N/A   Occupational History  . Not on file.   Social History Main Topics  . Smoking status: Never Smoker  . Smokeless tobacco: Never Used  . Alcohol use 0.0 oz/week     Comment: rarely  . Drug use: No  . Sexual activity: Not on file   Other Topics Concern  . Not on file   Social History Narrative  . No narrative on file    Past Surgical History:  Procedure Laterality Date  . MYOMECTOMY     fibroids    Family History  Problem Relation Age of Onset  . Hypertension Mother   . Cancer Father        lung and prostate ca  . Hypertension Sister   . Diabetes Other   . Breast cancer Cousin        2-twins  . Colon cancer Neg Hx     Allergies  Allergen Reactions  . Latex     Current Outpatient Prescriptions on File Prior to Visit  Medication Sig Dispense Refill  . Cranberry 1000 MG CAPS Take by mouth.    . FeFum-FePoly-FA-B Cmp-C-Biot (INTEGRA PLUS) CAPS Take 1 capsule by  mouth daily. 90 capsule 2  . losartan (COZAAR) 100 MG tablet Take 1 tablet (100 mg total) by mouth daily. 90 tablet 3  . Multiple Vitamins-Minerals (HAIR/SKIN/NAILS PO) Take 2 each by mouth daily.    . Prenatal Vit-Fe Fumarate-FA (PRENATAL MULTIVITAMIN) TABS Take 1 tablet by mouth daily at 12 noon.    . vitamin B-12 (CYANOCOBALAMIN) 100 MCG tablet Take 50 mcg by mouth daily.    . vitamin C (ASCORBIC ACID) 500 MG tablet Take 500 mg by mouth daily.    . vitamin E 100 UNIT capsule Take 100 Units by mouth daily.    . [DISCONTINUED] lisinopril-hydrochlorothiazide (ZESTORETIC) 20-12.5 MG per tablet Take 1 tablet by mouth daily. 90 tablet 3   No current facility-administered medications on file prior to visit.     BP 122/80 (BP Location: Left Arm, Patient Position: Sitting, Cuff Size: Normal)   Pulse 77   Temp 98.1 F (36.7 C) (Oral)   Ht 5\' 2"  (1.575 m)   Wt 193 lb 12.8 oz (87.9 kg)   LMP 07/04/2014 Comment: Perimenopausal  SpO2 98%   BMI 35.45 kg/m     Review of Systems  Constitutional: Negative.   HENT: Negative for congestion, dental problem, hearing  loss, rhinorrhea, sinus pressure, sore throat and tinnitus.   Eyes: Negative for pain, discharge and visual disturbance.  Respiratory: Negative for cough and shortness of breath.   Cardiovascular: Negative for chest pain, palpitations and leg swelling.  Gastrointestinal: Negative for abdominal distention, abdominal pain, blood in stool, constipation, diarrhea, nausea and vomiting.  Genitourinary: Negative for difficulty urinating, dysuria, flank pain, frequency, hematuria, pelvic pain, urgency, vaginal bleeding, vaginal discharge and vaginal pain.  Musculoskeletal: Negative for arthralgias, gait problem and joint swelling.  Skin: Negative for rash.  Neurological: Negative for dizziness, syncope, speech difficulty, weakness, numbness and headaches.  Hematological: Negative for adenopathy.  Psychiatric/Behavioral: Negative for agitation,  behavioral problems and dysphoric mood. The patient is not nervous/anxious.        Objective:   Physical Exam  Constitutional: She is oriented to person, place, and time. She appears well-developed and well-nourished.  Weight 193  HENT:  Head: Normocephalic.  Right Ear: External ear normal.  Left Ear: External ear normal.  Mouth/Throat: Oropharynx is clear and moist.  Eyes: Pupils are equal, round, and reactive to light. Conjunctivae and EOM are normal.  Neck: Normal range of motion. Neck supple. No thyromegaly present.  Cardiovascular: Normal rate, regular rhythm, normal heart sounds and intact distal pulses.   Pulmonary/Chest: Effort normal and breath sounds normal.  Abdominal: Soft. Bowel sounds are normal. She exhibits no mass. There is no tenderness.  Musculoskeletal: Normal range of motion.  Lymphadenopathy:    She has no cervical adenopathy.  Neurological: She is alert and oriented to person, place, and time.  Skin: Skin is warm and dry. No rash noted.  Psychiatric: She has a normal mood and affect. Her behavior is normal.          Assessment & Plan:   Diabetes mellitus.  Follow-up endocrinology.  Diabetes has been well-controlled off therapy Essential hypertension, well-controlled No health insurance coverage.  Patient has been asked return in 6-12 months for an annual exam  Katie Morse   No change in medical regimen

## 2017-10-25 ENCOUNTER — Other Ambulatory Visit: Payer: Self-pay | Admitting: Internal Medicine

## 2018-03-24 ENCOUNTER — Telehealth: Payer: Self-pay | Admitting: Internal Medicine

## 2018-03-24 NOTE — Telephone Encounter (Signed)
Left message to call back about her rash on her neck - informed she needs an office visit to have rash assessed.

## 2018-03-24 NOTE — Telephone Encounter (Signed)
Patient was told that Dr.Kwiatkowski is out of the office until Monday. I asked if she would like to see one of the other  providers tomorrow and she said no she had to work. Patient was told by the pharmacy that it looked like she just needed Cortizone sent in. Patient stated that it looks worse then her normal neck rash she gets. Patient also stated that its been keeping her up at night. I informed patient that if she didn't feel any better to try to see a doctor tomorrow. Patient stated that due to her work schedule she cannot take off and would rather the cream just to be sent to the pharmacy.

## 2018-03-24 NOTE — Telephone Encounter (Signed)
Copied from [place] [id number]. Topic: Quick Communication - See Telephone Encounter >> Mar 24, 2018 11:04 AM Ivar Drape wrote: CRM for notification. See Telephone encounter for: 03/24/18. Patient has a bad rash on her neck.  She used a Cortizone cream the pharmacy suggested but it's not working.  So the pharmacist suggested she call her provider to write a prescription for Cortizone at 2.76ml.

## 2018-03-24 NOTE — Telephone Encounter (Signed)
Attempted to contact pt; left message on voice mail [phone].

## 2018-03-28 MED ORDER — HYDROCORTISONE 2.5 % EX CREA: TOPICAL_CREAM | Freq: Two times a day (BID) | CUTANEOUS | 0 refills | Status: DC

## 2018-03-28 NOTE — Addendum Note (Signed)
Addended by: Gwenyth Ober R on: 03/28/2018 12:39 PM   Modules accepted: Orders

## 2018-03-28 NOTE — Telephone Encounter (Signed)
Hydrocortisone 2.5% number 30 g apply twice daily to rash.  Return office visit if unimproved

## 2018-05-13 NOTE — Telephone Encounter (Signed)
This encounter was created in error - please disregard.

## 2018-08-08 ENCOUNTER — Encounter: Payer: Self-pay | Admitting: Internal Medicine

## 2018-08-09 ENCOUNTER — Telehealth: Payer: Self-pay | Admitting: Internal Medicine

## 2018-08-09 NOTE — Telephone Encounter (Signed)
Copied from [place] [id number]. Topic: Quick Communication - Rx Refill/Question >> Aug 09, 2018  3:57 PM Sheppard Coil, [employer] L wrote: Medication: losartan (COZAAR) 100 MG tablet  Pt states her pharmacy refilled this off of an old RX and it doesn't have the "water pill" with it.  Pt was told by pharmacy to call doctor and see if he would write script for the "water pill" portion only while she finishes out this 90 day supply.  Preferred Pharmacy (with phone number or street name): [id number] - [place], [place] [phone] (Phone) [phone] (Fax)  Agent: Please be advised that RX refills may take up to 3 business days. We ask that you follow-up with your pharmacy.

## 2018-08-10 ENCOUNTER — Other Ambulatory Visit: Payer: Self-pay | Admitting: Internal Medicine

## 2018-08-10 MED ORDER — FUROSEMIDE 20 MG PO TABS: 20.0000 mg | ORAL_TABLET | Freq: Every day | ORAL | 3 refills | Status: DC

## 2018-08-10 NOTE — Telephone Encounter (Signed)
Pt stated that her endocrinologist stated she need to get back on the lasix due to edema these pat 2 weeks. Pt is wanting to get back on the Lasix. She hasnt checked her Bp in a while due to losing the cuff.  Please advise

## 2018-08-10 NOTE — Telephone Encounter (Signed)
Prescription electronically refilled

## 2018-08-10 NOTE — Telephone Encounter (Signed)
Noted  

## 2018-08-10 NOTE — Telephone Encounter (Signed)
Patient notified of results and verbalized understanding.  

## 2018-08-10 NOTE — Telephone Encounter (Signed)
Left detailed message advising pt to call back with home BP readings.

## 2018-08-10 NOTE — Telephone Encounter (Signed)
Is pt suppose to be on fluid pill? Please advise

## 2018-08-10 NOTE — Telephone Encounter (Signed)
At the time of her last visit 1 year ago, the patient was on losartan 100 mg only without a diuretic.  If blood pressure is well controlled, no additional diuretic needed

## 2018-08-17 ENCOUNTER — Ambulatory Visit (INDEPENDENT_AMBULATORY_CARE_PROVIDER_SITE_OTHER): Payer: Self-pay | Admitting: Internal Medicine

## 2018-08-17 ENCOUNTER — Encounter: Payer: Self-pay | Admitting: Internal Medicine

## 2018-08-17 VITALS — BP 100/80 | HR 86 | Temp 98.2°F | Ht 62.0 in | Wt 186.4 lb

## 2018-08-17 DIAGNOSIS — E119 Type 2 diabetes mellitus without complications: Secondary | ICD-10-CM

## 2018-08-17 LAB — POCT GLYCOSYLATED HEMOGLOBIN (HGB A1C): Hemoglobin A1C: 6.4 % — AB (ref 4.0–5.6)

## 2018-08-17 MED ORDER — METFORMIN HCL ER 500 MG PO TB24: 1000.0000 mg | ORAL_TABLET | Freq: Every day | ORAL | 4 refills | Status: DC

## 2018-08-17 MED ORDER — FUROSEMIDE 20 MG PO TABS: 20.0000 mg | ORAL_TABLET | Freq: Every day | ORAL | 3 refills | Status: DC

## 2018-08-17 MED ORDER — LOSARTAN POTASSIUM 100 MG PO TABS: 100.0000 mg | ORAL_TABLET | Freq: Every day | ORAL | 4 refills | Status: DC

## 2018-08-17 NOTE — Progress Notes (Signed)
Subjective:    Patient ID: Katie Morse, female    DOB: 06-20-1966, 52 y.o.   MRN: [id number]  HPI  Lab Results  Component Value Date   HGBA1C 6.6 (H) 02/05/2017   52 year old patient who is seen today for a preventive health examination. Still no health insurance.  She has a history of prior type 2 diabetes which has been controlled on metformin therapy.  Patient has been off metformin She has a history of essential hypertension which has been controlled with losartan  She has been seen by gynecology over the years.  She has also been followed by Dr. Chalmers Cater at [place] from Last 3 Encounters:  08/17/18 186 lb 6.4 oz (84.6 kg)  08/09/17 193 lb 12.8 oz (87.9 kg)  02/05/17 194 lb 9.6 oz (88.3 kg)   Colonoscopy 2017  Social history.  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   HENT: Negative for congestion, dental problem, hearing loss, rhinorrhea, sinus pressure, sore throat and tinnitus.   Eyes: Negative for pain, discharge and visual disturbance.  Respiratory: Negative for cough and shortness of breath.   Cardiovascular: Negative for chest pain, palpitations and leg swelling.  Gastrointestinal: Negative for abdominal distention, abdominal pain, blood in stool, constipation, diarrhea, nausea and vomiting.  Genitourinary: Negative for difficulty urinating, dysuria, flank pain, frequency, hematuria, pelvic pain,  urgency, vaginal bleeding, vaginal discharge and vaginal pain.  Musculoskeletal: Negative for arthralgias, gait problem and joint swelling.  Skin: Negative for rash.  Neurological: Negative for dizziness, syncope, speech difficulty, weakness, numbness and headaches.  Hematological: Negative for adenopathy.  Psychiatric/Behavioral: Negative for agitation, behavioral problems and dysphoric mood. The patient is not nervous/anxious.        Objective:   Physical Exam  Constitutional: She is oriented to person, place, and time. She appears well-developed and well-nourished.  Blood pressure 110/76 Weight 186  HENT:  Head: Normocephalic and atraumatic.  Right Ear: External ear normal.  Left Ear: External ear normal.  Mouth/Throat: Oropharynx is clear and moist.  Eyes: Conjunctivae and EOM are normal.  Neck: Normal range of motion. Neck supple. No JVD present. No thyromegaly present.  Cardiovascular: Normal rate, regular rhythm, normal heart sounds and intact distal pulses.  No murmur heard. Pulmonary/Chest: Effort normal and breath sounds normal. She has no wheezes. She has no rales.  Abdominal: Soft. Bowel sounds are normal. She exhibits no distension and no mass. There is no tenderness. There is no rebound and no guarding.  Musculoskeletal: Normal range of motion. She exhibits no edema or tenderness.  Neurological: She is alert and oriented to person, place, and time. She has normal reflexes. She displays normal reflexes. No cranial nerve deficit. She exhibits normal muscle tone. Coordination normal.  Skin: Skin is warm and dry. No rash noted.  Psychiatric: She has a normal mood and affect. Her behavior is normal.          Assessment & Plan:  Preventive health examination Diabetes mellitus.   Check hemoglobin A1c.  Due to cost considerations will defer additional laboratory testing at this time Essential hypertension well-controlled.  Will continue losartan  Follow-up 6  months  Marletta [place]

## 2018-08-17 NOTE — Patient Instructions (Addendum)
Limit your sodium (Salt) intake   Please check your hemoglobin A1c every 6 months    It is important that you exercise regularly, at least 20 minutes 3 to 4 times per week.  If you develop chest pain or shortness of breath seek  medical attention.  You need to lose weight.  Consider a lower calorie diet and regular exercise.  Please see your eye doctor yearly to check for diabetic eye damage  Return in 6 months for follow-up    Health Maintenance for Postmenopausal Women Menopause is a normal process in which your reproductive ability comes to an end. This process happens gradually over a span of months to years, usually between the ages of 32 and 77. Menopause is complete when you have missed 12 consecutive menstrual periods. It is important to talk with your health care provider about some of the most common conditions that affect postmenopausal women, such as heart disease, cancer, and bone loss (osteoporosis). Adopting a healthy lifestyle and getting preventive care can help to promote your health and wellness. Those actions can also lower your chances of developing some of these common conditions. What should I know about menopause? During menopause, you may experience a number of symptoms, such as:  Moderate-to-severe hot flashes.  Night sweats.  Decrease in sex drive.  Mood swings.  Headaches.  Tiredness.  Irritability.  Memory problems.  Insomnia.  Choosing to treat or not to treat menopausal changes is an individual decision that you make with your health care provider. What should I know about hormone replacement therapy and supplements? Hormone therapy products are effective for treating symptoms that are associated with menopause, such as hot flashes and night sweats. Hormone replacement carries certain risks, especially as you become older. If you are thinking about using estrogen or estrogen with progestin treatments, discuss the benefits and risks with your  health care provider. What should I know about heart disease and stroke? Heart disease, heart attack, and stroke become more likely as you age. This may be due, in part, to the hormonal changes that your body experiences during menopause. These can affect how your body processes dietary fats, triglycerides, and cholesterol. Heart attack and stroke are both medical emergencies. There are many things that you can do to help prevent heart disease and stroke:  Have your blood pressure checked at least every 1-2 years. High blood pressure causes heart disease and increases the risk of stroke.  If you are 42-72 years old, ask your health care provider if you should take aspirin to prevent a heart attack or a stroke.  Do not use any tobacco products, including cigarettes, chewing tobacco, or electronic cigarettes. If you need help quitting, ask your health care provider.  It is important to eat a healthy diet and maintain a healthy weight. ? Be sure to include plenty of vegetables, fruits, low-fat dairy products, and lean protein. ? Avoid eating foods that are high in solid fats, added sugars, or salt (sodium).  Get regular exercise. This is one of the most important things that you can do for your health. ? Try to exercise for at least 150 minutes each week. The type of exercise that you do should increase your heart rate and make you sweat. This is known as moderate-intensity exercise. ? Try to do strengthening exercises at least twice each week. Do these in addition to the moderate-intensity exercise.  Know your numbers.Ask your health care provider to check your cholesterol and your blood glucose. Continue  to have your blood tested as directed by your health care provider.  What should I know about cancer screening? There are several types of cancer. Take the following steps to reduce your risk and to catch any cancer development as early as possible. Breast Cancer  Practice breast  self-awareness. ? This means understanding how your breasts normally appear and feel. ? It also means doing regular breast self-exams. Let your health care provider know about any changes, no matter how small.  If you are 97 or older, have a clinician do a breast exam (clinical breast exam or CBE) every year. Depending on your age, family history, and medical history, it may be recommended that you also have a yearly breast X-ray (mammogram).  If you have a family history of breast cancer, talk with your health care provider about genetic screening.  If you are at high risk for breast cancer, talk with your health care provider about having an MRI and a mammogram every year.  Breast cancer (BRCA) gene test is recommended for women who have family members with BRCA-related cancers. Results of the assessment will determine the need for genetic counseling and BRCA1 and for BRCA2 testing. BRCA-related cancers include these types: ? Breast. This occurs in males or females. ? Ovarian. ? Tubal. This may also be called fallopian tube cancer. ? Cancer of the abdominal or pelvic lining (peritoneal cancer). ? Prostate. ? Pancreatic.  Cervical, Uterine, and Ovarian Cancer Your health care provider may recommend that you be screened regularly for cancer of the pelvic organs. These include your ovaries, uterus, and vagina. This screening involves a pelvic exam, which includes checking for microscopic changes to the surface of your cervix (Pap test).  For women ages 21-65, health care providers may recommend a pelvic exam and a Pap test every three years. For women ages 59-65, they may recommend the Pap test and pelvic exam, combined with testing for human papilloma virus (HPV), every five years. Some types of HPV increase your risk of cervical cancer. Testing for HPV may also be done on women of any age who have unclear Pap test results.  Other health care providers may not recommend any screening for  nonpregnant women who are considered low risk for pelvic cancer and have no symptoms. Ask your health care provider if a screening pelvic exam is right for you.  If you have had past treatment for cervical cancer or a condition that could lead to cancer, you need Pap tests and screening for cancer for at least 20 years after your treatment. If Pap tests have been discontinued for you, your risk factors (such as having a new sexual partner) need to be reassessed to determine if you should start having screenings again. Some women have medical problems that increase the chance of getting cervical cancer. In these cases, your health care provider may recommend that you have screening and Pap tests more often.  If you have a family history of uterine cancer or ovarian cancer, talk with your health care provider about genetic screening.  If you have vaginal bleeding after reaching menopause, tell your health care provider.  There are currently no reliable tests available to screen for ovarian cancer.  Lung Cancer Lung cancer screening is recommended for adults 78-74 years old who are at high risk for lung cancer because of a history of smoking. A yearly low-dose CT scan of the lungs is recommended if you:  Currently smoke.  Have a history of at least 30  pack-years of smoking and you currently smoke or have quit within the past 15 years. A pack-year is smoking an average of one pack of cigarettes per day for one year.  Yearly screening should:  Continue until it has been 15 years since you quit.  Stop if you develop a health problem that would prevent you from having lung cancer treatment.  Colorectal Cancer  This type of cancer can be detected and can often be prevented.  Routine colorectal cancer screening usually begins at age 21 and continues through age 106.  If you have risk factors for colon cancer, your health care provider may recommend that you be screened at an earlier age.  If you  have a family history of colorectal cancer, talk with your health care provider about genetic screening.  Your health care provider may also recommend using home test kits to check for hidden blood in your stool.  A small camera at the end of a tube can be used to examine your colon directly (sigmoidoscopy or colonoscopy). This is done to check for the earliest forms of colorectal cancer.  Direct examination of the colon should be repeated every 5-10 years until age 91. However, if early forms of precancerous polyps or small growths are found or if you have a family history or genetic risk for colorectal cancer, you may need to be screened more often.  Skin Cancer  Check your skin from head to toe regularly.  Monitor any moles. Be sure to tell your health care provider: ? About any new moles or changes in moles, especially if there is a change in a mole's shape or color. ? If you have a mole that is larger than the size of a pencil eraser.  If any of your family members has a history of skin cancer, especially at a young age, talk with your health care provider about genetic screening.  Always use sunscreen. Apply sunscreen liberally and repeatedly throughout the day.  Whenever you are outside, protect yourself by wearing long sleeves, pants, a wide-brimmed hat, and sunglasses.  What should I know about osteoporosis? Osteoporosis is a condition in which bone destruction happens more quickly than new bone creation. After menopause, you may be at an increased risk for osteoporosis. To help prevent osteoporosis or the bone fractures that can happen because of osteoporosis, the following is recommended:  If you are 60-69 years old, get at least 1,000 mg of calcium and at least 600 mg of vitamin D per day.  If you are older than age 81 but younger than age 62, get at least 1,200 mg of calcium and at least 600 mg of vitamin D per day.  If you are older than age 69, get at least 1,200 mg of  calcium and at least 800 mg of vitamin D per day.  Smoking and excessive alcohol intake increase the risk of osteoporosis. Eat foods that are rich in calcium and vitamin D, and do weight-bearing exercises several times each week as directed by your health care provider. What should I know about how menopause affects my mental health? Depression may occur at any age, but it is more common as you become older. Common symptoms of depression include:  Low or sad mood.  Changes in sleep patterns.  Changes in appetite or eating patterns.  Feeling an overall lack of motivation or enjoyment of activities that you previously enjoyed.  Frequent crying spells.  Talk with your health care provider if you think that  you are experiencing depression. What should I know about immunizations? It is important that you get and maintain your immunizations. These include:  Tetanus, diphtheria, and pertussis (Tdap) booster vaccine.  Influenza every year before the flu season begins.  Pneumonia vaccine.  Shingles vaccine.  Your health care provider may also recommend other immunizations. This information is not intended to replace advice given to you by your health care provider. Make sure you discuss any questions you have with your health care provider. Document Released: 01/29/2006 Document Revised: 06/26/2016 Document Reviewed: 09/10/2015 Elsevier Interactive Patient Education  2018 [employer].

## 2018-09-30 ENCOUNTER — Telehealth: Payer: Self-pay | Admitting: Internal Medicine

## 2018-09-30 NOTE — Telephone Encounter (Signed)
Is there another alternative? Please advise  Pharmacy does not know how long it will be on back order

## 2018-09-30 NOTE — Telephone Encounter (Signed)
Copied from [place] [id number]. Topic: Quick Communication - See Telephone Encounter >> Sep 30, 2018  9:23 AM Conception Chancy, NT wrote: CRM for notification. See Telephone encounter for: 09/30/18.  Patient is calling and states the pharmacy switched her losartan (COZAAR) 100 MG tablet to Losartan potassium 100mg  since it was recalled. She states this makes her sick and can not take it. She would like to know can something else be called in.  [id number] - [place], [place] [address] Phone: [phone] Fax: [phone]

## 2018-10-03 NOTE — Telephone Encounter (Signed)
Spoke to pt and informed her that this is the only option. Pt was advise to take at lunch time. Pt stated she always take it at lunch. I informed pt that Dr.Todd advised her to come in if nothing helped. Pt verbalized understanding and will return phone call once she figures something out for scheduling. Pt does not have insurance so she has to see if she has enough money to cover visit.

## 2018-10-03 NOTE — Telephone Encounter (Signed)
Tillie Rung please call the patient and have her try to take the medicine at lunchtime with food and see if that helps.  If it does not she will need an office visit to discuss other medications

## 2019-02-17 ENCOUNTER — Ambulatory Visit: Payer: BLUE CROSS/BLUE SHIELD | Admitting: Internal Medicine

## 2019-02-17 ENCOUNTER — Encounter: Payer: Self-pay | Admitting: Internal Medicine

## 2019-02-17 VITALS — BP 130/80 | HR 88 | Temp 98.3°F | Ht 62.0 in | Wt 195.7 lb

## 2019-02-17 DIAGNOSIS — Z803 Family history of malignant neoplasm of breast: Secondary | ICD-10-CM | POA: Diagnosis not present

## 2019-02-17 DIAGNOSIS — I10 Essential (primary) hypertension: Secondary | ICD-10-CM | POA: Diagnosis not present

## 2019-02-17 DIAGNOSIS — E1169 Type 2 diabetes mellitus with other specified complication: Secondary | ICD-10-CM

## 2019-02-17 DIAGNOSIS — M25512 Pain in left shoulder: Secondary | ICD-10-CM

## 2019-02-17 DIAGNOSIS — Z23 Encounter for immunization: Secondary | ICD-10-CM | POA: Diagnosis not present

## 2019-02-17 DIAGNOSIS — Z1239 Encounter for other screening for malignant neoplasm of breast: Secondary | ICD-10-CM

## 2019-02-17 DIAGNOSIS — E119 Type 2 diabetes mellitus without complications: Secondary | ICD-10-CM

## 2019-02-17 DIAGNOSIS — E785 Hyperlipidemia, unspecified: Secondary | ICD-10-CM

## 2019-02-17 DIAGNOSIS — D1724 Benign lipomatous neoplasm of skin and subcutaneous tissue of left leg: Secondary | ICD-10-CM

## 2019-02-17 LAB — POCT GLYCOSYLATED HEMOGLOBIN (HGB A1C): HEMOGLOBIN A1C: 6.8 % — AB (ref 4.0–5.6)

## 2019-02-17 NOTE — Patient Instructions (Signed)
-  Nice meeting you today!  -Pneumonia vaccine today.  -Referrals to ortho and general surgery have been placed.  -Schedule follow up in 6 months (august) for your physical.

## 2019-02-17 NOTE — Progress Notes (Signed)
Established Patient Office Visit     CC/Reason for Visit: Establish care, follow up chronic conditions  HPI: Katie Morse is a 53 y.o. female who is coming in today for the above mentioned reasons. Past Medical History is significant for: diet controlled DM, HTN well controlled, HLD not on statins. She finally was able to get health insurance thru her husband. She has a couple of acute issues today: lipoma of left leg, near buttocks. She had seen Gen Surgery in the past and was going to have it removed, however could not afford it. She also has pain in her left shoulder for about 6-8 weeks that is worse with extreme range of motions. She is a never smoker, no ETOH use. She is currently not working.   Past Medical/Surgical History: Past Medical History:  Diagnosis Date  . ALLERGIC RHINITIS 08/20/2008  . Allergy   . Anemia   . Blood transfusion without reported diagnosis 2011   post surgery  . Diabetes mellitus without complication ([place])   . DYSPNEA 11/12/2008  . Headache(784.0) 08/20/2008  . HYPERTENSION 08/20/2008  . LEIOMYOMA, UTERUS 08/20/2008  . PALPITATIONS, RECURRENT 11/12/2008  . Sickle-cell trait ([place]) 08/20/2008  . SYSTOLIC MURMUR 08/65/7846    Past Surgical History:  Procedure Laterality Date  . MYOMECTOMY     fibroids    Social History:  reports that she has never smoked. She has never used smokeless tobacco. She reports current alcohol use. She reports that she does not use drugs.  Allergies: Allergies  Allergen Reactions  . Latex     Family History:  Family History  Problem Relation Age of Onset  . Hypertension Mother   . Cancer Father        lung and prostate ca  . Hypertension Sister   . Diabetes Other   . Breast cancer Cousin        2-twins  . Colon cancer Neg Hx      Current Outpatient Medications:  .  Cranberry 1000 MG CAPS, Take by mouth., Disp: , Rfl:  .  hydrocortisone 2.5 % cream, Apply topically 2 (two) times daily.,  Disp: 30 g, Rfl: 0 .  losartan (COZAAR) 100 MG tablet, Take 1 tablet (100 mg total) by mouth daily., Disp: 90 tablet, Rfl: 4 .  Multiple Vitamins-Minerals (HAIR/SKIN/NAILS PO), Take 2 each by mouth daily., Disp: , Rfl:  .  Prenatal Vit-Fe Fumarate-FA (PRENATAL MULTIVITAMIN) TABS, Take 1 tablet by mouth daily at 12 noon., Disp: , Rfl:  .  vitamin B-12 (CYANOCOBALAMIN) 100 MCG tablet, Take 50 mcg by mouth daily., Disp: , Rfl:  .  vitamin C (ASCORBIC ACID) 500 MG tablet, Take 500 mg by mouth daily., Disp: , Rfl:  .  vitamin E 100 UNIT capsule, Take 100 Units by mouth daily., Disp: , Rfl:  .  FeFum-FePoly-FA-B Cmp-C-Biot (INTEGRA PLUS) CAPS, Take 1 capsule by mouth daily. (Patient not taking: Reported on 02/17/2019), Disp: 90 capsule, Rfl: 2 .  furosemide (LASIX) 20 MG tablet, Take 1 tablet (20 mg total) by mouth daily. (Patient not taking: Reported on 02/17/2019), Disp: 60 tablet, Rfl: 3 .  potassium chloride SA (K-DUR,KLOR-CON) 20 MEQ tablet, Take by mouth., Disp: , Rfl:   Review of Systems:  Constitutional: Denies fever, chills, diaphoresis, appetite change and fatigue.  HEENT: Denies photophobia, eye pain, redness, hearing loss, ear pain, congestion, sore throat, rhinorrhea, sneezing, mouth sores, trouble swallowing, neck pain, neck stiffness and tinnitus.   Respiratory: Denies SOB, DOE, cough, chest  tightness,  and wheezing.   Cardiovascular: Denies chest pain, palpitations and leg swelling.  Gastrointestinal: Denies nausea, vomiting, abdominal pain, diarrhea, constipation, blood in stool and abdominal distention.  Genitourinary: Denies dysuria, urgency, frequency, hematuria, flank pain and difficulty urinating.  Endocrine: Denies: hot or cold intolerance, sweats, changes in hair or nails, polyuria, polydipsia. Musculoskeletal: Denies myalgias, back pain, joint swelling, arthralgias and gait problem.  Skin: Denies pallor, rash and wound.  Neurological: Denies dizziness, seizures, syncope,  weakness, light-headedness, numbness and headaches.  Hematological: Denies adenopathy. Easy bruising, personal or family bleeding history  Psychiatric/Behavioral: Denies suicidal ideation, mood changes, confusion, nervousness, sleep disturbance and agitation    Physical Exam: Vitals:   02/17/19 1119  BP: 130/80  Pulse: 88  Temp: 98.3 F (36.8 C)  TempSrc: Oral  SpO2: 95%  Weight: 195 lb 11.2 oz (88.8 kg)  Height: 5\' 2"  (1.575 m)    Body mass index is 35.79 kg/m.   Constitutional: NAD, calm, comfortable Eyes: PERRL, lids and conjunctivae normal ENMT: Mucous membranes are moist.  Respiratory: clear to auscultation bilaterally, no wheezing, no crackles. Normal respiratory effort. No accessory muscle use.  Cardiovascular: Regular rate and rhythm, no murmurs / rubs / gallops. No extremity edema. 2+ pedal pulses. No carotid bruits.  Musculoskeletal: no clubbing / cyanosis. No joint deformity upper and lower extremities.  Normal muscle tone.  Skin: no rashes, lesions, ulcers. No induration Neurologic: CN 2-12 grossly intact. Sensation intact, DTR normal. Strength 5/5 in all 4.  Psychiatric: Normal judgment and insight. Alert and oriented x 3. Normal mood.    Impression and Plan:  Screening breast examination - Plan: MM Digital Screening  Controlled type 2 diabetes mellitus without complication, without long-term current use of insulin (HCC)  -A1c of 6.8 off medications. -Follows with endocrine, Dr. Chalmers Cater.  Essential hypertension -Well controlled on losartan 100 mg which she takes every other day.  Lipoma of left lower extremity - Plan: Ambulatory referral to General Surgery -She had seen GEN SURG before but did not have it removed due to cost issues, but she now has health insurance.  Acute pain of left shoulder - Plan: Ambulatory referral to Orthopedic Surgery  Need for pneumococcal vaccination - Plan: Pneumococcal polysaccharide vaccine 23-valent greater than or equal to  2yo subcutaneous/IM  Hyperlipidemia associated with type 2 diabetes mellitus (HCC) -Check lipids at CPE to determine if she needs to start statin Rx.  Morbid obesity ([place]) -BMI >35 with >2 comorbidities. -Discussed lifestyle changes.    Patient Instructions  -Nice meeting you today!  -Pneumonia vaccine today.  -Referrals to ortho and general surgery have been placed.  -Schedule follow up in 6 months (august) for your physical.      Lelon Frohlich, MD [facility]

## 2019-02-21 ENCOUNTER — Other Ambulatory Visit: Payer: Self-pay | Admitting: Internal Medicine

## 2019-02-21 ENCOUNTER — Other Ambulatory Visit: Payer: Self-pay | Admitting: *Deleted

## 2019-02-21 MED ORDER — HYDROCORTISONE 2.5 % EX CREA: TOPICAL_CREAM | Freq: Two times a day (BID) | CUTANEOUS | 0 refills | Status: DC

## 2019-02-21 MED ORDER — LOSARTAN POTASSIUM 100 MG PO TABS: 100.0000 mg | ORAL_TABLET | Freq: Every day | ORAL | 1 refills | Status: DC

## 2019-02-21 NOTE — Telephone Encounter (Signed)
Requested medication (s) are due for refill today: yes  Requested medication (s) are on the active medication list: yes  Last refill:  03/28/18  Future visit scheduled: yes  Notes to clinic:  No protocol    Requested Prescriptions  Pending Prescriptions Disp Refills   hydrocortisone 2.5 % cream 30 g 0    Sig: Apply topically 2 (two) times daily.     Off-Protocol Failed - 02/21/2019 12:11 PM      Failed - Medication not assigned to a protocol, review manually.      Passed - Valid encounter within last 12 months    Recent Outpatient Visits          4 days ago Family history of breast cancer   [facility], Rayford Halsted, MD   6 months ago Controlled type 2 diabetes mellitus without complication, without long-term current use of insulin ([place])   [facility], Doretha Sou, MD   1 year ago Essential hypertension   [facility], Doretha Sou, MD   2 years ago Encounter for preventive health examination   [facility], Doretha Sou, MD   2 years ago Essential hypertension   [facility], Doretha Sou, MD      Future Appointments            In 6 months Katie Morse, Rayford Halsted, MD [place] [facility] [place], [facility]

## 2019-02-21 NOTE — Telephone Encounter (Signed)
Copied from [place] [id number]. Topic: Quick Communication - Rx Refill/Question >> Feb 21, 2019 12:03 PM Reyne Dumas L wrote: Medication:  losartan (COZAAR) 100 MG tablet hydrocortisone 2.5 % cream  Has the patient contacted their pharmacy? Yes - states they haven't received this since she was seen last (Agent: If no, request that the patient contact the pharmacy for the refill.) (Agent: If yes, when and what did the pharmacy advise?)  Preferred Pharmacy (with phone number or street name): [id number] [place], [place] - [address] [place] [phone] (Phone) [phone] (Fax)  Agent: Please be advised that RX refills may take up to 3 business days. We ask that you follow-up with your pharmacy.

## 2019-02-21 NOTE — Progress Notes (Signed)
Requested Prescriptions  Pending Prescriptions Disp Refills  . losartan (COZAAR) 100 MG tablet 90 tablet 1    Sig: Take 1 tablet (100 mg total) by mouth daily.     There is no refill protocol information for this order

## 2019-02-22 ENCOUNTER — Encounter (INDEPENDENT_AMBULATORY_CARE_PROVIDER_SITE_OTHER): Payer: Self-pay | Admitting: Orthopaedic Surgery

## 2019-02-22 ENCOUNTER — Telehealth: Payer: Self-pay | Admitting: Internal Medicine

## 2019-02-22 ENCOUNTER — Ambulatory Visit (INDEPENDENT_AMBULATORY_CARE_PROVIDER_SITE_OTHER): Payer: Self-pay

## 2019-02-22 ENCOUNTER — Ambulatory Visit (INDEPENDENT_AMBULATORY_CARE_PROVIDER_SITE_OTHER): Payer: BLUE CROSS/BLUE SHIELD | Admitting: Orthopaedic Surgery

## 2019-02-22 DIAGNOSIS — G8929 Other chronic pain: Secondary | ICD-10-CM | POA: Insufficient documentation

## 2019-02-22 DIAGNOSIS — M542 Cervicalgia: Secondary | ICD-10-CM

## 2019-02-22 DIAGNOSIS — M25512 Pain in left shoulder: Secondary | ICD-10-CM | POA: Diagnosis not present

## 2019-02-22 MED ORDER — BUPIVACAINE HCL 0.25 % IJ SOLN
2.0000 mL | INTRAMUSCULAR | Status: AC | PRN
  Administered 2019-02-22: 2 mL via INTRA_ARTICULAR

## 2019-02-22 MED ORDER — LIDOCAINE HCL 2 % IJ SOLN
2.0000 mL | INTRAMUSCULAR | Status: AC | PRN
  Administered 2019-02-22: 2 mL

## 2019-02-22 MED ORDER — METHYLPREDNISOLONE ACETATE 40 MG/ML IJ SUSP
40.0000 mg | INTRAMUSCULAR | Status: AC | PRN
  Administered 2019-02-22: 40 mg via INTRA_ARTICULAR

## 2019-02-22 MED ORDER — DICLOFENAC SODIUM 1 % TD CREA: TOPICAL_CREAM | TRANSDERMAL | 1 refills | Status: DC

## 2019-02-22 NOTE — Telephone Encounter (Signed)
Ok to refill 

## 2019-02-22 NOTE — Progress Notes (Signed)
Office Visit Note   Patient: Katie Morse           Date of Birth: 07-02-1966           MRN: [id number] Visit Date: 02/22/2019              Requested by: Isaac Bliss, Rayford Halsted, MD [place], [place] [address] PCP: Isaac Bliss, Rayford Halsted, MD   Assessment & Plan: Visit Diagnoses:  1. Chronic left shoulder pain   2. Neck pain     Plan: Impression is left shoulder rotator cuff tendinopathy and cervical spine radiculopathy.  We will proceed with a left shoulder subacromial cortisone injection.  She will give this at least 2 weeks to see if she notices relief.  She will call us after that point time and if she only has relief during the anesthetic phase or not long relief following, we will get an MRI of her left shoulder to assess her rotator cuff.  Follow-Up Instructions: Return if symptoms worsen or fail to improve.   Orders:  Orders Placed This Encounter  Procedures  . Large Joint Inj: L subacromial bursa  . XR Shoulder Left  . XR Cervical Spine 2 or 3 views   No orders of the defined types were placed in this encounter.     Procedures: Large Joint Inj: L subacromial bursa on 02/22/2019 2:38 PM Indications: pain Details: 22 G needle Medications: 2 mL bupivacaine 0.25 %; 2 mL lidocaine 2 %; 40 mg methylPREDNISolone acetate 40 MG/ML Outcome: tolerated well, no immediate complications Patient was prepped and draped in the usual sterile fashion.       Clinical Data: No additional findings.   Subjective: Chief Complaint  Patient presents with  . Left Shoulder - Pain  . Neck - Pain    HPI patient is a pleasant 53 year old right-hand-dominant female who presents our clinic today with left shoulder pain.  This began a few years back when she picked up a small child and twisted the wrong way.  She has had moderate pain to the deltoid and bicipital groove since.  The pain has recently worsened. She is starting to get weakness in the  sense of pressure to the left upper extremity.  She has increased pain sleeping on the side as well as with forward flexion and internal rotation.  She is used Retail banker which takes the edge off of things.  She has occasional numbness and tingling to all 5 fingertips left hand.  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   Review of Systems as detailed in HPI.  All others reviewed and are negative.   Objective: Vital Signs: LMP 07/04/2014 Comment: GYN  Physical Exam well-developed and well-nourished female in no acute distress.  Alert oriented x3.  Ortho Exam exam of her left shoulder reveals full active range of motion all planes.  Positive empty can and cross body.  Marked tenderness in bicipital groove.  No pain to the [facility] joint.  Negative drop arm.  She is neurovascular intact distally.  Specialty Comments:  No specialty comments available.  Imaging: Xr Cervical Spine 2 Or 3 Views  Result Date: 02/22/2019 Moderate diffuse degenerative disc disease worse at C5-6  Xr Shoulder Left  Result Date: 02/22/2019 No acute or structural abnormalities    PMFS History: Patient Active  Problem List   Diagnosis Date Noted  . Chronic left shoulder pain 02/22/2019  . Neck pain 02/22/2019  . Hyperlipidemia associated with type 2 diabetes mellitus ([place]) 02/17/2019  . Morbid obesity ([place]) 02/17/2019  . Controlled diabetes mellitus type II without complication ([place]) 72/53/6644  . Obesity, unspecified 09/05/2013  . LEIOMYOMA, UTERUS 08/20/2008  . SICKLE-CELL TRAIT 08/20/2008  . Essential hypertension 08/20/2008   Past Medical History:  Diagnosis Date  . ALLERGIC RHINITIS 08/20/2008  . Allergy   . Anemia   . Blood transfusion without reported diagnosis 2011   post surgery  . Diabetes mellitus without complication ([place])   . DYSPNEA 11/12/2008  . Headache(784.0)  08/20/2008  . HYPERTENSION 08/20/2008  . LEIOMYOMA, UTERUS 08/20/2008  . PALPITATIONS, RECURRENT 11/12/2008  . Sickle-cell trait ([place]) 08/20/2008  . SYSTOLIC MURMUR 03/47/4259    Family History  Problem Relation Age of Onset  . Hypertension Mother   . Cancer Father        lung and prostate ca  . Hypertension Sister   . Diabetes Other   . Breast cancer Cousin        2-twins  . Colon cancer Neg Hx     Past Surgical History:  Procedure Laterality Date  . MYOMECTOMY     fibroids   Social History   Occupational History  . Not on file  Tobacco Use  . Smoking status: Never Smoker  . Smokeless tobacco: Never Used  Substance and Sexual Activity  . Alcohol use: Yes    Alcohol/week: 0.0 standard drinks    Comment: rarely  . Drug use: No  . Sexual activity: Not on file

## 2019-02-22 NOTE — Telephone Encounter (Signed)
Refill sent.

## 2019-02-22 NOTE — Telephone Encounter (Signed)
Copied from [place] [id number]. Topic: Quick Communication - Rx Refill/Question >> Feb 22, 2019  1:33 PM Scherrie Gerlach wrote: Medication: Diclofenac 1%  Pt states she was to let the dr know what crean she is using on her knee that she needs refilled, and this is it. [id number] [address], [place] [phone] (Phone) [phone] (Fax)

## 2019-04-28 ENCOUNTER — Ambulatory Visit: Payer: Self-pay | Admitting: Surgery

## 2019-04-28 NOTE — H&P (Signed)
History of Present Illness Katie Morse. Katie Jurgens MD; 04/28/2019 11:44 AM) The patient is a 53 year old female who presents with a complaint of Mass. Referred by Dr. Thersa Salt for subcutaneous lipoma left buttock  This is a 53 year old female who presents with a several year history of a protruding enlarging subcutaneous mass in her left buttock. She was originally supposed to have this removed 5-6 years ago but this was canceled due to financial reasons. The mass has become larger. Now she is developing some shooting radiating pain and numbness down her posterior left thigh whenever she sits on this area. She would like to have it removed. This area has never become infected. No recent imaging of this area. The patient is on no blood thinners.   Past Surgical History Katie Morse, [name]; 04/28/2019 11:42 AM) No pertinent past surgical history  Diagnostic Studies History Katie Morse, [name]; 04/28/2019 11:42 AM) Mammogram within last year  Allergies Katie Morse; 04/28/2019 10:33 AM) No Known Allergies [04/28/2019]: No Known Drug Allergies [04/28/2019]: Allergies Reconciled  Medication History Katie Morse; 04/28/2019 10:33 AM) Losartan Potassium (50MG  Tablet, Oral) Active. Medications Reconciled  Social History Katie Morse, [name]; 04/28/2019 11:42 AM) Alcohol use Remotely quit alcohol use. Caffeine use Carbonated beverages, Coffee, Tea. No drug use Tobacco use Never smoker.  Family History Katie Morse, [name]; 04/28/2019 11:42 AM) Arthritis Father, Mother. Bleeding disorder Family Members In General. Cancer Father. Depression Family Members In General. Hypertension Mother, Sister.  Pregnancy / Birth History Katie Morse, [name]; 04/28/2019 11:42 AM) Age at menarche 69 years. Para 0  Other Problems Katie Bers [place], [name]; 04/28/2019 11:42 AM) Back Pain Diabetes Mellitus Gastroesophageal Reflux Disease Heart  murmur High blood pressure Hypercholesterolemia Migraine Headache Transfusion history     Review of Systems Katie Morse [name]; 04/28/2019 11:42 AM) General Not Present- Appetite Loss, Chills, Fatigue, Fever, Night Sweats, Weight Gain and Weight Loss. Skin Not Present- Change in Wart/Mole, Dryness, Hives, Jaundice, New Lesions, Non-Healing Wounds, Rash and Ulcer. HEENT Present- Seasonal Allergies, Sinus Pain and Wears glasses/contact lenses. Not Present- Earache, Hearing Loss, Hoarseness, Nose Bleed, Oral Ulcers, Ringing in the Ears, Sore Throat, Visual Disturbances and Yellow Eyes. Respiratory Present- Wheezing. Not Present- Bloody sputum, Chronic Cough, Difficulty Breathing and Snoring. Breast Not Present- Breast Mass, Breast Pain, Nipple Discharge and Skin Changes. Cardiovascular Present- Leg Cramps and Shortness of Breath. Not Present- Chest Pain, Difficulty Breathing Lying Down, Palpitations, Rapid Heart Rate and Swelling of Extremities. Gastrointestinal Present- Bloating and Nausea. Not Present- Abdominal Pain, Bloody Stool, Change in Bowel Habits, Chronic diarrhea, Constipation, Difficulty Swallowing, Excessive gas, Gets full quickly at meals, Hemorrhoids, Indigestion, Rectal Pain and Vomiting. Musculoskeletal Present- Joint Stiffness. Not Present- Back Pain, Joint Pain, Muscle Pain, Muscle Weakness and Swelling of Extremities. Neurological Present- Tingling. Not Present- Decreased Memory, Fainting, Headaches, Numbness, Seizures, Tremor, Trouble walking and Weakness. Psychiatric Not Present- Anxiety, Bipolar, Change in Sleep Pattern, Depression, Fearful and Frequent crying. Endocrine Not Present- Cold Intolerance, Excessive Hunger, Hair Changes, Heat Intolerance, Hot flashes and New Diabetes. Hematology Present- Easy Bruising. Not Present- Blood Thinners, Excessive bleeding, Gland problems, HIV and Persistent Infections.  Vitals (Katie Morse Morse; 04/28/2019 10:34 AM) 04/28/2019  10:34 AM Weight: 198.4 lb Height: 62in Body Surface Area: 1.9 m Body Mass Index: 36.29 kg/m  Temp.: 98.80F(Oral)  Pulse: 97 (Regular)  BP: 192/90 (Sitting, Left Arm, Standard)      Physical Exam Katie Key K. Bethanny Toelle MD; 04/28/2019 11:44 AM)  The physical exam findings are as follows: Note:WDWN in  NAD Eyes: Pupils equal, round; sclera anicteric HENT: Oral mucosa moist; good dentition Neck: No masses palpated, no thyromegaly Lungs: CTA bilaterally; normal respiratory effort CV: Regular rate and rhythm; no murmurs; extremities well-perfused with no edema Abd: +bowel sounds, soft, non-tender, no palpable organomegaly; no palpable hernias Left buttock just above the posterior thigh there is a protruding 3 cm subcutaneous mass. This is moderately tender to palpation. No skin changes. No sign of infection. The mass is firm and smooth and mobile. Skin: Warm, dry; no sign of jaundice Psychiatric - alert and oriented x 4; calm mood and affect    Assessment & Plan Katie Key K. 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 The surgical procedure has been discussed with the patient. Potential risks, benefits, alternative treatments, and expected outcomes have been explained. All of the patient's questions at this time have been answered. The likelihood of reaching the patient's treatment goal is good. The patient understand the proposed surgical procedure and wishes to proceed.  Katie Morse. Katie Dover, MD, [facility] Surgery  General/ Trauma Surgery Beeper [phone]  04/28/2019 11:45 AM

## 2019-05-19 ENCOUNTER — Ambulatory Visit: Payer: BLUE CROSS/BLUE SHIELD

## 2019-06-01 ENCOUNTER — Ambulatory Visit: Payer: Self-pay | Admitting: *Deleted

## 2019-06-01 NOTE — Telephone Encounter (Signed)
Agree with urgent/emergent ED visit.

## 2019-06-01 NOTE — Telephone Encounter (Signed)
Patient calling with complaints of left sided chest pain for that comes and goes for the past 2 weeks. Pt describes the pain as a tight, pressure like feeling that is also accompanied by SOB. Pt states that the pain also radiates down her left arm. Pt states the the chest pain and SOB gets worse with exertion. Pt advised that she would need to seek treatment in the ED today. Pt states she may not be able to go to the ED tonight due to child care and her husband works 3rd shift.Pt advised again that it was important to seek treatment tonight due to current symptoms. Understanding verbalized.   Reason for Disposition . Pain also present in shoulder(s) or arm(s) or jaw  (Exception: pain is clearly made worse by movement)  Answer Assessment - Initial Assessment Questions 1. LOCATION: "Where does it hurt?"       Tender tight, soreness not all day 2. RADIATION: "Does the pain go anywhere else?" (e.g., into neck, jaw, arms, back)     Left side more but like in the middle, close to the sternum and down left arm 3. ONSET: "When did the chest pain begin?" (Minutes, hours or days)      Two weeks, but does get worse with exertion 4. PATTERN "Does the pain come and go, or has it been constant since it started?"  "Does it get worse with exertion?"      Constant  5. DURATION: "How long does it last" (e.g., seconds, minutes, hours)     A couple of minutes after massaging it eases off 6. SEVERITY: "How bad is the pain?"  (e.g., Scale 1-10; mild, moderate, or severe)    - MILD (1-3): doesn't interfere with normal activities     - MODERATE (4-7): interferes with normal activities or awakens from sleep    - SEVERE (8-10): excruciating pain, unable to do any normal activities       5, not intense, not sharp dull but pressure 7. CARDIAC RISK FACTORS: "Do you have any history of heart problems or risk factors for heart disease?" (e.g., prior heart attack, angina; high blood pressure, diabetes, being overweight, high  cholesterol, smoking, or strong family history of heart disease)     High cholesterol, HTN, prediabetic and a history of heart murmur and mother had a history of heart disease 8. PULMONARY RISK FACTORS: "Do you have any history of lung disease?"  (e.g., blood clots in lung, asthma, emphysema, birth control pills)     Bronchitis and allergies 9. CAUSE: "What do you think is causing the chest pain?"     Stress of mother dying in March and taking care of grandkids 10. OTHER SYMPTOMS: "Do you have any other symptoms?" (e.g., dizziness, nausea, vomiting, sweating, fever, difficulty breathing, cough)       SOB and gets worse with acitvity 11. PREGNANCY: "Is there any chance you are pregnant?" "When was your last menstrual period?"      n/a  Protocols used: CHEST PAIN-A-AH

## 2019-06-02 ENCOUNTER — Telehealth: Payer: Self-pay

## 2019-06-02 DIAGNOSIS — R079 Chest pain, unspecified: Secondary | ICD-10-CM

## 2019-06-02 NOTE — Telephone Encounter (Signed)
Copied from [place] [id number]. Topic: Referral - Request for Referral >> Jun 02, 2019  2:59 PM Yvette Rack wrote: Has patient seen PCP for this complaint? yes  *If NO, is insurance requiring patient see PCP for this issue before PCP can refer them? Referral for which specialty: Cardiologist Preferred provider/office: no specific provider/[place] Reason for referral: Pt stated she went to Urgent Care and had an EKG and she was advised to get a referral for a Cardiologist

## 2019-06-04 MED ORDER — ACCU-PRO PUMP SET/VENT MISC
2.50 | Status: DC
Start: ? — End: 2019-06-04

## 2019-06-04 MED ORDER — Medication
Status: DC
Start: ? — End: 2019-06-04

## 2019-06-04 MED ORDER — LACTASE PO
1.00 | ORAL | Status: DC
Start: ? — End: 2019-06-04

## 2019-06-04 MED ORDER — CHLOROBUTANOL-EUGENOL & APAP
0.40 | Status: DC
Start: ? — End: 2019-06-04

## 2019-06-04 MED ORDER — PREMIER CLOSED MINI-POUCH 55MM POUCH MISC
500.00 | Status: DC
Start: 2019-06-03 — End: 2019-06-04

## 2019-06-04 MED ORDER — ESTROPLUS PO TABS
1.00 | ORAL_TABLET | ORAL | Status: DC
Start: ? — End: 2019-06-04

## 2019-06-04 MED ORDER — ACETAMINOPHEN 325 MG PO TABS
650.00 | ORAL_TABLET | ORAL | Status: DC
Start: ? — End: 2019-06-04

## 2019-06-04 MED ORDER — LABETALOL HCL 5 MG/ML IV SOLN
10.00 | INTRAVENOUS | Status: DC
Start: ? — End: 2019-06-04

## 2019-06-04 MED ORDER — Medication
Status: DC
Start: 2019-06-04 — End: 2019-06-04

## 2019-06-04 MED ORDER — GENERIC EXTERNAL MEDICATION
Status: DC
Start: ? — End: 2019-06-04

## 2019-06-04 MED ORDER — NITROGLYCERIN 0.4 MG SL SUBL
0.40 | SUBLINGUAL_TABLET | SUBLINGUAL | Status: DC
Start: ? — End: 2019-06-04

## 2019-06-04 MED ORDER — ENOXAPARIN SODIUM 40 MG/0.4ML ~~LOC~~ SOLN
40.00 | SUBCUTANEOUS | Status: DC
Start: 2019-06-03 — End: 2019-06-04

## 2019-06-06 NOTE — Telephone Encounter (Signed)
Referral placed.

## 2019-06-06 NOTE — Telephone Encounter (Signed)
Definitely agree with cardiology referral

## 2019-06-10 ENCOUNTER — Other Ambulatory Visit: Payer: Self-pay

## 2019-06-10 ENCOUNTER — Ambulatory Visit
Admission: RE | Admit: 2019-06-10 | Discharge: 2019-06-10 | Disposition: A | Discharge status: Home / Self Care | Payer: BLUE CROSS/BLUE SHIELD | Source: Ambulatory Visit | Attending: Internal Medicine | Admitting: Internal Medicine

## 2019-06-10 DIAGNOSIS — Z803 Family history of malignant neoplasm of breast: Secondary | ICD-10-CM

## 2019-06-10 DIAGNOSIS — Z1239 Encounter for other screening for malignant neoplasm of breast: Secondary | ICD-10-CM

## 2019-07-11 ENCOUNTER — Encounter (HOSPITAL_BASED_OUTPATIENT_CLINIC_OR_DEPARTMENT_OTHER): Payer: Self-pay | Admitting: *Deleted

## 2019-07-11 ENCOUNTER — Other Ambulatory Visit: Payer: Self-pay

## 2019-07-14 ENCOUNTER — Telehealth: Payer: Self-pay | Admitting: Internal Medicine

## 2019-07-14 NOTE — Telephone Encounter (Signed)
I need more information and she needs an office visit before I will order a stress test. Is she having CP? Does she ned to be seen in the ED?

## 2019-07-14 NOTE — Telephone Encounter (Signed)
General/Other - Orders  The patient needs orders placed for a stress test. Please call the patient back

## 2019-07-15 ENCOUNTER — Other Ambulatory Visit (HOSPITAL_COMMUNITY)
Admission: RE | Admit: 2019-07-15 | Discharge: 2019-07-15 | Disposition: A | Discharge status: Home / Self Care | Payer: BC Managed Care – PPO | Source: Ambulatory Visit | Attending: Surgery | Admitting: Surgery

## 2019-07-15 DIAGNOSIS — Z1159 Encounter for screening for other viral diseases: Secondary | ICD-10-CM | POA: Diagnosis not present

## 2019-07-15 LAB — SARS CORONAVIRUS 2 (TAT 6-24 HRS): SARS Coronavirus 2: NEGATIVE

## 2019-07-18 NOTE — Telephone Encounter (Signed)
Spoke with patient and it was recommended by the ER doctor to have a stress test.  Appointment offered, but patient declined due to a scheduled surgery 07/19/2019.

## 2019-07-19 ENCOUNTER — Other Ambulatory Visit: Payer: Self-pay

## 2019-07-19 ENCOUNTER — Encounter (HOSPITAL_BASED_OUTPATIENT_CLINIC_OR_DEPARTMENT_OTHER): Payer: Self-pay | Admitting: Certified Registered"

## 2019-07-19 ENCOUNTER — Ambulatory Visit (HOSPITAL_BASED_OUTPATIENT_CLINIC_OR_DEPARTMENT_OTHER)
Admission: RE | Admit: 2019-07-19 | Discharge: 2019-07-19 | Disposition: A | Discharge status: Home / Self Care | Payer: BC Managed Care – PPO | Attending: Surgery | Admitting: Surgery

## 2019-07-19 ENCOUNTER — Ambulatory Visit (HOSPITAL_BASED_OUTPATIENT_CLINIC_OR_DEPARTMENT_OTHER): Payer: BC Managed Care – PPO | Admitting: Certified Registered"

## 2019-07-19 ENCOUNTER — Encounter (HOSPITAL_BASED_OUTPATIENT_CLINIC_OR_DEPARTMENT_OTHER)
Admission: RE | Disposition: A | Discharge status: Home / Self Care | Payer: Self-pay | Source: Home / Self Care | Attending: Surgery

## 2019-07-19 DIAGNOSIS — Z7982 Long term (current) use of aspirin: Secondary | ICD-10-CM | POA: Diagnosis not present

## 2019-07-19 DIAGNOSIS — E7800 Pure hypercholesterolemia, unspecified: Secondary | ICD-10-CM | POA: Insufficient documentation

## 2019-07-19 DIAGNOSIS — D171 Benign lipomatous neoplasm of skin and subcutaneous tissue of trunk: Secondary | ICD-10-CM | POA: Diagnosis not present

## 2019-07-19 DIAGNOSIS — I10 Essential (primary) hypertension: Secondary | ICD-10-CM | POA: Diagnosis not present

## 2019-07-19 DIAGNOSIS — Z79899 Other long term (current) drug therapy: Secondary | ICD-10-CM | POA: Insufficient documentation

## 2019-07-19 DIAGNOSIS — K219 Gastro-esophageal reflux disease without esophagitis: Secondary | ICD-10-CM | POA: Insufficient documentation

## 2019-07-19 DIAGNOSIS — E119 Type 2 diabetes mellitus without complications: Secondary | ICD-10-CM | POA: Diagnosis not present

## 2019-07-19 HISTORY — PX: LIPOMA EXCISION: SHX5283

## 2019-07-19 SURGERY — EXCISION LIPOMA
Anesthesia: General | Site: Buttocks | Laterality: Left

## 2019-07-19 MED ORDER — OXYCODONE HCL 5 MG/5ML PO SOLN: 5.0000 mg | Freq: Once | ORAL | Status: DC | PRN

## 2019-07-19 MED ORDER — LACTATED RINGERS IV SOLN
INTRAVENOUS | Status: DC
  Administered 2019-07-19: 08:00:00 via INTRAVENOUS

## 2019-07-19 MED ORDER — CHLORHEXIDINE GLUCONATE CLOTH 2 % EX PADS: 6.0000 | MEDICATED_PAD | Freq: Once | CUTANEOUS | Status: DC

## 2019-07-19 MED ORDER — FENTANYL CITRATE (PF) 100 MCG/2ML IJ SOLN
INTRAMUSCULAR | Status: AC
  Filled 2019-07-19: qty 2

## 2019-07-19 MED ORDER — DEXAMETHASONE SODIUM PHOSPHATE 4 MG/ML IJ SOLN
INTRAMUSCULAR | Status: DC | PRN
  Administered 2019-07-19: 4 mg via INTRAVENOUS

## 2019-07-19 MED ORDER — LIDOCAINE HCL (CARDIAC) PF 100 MG/5ML IV SOSY
PREFILLED_SYRINGE | INTRAVENOUS | Status: DC | PRN
  Administered 2019-07-19: 60 mg via INTRAVENOUS

## 2019-07-19 MED ORDER — FENTANYL CITRATE (PF) 100 MCG/2ML IJ SOLN: 25.0000 ug | INTRAMUSCULAR | Status: DC | PRN

## 2019-07-19 MED ORDER — ACETAMINOPHEN 10 MG/ML IV SOLN: 1000.0000 mg | Freq: Once | INTRAVENOUS | Status: DC | PRN

## 2019-07-19 MED ORDER — CEFAZOLIN SODIUM-DEXTROSE 2-4 GM/100ML-% IV SOLN
2.0000 g | INTRAVENOUS | Status: AC
  Administered 2019-07-19: 2 g via INTRAVENOUS

## 2019-07-19 MED ORDER — ACETAMINOPHEN 160 MG/5ML PO SOLN: 1000.0000 mg | Freq: Once | ORAL | Status: DC | PRN

## 2019-07-19 MED ORDER — BUPIVACAINE-EPINEPHRINE (PF) 0.25% -1:200000 IJ SOLN
INTRAMUSCULAR | Status: AC
  Filled 2019-07-19: qty 30

## 2019-07-19 MED ORDER — HYDROCODONE-ACETAMINOPHEN 5-325 MG PO TABS: 1.0000 | ORAL_TABLET | Freq: Four times a day (QID) | ORAL | 0 refills | Status: DC | PRN

## 2019-07-19 MED ORDER — CEFAZOLIN SODIUM-DEXTROSE 2-4 GM/100ML-% IV SOLN
INTRAVENOUS | Status: AC
  Filled 2019-07-19: qty 100

## 2019-07-19 MED ORDER — PROPOFOL 10 MG/ML IV BOLUS
INTRAVENOUS | Status: DC | PRN
  Administered 2019-07-19: 160 mg via INTRAVENOUS

## 2019-07-19 MED ORDER — MIDAZOLAM HCL 5 MG/5ML IJ SOLN
INTRAMUSCULAR | Status: DC | PRN
  Administered 2019-07-19: 2 mg via INTRAVENOUS

## 2019-07-19 MED ORDER — BUPIVACAINE-EPINEPHRINE 0.25% -1:200000 IJ SOLN
INTRAMUSCULAR | Status: DC | PRN
  Administered 2019-07-19: 10 mL

## 2019-07-19 MED ORDER — OXYCODONE HCL 5 MG PO TABS: 5.0000 mg | ORAL_TABLET | Freq: Once | ORAL | Status: DC | PRN

## 2019-07-19 MED ORDER — ONDANSETRON HCL 4 MG/2ML IJ SOLN
INTRAMUSCULAR | Status: DC | PRN
  Administered 2019-07-19: 4 mg via INTRAVENOUS

## 2019-07-19 MED ORDER — FENTANYL CITRATE (PF) 100 MCG/2ML IJ SOLN
INTRAMUSCULAR | Status: DC | PRN
  Administered 2019-07-19 (×2): 50 ug via INTRAVENOUS

## 2019-07-19 MED ORDER — ACETAMINOPHEN 500 MG PO TABS: 1000.0000 mg | ORAL_TABLET | Freq: Once | ORAL | Status: DC | PRN

## 2019-07-19 MED ORDER — MIDAZOLAM HCL 2 MG/2ML IJ SOLN
INTRAMUSCULAR | Status: AC
  Filled 2019-07-19: qty 2

## 2019-07-19 SURGICAL SUPPLY — 48 items
APL PRP STRL LF DISP 70% ISPRP (MISCELLANEOUS) ×1
APL SKNCLS STERI-STRIP NONHPOA (GAUZE/BANDAGES/DRESSINGS) ×1
BENZOIN TINCTURE PRP APPL 2/3 (GAUZE/BANDAGES/DRESSINGS) ×3 IMPLANT
BLADE SURG 15 STRL LF DISP TIS (BLADE) ×1 IMPLANT
BLADE SURG 15 STRL SS (BLADE) ×3
CANISTER SUCT 1200ML W/VALVE (MISCELLANEOUS) IMPLANT
CHLORAPREP W/TINT 26 (MISCELLANEOUS) ×3 IMPLANT
CLOSURE WOUND 1/2 X4 (GAUZE/BANDAGES/DRESSINGS) ×1
COVER BACK TABLE REUSABLE LG (DRAPES) ×3 IMPLANT
COVER MAYO STAND REUSABLE (DRAPES) ×3 IMPLANT
COVER WAND RF STERILE (DRAPES) IMPLANT
DECANTER SPIKE VIAL GLASS SM (MISCELLANEOUS) IMPLANT
DRAPE LAPAROTOMY 100X72 PEDS (DRAPES) ×3 IMPLANT
DRAPE UTILITY XL STRL (DRAPES) ×3 IMPLANT
DRSG TEGADERM 4X4.75 (GAUZE/BANDAGES/DRESSINGS) ×3 IMPLANT
ELECT COATED BLADE 2.86 ST (ELECTRODE) ×3 IMPLANT
ELECT REM PT RETURN 9FT ADLT (ELECTROSURGICAL) ×3
ELECTRODE REM PT RTRN 9FT ADLT (ELECTROSURGICAL) ×1 IMPLANT
GAUZE SPONGE 4X4 12PLY STRL LF (GAUZE/BANDAGES/DRESSINGS) IMPLANT
GLOVE BIOGEL PI IND STRL 7.0 (GLOVE) IMPLANT
GLOVE BIOGEL PI IND STRL 7.5 (GLOVE) ×1 IMPLANT
GLOVE BIOGEL PI INDICATOR 7.0 (GLOVE) ×2
GLOVE BIOGEL PI INDICATOR 7.5 (GLOVE) ×2
GLOVE SURG SS PI 6.5 STRL IVOR (GLOVE) ×2 IMPLANT
GLOVE SURG SS PI 7.0 STRL IVOR (GLOVE) ×2 IMPLANT
GOWN STRL REUS W/ TWL LRG LVL3 (GOWN DISPOSABLE) ×2 IMPLANT
GOWN STRL REUS W/TWL LRG LVL3 (GOWN DISPOSABLE) ×6
NDL HYPO 25X1 1.5 SAFETY (NEEDLE) ×1 IMPLANT
NEEDLE HYPO 25X1 1.5 SAFETY (NEEDLE) ×3 IMPLANT
NS IRRIG 1000ML POUR BTL (IV SOLUTION) ×2 IMPLANT
PACK BASIN DAY SURGERY FS (CUSTOM PROCEDURE TRAY) ×3 IMPLANT
PENCIL BUTTON HOLSTER BLD 10FT (ELECTRODE) ×3 IMPLANT
SLEEVE SCD COMPRESS KNEE MED (MISCELLANEOUS) ×2 IMPLANT
SPONGE GAUZE 2X2 8PLY STER LF (GAUZE/BANDAGES/DRESSINGS) ×1
SPONGE GAUZE 2X2 8PLY STRL LF (GAUZE/BANDAGES/DRESSINGS) ×1 IMPLANT
STRIP CLOSURE SKIN 1/2X4 (GAUZE/BANDAGES/DRESSINGS) ×2 IMPLANT
SUT MON AB 4-0 PC3 18 (SUTURE) ×2 IMPLANT
SUT PROLENE 6 0 P 1 18 (SUTURE) IMPLANT
SUT SILK 2 0 PERMA HAND 18 BK (SUTURE) IMPLANT
SUT VIC AB 3-0 SH 27 (SUTURE)
SUT VIC AB 3-0 SH 27X BRD (SUTURE) IMPLANT
SUT VICRYL 3-0 CR8 SH (SUTURE) ×2 IMPLANT
SYR BULB 3OZ (MISCELLANEOUS) ×3 IMPLANT
SYR CONTROL 10ML LL (SYRINGE) ×3 IMPLANT
TOWEL GREEN STERILE FF (TOWEL DISPOSABLE) ×3 IMPLANT
TUBE CONNECTING 20'X1/4 (TUBING)
TUBE CONNECTING 20X1/4 (TUBING) IMPLANT
YANKAUER SUCT BULB TIP NO VENT (SUCTIONS) IMPLANT

## 2019-07-19 NOTE — Transfer of Care (Signed)
Immediate Anesthesia Transfer of Care Note  Patient: Katie Morse  Procedure(s) Performed: EXCISION OF SUBCUTANEOUS LIPOMA LEFT BUTTOCK (Left Buttocks)  Patient Location: PACU  Anesthesia Type:General  Level of Consciousness: awake, alert , oriented and patient cooperative  Airway & Oxygen Therapy: Patient Spontanous Breathing and Patient connected to face mask oxygen  Post-op Assessment: Report given to RN and Post -op Vital signs reviewed and stable  Post vital signs: Reviewed and stable  Last Vitals:  Vitals Value Taken Time  BP    Temp    Pulse    Resp    SpO2      Last Pain:  Vitals:   07/19/19 0741  TempSrc: Oral  PainSc: 3       Patients Stated Pain Goal: 3 (62/19/47 1252)  Complications: No apparent anesthesia complications

## 2019-07-19 NOTE — H&P (Signed)
History of Present Illness  The patient is a 53 year old female who presents with a complaint of Mass. Referred by Dr. Thersa Salt for subcutaneous lipoma left buttock  This is a 53 year old female who presents with a several year history of a protruding enlarging subcutaneous mass in her left buttock. She was originally supposed to have this removed 5-6 years ago but this was canceled due to financial reasons. The mass has become larger. Now she is developing some shooting radiating pain and numbness down her posterior left thigh whenever she sits on this area. She would like to have it removed. This area has never become infected. No recent imaging of this area. The patient is on no blood thinners.   Past Surgical History  No pertinent past surgical history  Diagnostic Studies History  Mammogram within last year  Allergies  No Known Allergies  No Known Drug Allergies: Allergies Reconciled  Medication History Losartan Potassium (50MG  Tablet, Oral) Active. Medications Reconciled  Social History  Alcohol use Remotely quit alcohol use. Caffeine use Carbonated beverages, Coffee, Tea. No drug use Tobacco use Never smoker.  Family History Arthritis Father, Mother. Bleeding disorder Family Members In General. Cancer Father. Depression Family Members In General. Hypertension Mother, Sister.  Pregnancy / Birth History  Age at menarche 35 years. Para 0  Other Problems Back Pain Diabetes Mellitus Gastroesophageal Reflux Disease Heart murmur High blood pressure Hypercholesterolemia Migraine Headache Transfusion history     Review of Systems  General Not Present- Appetite Loss, Chills, Fatigue, Fever, Night Sweats, Weight Gain and Weight Loss. Skin Not Present- Change in Wart/Mole, Dryness, Hives, Jaundice, New Lesions, Non-Healing Wounds, Rash and Ulcer. HEENT Present- Seasonal Allergies, Sinus Pain and Wears  glasses/contact lenses. Not Present- Earache, Hearing Loss, Hoarseness, Nose Bleed, Oral Ulcers, Ringing in the Ears, Sore Throat, Visual Disturbances and Yellow Eyes. Respiratory Present- Wheezing. Not Present- Bloody sputum, Chronic Cough, Difficulty Breathing and Snoring. Breast Not Present- Breast Mass, Breast Pain, Nipple Discharge and Skin Changes. Cardiovascular Present- Leg Cramps and Shortness of Breath. Not Present- Chest Pain, Difficulty Breathing Lying Down, Palpitations, Rapid Heart Rate and Swelling of Extremities. Gastrointestinal Present- Bloating and Nausea. Not Present- Abdominal Pain, Bloody Stool, Change in Bowel Habits, Chronic diarrhea, Constipation, Difficulty Swallowing, Excessive gas, Gets full quickly at meals, Hemorrhoids, Indigestion, Rectal Pain and Vomiting. Musculoskeletal Present- Joint Stiffness. Not Present- Back Pain, Joint Pain, Muscle Pain, Muscle Weakness and Swelling of Extremities. Neurological Present- Tingling. Not Present- Decreased Memory, Fainting, Headaches, Numbness, Seizures, Tremor, Trouble walking and Weakness. Psychiatric Not Present- Anxiety, Bipolar, Change in Sleep Pattern, Depression, Fearful and Frequent crying. Endocrine Not Present- Cold Intolerance, Excessive Hunger, Hair Changes, Heat Intolerance, Hot flashes and New Diabetes. Hematology Present- Easy Bruising. Not Present- Blood Thinners, Excessive bleeding, Gland problems, HIV and Persistent Infections.  Vitals  Weight: 198.4 lb Height: 62in Body Surface Area: 1.9 m Body Mass Index: 36.29 kg/m  Temp.: 98.43F(Oral)  Pulse: 97 (Regular)  BP: 192/90 (Sitting, Left Arm, Standard)      Physical Exam   The physical exam findings are as follows: Note:WDWN in NAD Eyes: Pupils equal, round; sclera anicteric HENT: Oral mucosa moist; good dentition Neck: No masses palpated, no thyromegaly Lungs: CTA bilaterally; normal respiratory effort CV: Regular rate and  rhythm; no murmurs; extremities well-perfused with no edema Abd: +bowel sounds, soft, non-tender, no palpable organomegaly; no palpable hernias Left buttock just above the posterior thigh there is a protruding 3 cm subcutaneous mass. This is moderately tender to palpation.  No skin changes. No sign of infection. The mass is firm and smooth and mobile. 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 The surgical procedure has been discussed with the patient. Potential risks, benefits, alternative treatments, and expected outcomes have been explained. All of the patient's questions at this time have been answered. The likelihood of reaching the patient's treatment goal is good. The patient understand the proposed surgical procedure and wishes to proceed.   Imogene Burn. Georgette Dover, MD, [facility] Surgery  General/ Trauma Surgery Beeper [phone]  07/19/2019 8:15 AM

## 2019-07-19 NOTE — Anesthesia Postprocedure Evaluation (Signed)
Anesthesia Post Note  Patient: Katie Morse  Procedure(s) Performed: EXCISION OF SUBCUTANEOUS LIPOMA LEFT BUTTOCK (Left Buttocks)     Patient location during evaluation: PACU Anesthesia Type: General Level of consciousness: awake and alert Pain management: pain level controlled Vital Signs Assessment: post-procedure vital signs reviewed and stable Respiratory status: spontaneous breathing, nonlabored ventilation, respiratory function stable and patient connected to nasal cannula oxygen Cardiovascular status: blood pressure returned to baseline and stable Postop Assessment: no apparent nausea or vomiting Anesthetic complications: no    Last Vitals:  Vitals:   07/19/19 1000 07/19/19 1037  BP: (!) 144/68 (!) 158/89  Pulse: 62 64  Resp: (!) 26 18  Temp:  36.6 C  SpO2: 98% 98%    Last Pain:  Vitals:   07/19/19 1037  TempSrc:   PainSc: 0-No pain                 Shedric Fredericks

## 2019-07-19 NOTE — Discharge Instructions (Signed)
[facility] °Office Phone Number [phone] ° °Lipoma Excision: POST OP INSTRUCTIONS ° °Always review your discharge instruction sheet given to you by the facility where your surgery was performed. ° °IF YOU HAVE DISABILITY OR FAMILY LEAVE FORMS, YOU MUST BRING THEM TO THE OFFICE FOR PROCESSING.  DO NOT GIVE THEM TO YOUR DOCTOR. ° °1. A prescription for pain medication may be given to you upon discharge.  Take your pain medication as prescribed, if needed.  If narcotic pain medicine is not needed, then you may take acetaminophen (Tylenol) or ibuprofen (Advil) as needed. °2. Take your usually prescribed medications unless otherwise directed °3. If you need a refill on your pain medication, please contact your pharmacy.  They will contact our office to request authorization.  Prescriptions will not be filled after 5pm or on week-ends. °4. You should eat very light the first 24 hours after surgery, such as soup, crackers, pudding, etc.  Resume your normal diet the day after surgery. °5. Most patients will experience some swelling and bruising around the surgical site.  Ice packs will help.  Swelling and bruising can take several days to resolve.  °6. It is common to experience some constipation if taking pain medication after surgery.  Increasing fluid intake and taking a stool softener will usually help or prevent this problem from occurring.  A mild laxative (Milk of Magnesia or Miralax) should be taken according to package directions if there are no bowel movements after 48 hours. °7. You may remove your bandages 48 hours after surgery, and you may shower at that time.  You will have steri-strips (small skin tapes) in place directly over the incision.  These strips should be left on the skin for 7-10 days.   °8. ACTIVITIES:  You may resume regular daily activities (gradually increasing) beginning the next day.   You may have sexual intercourse when it is comfortable. °a. You may drive when you no  longer are taking prescription pain medication, you can comfortably wear a seatbelt, and you can safely maneuver your car and apply brakes. °b. RETURN TO WORK:  1-2 weeks °9. You should see your doctor in the office for a follow-up appointment approximately two to three weeks after your surgery.   ° °WHEN TO CALL YOUR DOCTOR: °1. Fever over 101.0 °2. Nausea and/or vomiting. °3. Extreme swelling or bruising. °4. Continued bleeding from incision. °5. Increased pain, redness, or drainage from the incision. ° °The clinic staff is available to answer your questions during regular business hours.  Please don’t hesitate to call and ask to speak to one of the nurses for clinical concerns.  If you have a medical emergency, go to the nearest emergency room or call 911.  A surgeon from [facility] is always on call at the hospital. ° °For further questions, please visit [email or web address]  ° ° ° ° ° °Post Anesthesia Home Care Instructions ° °Activity: °Get plenty of rest for the remainder of the day. A responsible individual must stay with you for 24 hours following the procedure.  °For the next 24 hours, DO NOT: °-Drive a car °-Operate machinery °-Drink alcoholic beverages °-Take any medication unless instructed by your physician °-Make any legal decisions or sign important papers. ° °Meals: °Start with liquid foods such as gelatin or soup. Progress to regular foods as tolerated. Avoid greasy, spicy, heavy foods. If nausea and/or vomiting occur, drink only clear liquids until the nausea and/or vomiting subsides. Call your physician if vomiting continues. ° °  Special Instructions/Symptoms: °Your throat may feel dry or sore from the anesthesia or the breathing tube placed in your throat during surgery. If this causes discomfort, gargle with warm salt water. The discomfort should disappear within 24 hours. ° °If you had a scopolamine patch placed behind your ear for the management of post- operative nausea  and/or vomiting: ° °1. The medication in the patch is effective for 72 hours, after which it should be removed.  Wrap patch in a tissue and discard in the trash. Wash hands thoroughly with soap and water. °2. You may remove the patch earlier than 72 hours if you experience unpleasant side effects which may include dry mouth, dizziness or visual disturbances. °3. Avoid touching the patch. Wash your hands with soap and water after contact with the patch. °  ° °

## 2019-07-19 NOTE — Op Note (Signed)
Pre-op diagnosis: 3 cm subcutaneous lipoma left buttock Postop diagnosis: Same Procedure performed: Excision of subcutaneous lipoma left buttock Surgeon:Judi Jaffe K Tilla Wilborn Anesthesia: General Indications: This is a 53 year old female who presents with several years of an enlarging mass in her left buttock.  It has progressed to the point that it is causing pain radiating down her thigh.  She presents now for excision.  Description of procedure: The patient is brought to the operating room and placed in supine position on the operating room table on a beanbag.  After adequate level of general anesthesia was obtained, she was moved to a lateral position on her right side.  Her left buttock and upper thigh were prepped with ChloraPrep and draped sterile fashion.  A timeout was taken to ensure the proper patient and proper procedure.  The patient has a visible bulge in her lower buttock.  The palpable mass extends laterally.  We anesthetized this area with with 0.25% Marcaine with epinephrine.  A transverse incision was made.  We dissected through the dermis and encountered the surface of the lipoma.  There is no obvious capsule.  There are a lot of appendages coming off of the main body of the lipoma.  I bluntly dissected around the lipoma and we excised it.  I excised some of the surrounding tissue to make sure had all of the small appendages.  No further palpable masses were noted.  We inspect for hemostasis.  The wound was closed with 3-0 Vicryl and 4-0 Monocryl.  Benzoin Steri-Strips were applied.  The patient was then moved back to supine position.  She was extubated and brought to the recovery room in stable condition.  All sponge, instrument, and needle counts are correct.  Imogene Burn. Georgette Dover, MD, [facility] [phone]  07/19/2019 9:21 AM

## 2019-07-19 NOTE — Anesthesia Preprocedure Evaluation (Signed)
Anesthesia Evaluation  Patient identified by MRN, date of birth, ID band Patient awake    Reviewed: Allergy & Precautions, NPO status , Patient's Chart, lab work & pertinent test results  History of Anesthesia Complications Negative for: history of anesthetic complications  Airway Mallampati: II  TM Distance: >3 FB Neck ROM: Full    Dental  (+) Dental Advisory Given   Pulmonary neg COPD, neg recent URI,    breath sounds clear to auscultation       Cardiovascular hypertension, Pt. on medications (-) angina(-) Past MI and (-) CHF  Rhythm:Regular     Neuro/Psych  Headaches, neg Seizures negative psych ROS   GI/Hepatic negative GI ROS, Neg liver ROS,   Endo/Other  diabetes  Renal/GU negative Renal ROS     Musculoskeletal   Abdominal   Peds  Hematology  (+) Blood dyscrasia, anemia ,   Anesthesia Other Findings   Reproductive/Obstetrics                             Anesthesia Physical Anesthesia Plan  ASA: II  Anesthesia Plan: General   Post-op Pain Management:    Induction: Intravenous  PONV Risk Score and Plan: 3 and Ondansetron and Dexamethasone  Airway Management Planned: LMA and Oral ETT  Additional Equipment: None  Intra-op Plan:   Post-operative Plan: Extubation in OR  Informed Consent: I have reviewed the patients History and Physical, chart, labs and discussed the procedure including the risks, benefits and alternatives for the proposed anesthesia with the patient or authorized representative who has indicated his/her understanding and acceptance.     Dental advisory given  Plan Discussed with: CRNA and Surgeon  Anesthesia Plan Comments:         Anesthesia Quick Evaluation

## 2019-07-19 NOTE — Anesthesia Procedure Notes (Signed)
Procedure Name: LMA Insertion Date/Time: 07/19/2019 8:41 AM Performed by: Signe Colt, CRNA Pre-anesthesia Checklist: Patient identified, Emergency Drugs available, Suction available and Patient being monitored Patient Re-evaluated:Patient Re-evaluated prior to induction Oxygen Delivery Method: Circle system utilized Preoxygenation: Pre-oxygenation with 100% oxygen Induction Type: IV induction Ventilation: Mask ventilation without difficulty LMA: LMA inserted LMA Size: 4.0 Number of attempts: 1 Airway Equipment and Method: Bite block Placement Confirmation: positive ETCO2 Tube secured with: Tape Dental Injury: Teeth and Oropharynx as per pre-operative assessment

## 2019-07-20 ENCOUNTER — Encounter (HOSPITAL_BASED_OUTPATIENT_CLINIC_OR_DEPARTMENT_OTHER): Payer: Self-pay | Admitting: Surgery

## 2019-07-30 NOTE — Progress Notes (Deleted)
Cardiology Office Note:    Date:  07/30/2019   ID:  Katie Morse, DOB Mar 17, 1966, MRN [id number]  PCP:  Katie Morse, Katie Halsted, MD  Cardiologist:  Katie More, MD   Referring MD: Katie Morse, Estel*  ASSESSMENT:    No diagnosis found. PLAN:    In order of problems listed above:  1. ***  Next appointment   Medication Adjustments/Labs and Tests Ordered: Current medicines are reviewed at length with the patient today.  Concerns regarding medicines are outlined above.  No orders of the defined types were placed in this encounter.  No orders of the defined types were placed in this encounter.    No chief complaint on file. ***  History of Present Illness:    Katie Morse is a 53 y.o. female with hypertension hyperlipidemia and type 2 diabetes who is being seen today for the evaluation of chest pain at the request of Katie Morse, Estel*.  She was seen at [facility] ED for chest pain 06/04/2019.  EKG did not show ischemic changes troponin was normal.  She was admitted to the hospital serial cardiac enzymes were performed and found to be normal.  Echocardiogram showed normal left ventricular function.  She was recommended for outpatient ischemia evaluation.  Valve structure and function was normal and there is no evidence of pulmonary artery hypertension.  She was mildly anemic with a hemoglobin of 11.6 and microcytic indices.  Creatinine 0.51 GFR 126 cc/min potassium 3.9.  Lipid profile cholesterol 192 LDL 121 HDL 56, proBNP level was quite low normal at 12 and 3 troponin I assays were performed all nondetectable.  A chest x-ray was normal  Past Medical History:  Diagnosis Date  . ALLERGIC RHINITIS 08/20/2008  . Allergy   . Anemia   . Blood transfusion without reported diagnosis 2011   post surgery  . Diabetes mellitus without complication ([place])   . DYSPNEA 11/12/2008  . Headache(784.0) 08/20/2008  . HYPERTENSION 08/20/2008  .  LEIOMYOMA, UTERUS 08/20/2008  . PALPITATIONS, RECURRENT 11/12/2008  . Sickle-cell trait ([place]) 08/20/2008  . SYSTOLIC MURMUR 70/62/3762    Past Surgical History:  Procedure Laterality Date  . LIPOMA EXCISION Left 07/19/2019   Procedure: EXCISION OF SUBCUTANEOUS LIPOMA LEFT BUTTOCK;  Surgeon: Donnie Mesa, MD;  Location: [facility];  Service: General;  Laterality: Left;  . MYOMECTOMY     fibroids    Current Medications: No outpatient medications have been marked as taking for the 07/31/19 encounter (Appointment) with Richardo Priest, MD.     Allergies:   Latex   Social History   Socioeconomic History  . Marital status: Married    Spouse name: Not on file  . Number of children: Not on file  . Years of education: Not on file  . Highest education level: Not on file  Occupational History  . Not on file  Social Needs  . Financial resource strain: Not on file  . Food insecurity    Worry: Not on file    Inability: Not on file  . Transportation needs    Medical: Not on file    Non-medical: Not on file  Tobacco Use  . Smoking status: Never Smoker  . Smokeless tobacco: Never Used  Substance and Sexual Activity  . Alcohol use: Yes    Alcohol/week: 0.0 standard drinks    Comment: rarely  . Drug use: No  . Sexual activity: Not on file  Lifestyle  . Physical activity    Days  per week: Not on file    Minutes per session: Not on file  . Stress: Not on file  Relationships  . Social Herbalist on phone: Not on file    Gets together: Not on file    Attends religious service: Not on file    Active member of club or organization: Not on file    Attends meetings of clubs or organizations: Not on file    Relationship status: Not on file  Other Topics Concern  . Not on file  Social History Narrative  . Not on file     Family History: The patient's ***family history includes Breast cancer in her cousin; Cancer in her father; Diabetes in an other family  member; Hypertension in her mother and sister. There is no history of Colon cancer.  ROS:   ROS Please see the history of present illness.    *** All other systems reviewed and are negative.  EKGs/Labs/Other Studies Reviewed:    The following studies were reviewed today: ***  EKG:  EKG is *** ordered today.  The ekg ordered today is personally reviewed and demonstrates ***  Recent Labs: No results found for requested labs within last 8760 hours.  Recent Lipid Panel    Component Value Date/Time   CHOL 203 (H) 02/05/2017 1008   TRIG 62.0 02/05/2017 1008   HDL 67.60 02/05/2017 1008   CHOLHDL 3 02/05/2017 1008   VLDL 12.4 02/05/2017 1008   LDLCALC 123 (H) 02/05/2017 1008   LDLDIRECT 135.0 05/11/2012 0909    Physical Exam:    VS:  LMP 07/04/2014 Comment: GYN    Wt Readings from Last 3 Encounters:  07/19/19 194 lb 14.2 oz (88.4 kg)  02/17/19 195 lb 11.2 oz (88.8 kg)  08/17/18 186 lb 6.4 oz (84.6 kg)     GEN: *** Well nourished, well developed in no acute distress HEENT: Normal NECK: No JVD; No carotid bruits LYMPHATICS: No lymphadenopathy CARDIAC: ***RRR, no murmurs, rubs, gallops RESPIRATORY:  Clear to auscultation without rales, wheezing or rhonchi  ABDOMEN: Soft, non-tender, non-distended MUSCULOSKELETAL:  No edema; No deformity  SKIN: Warm and dry NEUROLOGIC:  Alert and oriented x 3 PSYCHIATRIC:  Normal affect     Signed, Katie More, MD  07/30/2019 11:51 AM    [place]

## 2019-07-31 ENCOUNTER — Other Ambulatory Visit: Payer: Self-pay

## 2019-07-31 ENCOUNTER — Ambulatory Visit (INDEPENDENT_AMBULATORY_CARE_PROVIDER_SITE_OTHER): Payer: BC Managed Care – PPO | Admitting: Cardiology

## 2019-07-31 DIAGNOSIS — I10 Essential (primary) hypertension: Secondary | ICD-10-CM

## 2019-08-03 ENCOUNTER — Encounter: Payer: Self-pay | Admitting: Cardiology

## 2019-08-03 ENCOUNTER — Ambulatory Visit (INDEPENDENT_AMBULATORY_CARE_PROVIDER_SITE_OTHER): Payer: BC Managed Care – PPO | Admitting: Cardiology

## 2019-08-03 ENCOUNTER — Other Ambulatory Visit: Payer: Self-pay

## 2019-08-03 VITALS — BP 136/94 | HR 75 | Ht 62.0 in | Wt 194.1 lb

## 2019-08-03 DIAGNOSIS — R079 Chest pain, unspecified: Secondary | ICD-10-CM | POA: Diagnosis not present

## 2019-08-03 DIAGNOSIS — I10 Essential (primary) hypertension: Secondary | ICD-10-CM | POA: Diagnosis not present

## 2019-08-03 DIAGNOSIS — E782 Mixed hyperlipidemia: Secondary | ICD-10-CM

## 2019-08-03 MED ORDER — METOPROLOL TARTRATE 50 MG PO TABS: ORAL_TABLET | ORAL | 0 refills | Status: DC

## 2019-08-03 NOTE — Patient Instructions (Addendum)
Medication Instructions:  No medication changes today.    If you need a refill on your cardiac medications before your next appointment, please call your pharmacy.   Lab work: Your physician recommends that you return for lab work 3-7 days before your cardiac CTA: BMET. No appointment needed, no need to fast beforehand.  If you have labs (blood work) drawn today and your tests are completely normal, you will receive your results only by: Marland Kitchen MyChart Message (if you have MyChart) OR . A paper copy in the mail If you have any lab test that is abnormal or we need to change your treatment, we will call you to review the results.  Testing/Procedures: You had an EKG today.   Your physician has requested that you have cardiac CT. Cardiac computed tomography (CT) is a painless test that uses an x-ray machine to take clear, detailed pictures of your heart. For further information please visit [email or web address]. Please follow instruction sheet as given.   Follow-Up: At [facility], you and your health needs are our priority.  As part of our continuing mission to provide you with exceptional heart care, we have created designated Provider Care Teams.  These Care Teams include your primary Cardiologist (physician) and Advanced Practice Providers (APPs -  Physician Assistants and Nurse Practitioners) who all work together to provide you with the care you need, when you need it. . You will need a follow up appointment in 6 weeks.   Any Other Special Instructions Will Be Listed Below (If Applicable).  Your cardiac CT will be scheduled at:   [facility] [address] [place], [place] [address] [phone]  Please arrive at the [facility] main entrance of [facility] 30-45 minutes prior to test start time. Proceed to the [facility] Radiology Department (first floor) to check-in and test prep.  Please follow these instructions carefully (unless otherwise directed):  On  the Night Before the Test: . Be sure to Drink plenty of water. . Do not consume any caffeinated/decaffeinated beverages or chocolate 12 hours prior to your test. . Do not take any antihistamines 12 hours prior to your test.  On the Day of the Test: . Drink plenty of water. Do not drink any water within one hour of the test. . Do not eat any food 4 hours prior to the test. . You may take your regular medications prior to the test.  . Take metoprolol (Lopressor) two hours prior to test. . FEMALES- please wear underwire-free bra if available       After the Test: . Drink plenty of water. . After receiving IV contrast, you may experience a mild flushed feeling. This is normal. . On occasion, you may experience a mild rash up to 24 hours after the test. This is not dangerous. If this occurs, you can take Benadryl 25 mg and increase your fluid intake. . If you experience trouble breathing, this can be serious. If it is severe call 911 IMMEDIATELY. If it is mild, please call our office.    Please contact the cardiac imaging nurse navigator should you have any questions/concerns Marchia Bond, RN Navigator Cardiac Imaging [facility] [facility] [phone] Office  [phone] Cell

## 2019-08-03 NOTE — Progress Notes (Signed)
Cardiology Office Note:    Date:  08/03/2019   ID:  [name], DOB [birth year], MRN [id number]  PCP:  Isaac Bliss, Rayford Halsted, MD  Cardiologist:  Shirlee More, MD   Referring MD: Isaac Bliss, [place]*  ASSESSMENT:    1. Chest pain in adult   2. Essential hypertension   3. Mixed hyperlipidemia    PLAN:    In order of problems listed above:  1. Chest pain in adult -elements of elements of both costochondral pain syndrome as well as atypical angina in view of her diet-controlled diabetes and hypertension further evaluation cardiac CTA.  If she has evidence of significant CAD flow-limiting stenosis high risk markers would benefit from angiography. 2. HTN -stable continue current treatment ARB 3. Type 2 diabetes stable diet controlled  Next appointment 6 weeks    Medication Adjustments/Labs and Tests Ordered: Current medicines are reviewed at length with the patient today.  Concerns regarding medicines are outlined above.  No orders of the defined types were placed in this encounter.  No orders of the defined types were placed in this encounter.    Chief Complaint  Patient presents with  . Follow-up    after ED visit with   . Chest Pain    History of Present Illness:    Katie Morse is a 53 y.o. female who is being seen today for the evaluation of chest pain at the request of Isaac Bliss, [place]*. Noted PMH HTN, DM2, HLD.   She was seen at [facility] ED for chest pain 06/04/2019.  EKG did not show ischemic changes troponin was normal.  She was admitted to the hospital serial cardiac enzymes were performed and found to be normal.  Echocardiogram showed normal left ventricular function.  She was recommended for outpatient ischemia evaluation.  Valve structure and function was normal and there is no evidence of pulmonary artery hypertension.  She was mildly anemic with a hemoglobin of 11.6 and microcytic indices.  Creatinine 0.51 GFR 126  cc/min potassium 3.9.  Lipid profile cholesterol 192 LDL 121 HDL 56, proBNP level was quite low normal at 12 and 3 troponin I assays were performed all nondetectable.  A chest x-ray was normal  She has a background of chest pain typically right more than left sharp momentary localized costochondral junctions.  She has a long history of mild exertional shortness of breath longer distance walking uphill but not at rest no orthopnea or edema.  She has no known history of heart disease.  When admitted to the hospital she describes severe diffuse precordial pressure shortness of breath and told me she was relieved with nitroglycerin.  She advised an ischemia evaluation discharge and has had no recurrent similar symptoms.  I reviewed the options of noninvasive either myocardial perfusion study or cardiac CTA and after discussion of benefits and options she elects to undergo cardiac CTA she has no dye allergy and is comfortable waiting 2 to 4 weeks.  I will see back in the office afterwards review findings with her.  On physical examination she has tenderness of the costochondral junction bilaterally. Past Medical History:  Diagnosis Date  . ALLERGIC RHINITIS 08/20/2008  . Allergy   . Anemia   . Blood transfusion without reported diagnosis 2011   post surgery  . Diabetes mellitus without complication ([place])   . DYSPNEA 11/12/2008  . Headache(784.0) 08/20/2008  . HYPERTENSION 08/20/2008  . LEIOMYOMA, UTERUS 08/20/2008  . PALPITATIONS, RECURRENT 11/12/2008  . Sickle-cell trait ([place]) 08/20/2008  .  SYSTOLIC MURMUR 99/83/3825    Past Surgical History:  Procedure Laterality Date  . LIPOMA EXCISION Left 07/19/2019   Procedure: EXCISION OF SUBCUTANEOUS LIPOMA LEFT BUTTOCK;  Surgeon: Donnie Mesa, MD;  Location: [facility];  Service: General;  Laterality: Left;  . MYOMECTOMY     fibroids    Current Medications: Current Meds  Medication Sig  . aspirin EC 81 MG tablet Take 81 mg by mouth  daily.  . Cranberry 1000 MG CAPS Take 1 capsule by mouth daily.   . Diclofenac Sodium 1 % CREA Apply a small amount to affected area once daily  . HYDROcodone-acetaminophen (NORCO/VICODIN) 5-325 MG tablet Take 1 tablet by mouth every 6 (six) hours as needed.  . hydrocortisone 2.5 % cream Apply topically 2 (two) times daily.  Marland Kitchen losartan (COZAAR) 100 MG tablet Take 1 tablet (100 mg total) by mouth daily.  . Multiple Vitamins-Minerals (HAIR/SKIN/NAILS PO) Take 2 each by mouth daily.  . nitroGLYCERIN (NITROSTAT) 0.4 MG SL tablet Place 0.4 mg under the tongue every 5 (five) minutes as needed for chest pain.  . Prenatal Vit-Fe Fumarate-FA (PRENATAL MULTIVITAMIN) TABS Take 1 tablet by mouth daily at 12 noon.  . vitamin B-12 (CYANOCOBALAMIN) 100 MCG tablet Take 50 mcg by mouth daily.  . vitamin C (ASCORBIC ACID) 500 MG tablet Take 500 mg by mouth daily.  . vitamin E 100 UNIT capsule Take 100 Units by mouth daily.     Allergies:   Latex   Social History   Socioeconomic History  . Marital status: Married    Spouse name: Not on file  . Number of children: Not on file  . Years of education: Not on file  . Highest education level: Not on file  Occupational History  . Not on file  Social Needs  . Financial resource strain: Not on file  . Food insecurity    Worry: Not on file    Inability: Not on file  . Transportation needs    Medical: Not on file    Non-medical: Not on file  Tobacco Use  . Smoking status: Never Smoker  . Smokeless tobacco: Never Used  Substance and Sexual Activity  . Alcohol use: Yes    Alcohol/week: 0.0 standard drinks    Comment: rarely  . Drug use: No  . Sexual activity: Not on file  Lifestyle  . Physical activity    Days per week: Not on file    Minutes per session: Not on file  . Stress: Not on file  Relationships  . Social Herbalist on phone: Not on file    Gets together: Not on file    Attends religious service: Not on file    Active member  of club or organization: Not on file    Attends meetings of clubs or organizations: Not on file    Relationship status: Not on file  Other Topics Concern  . Not on file  Social History Narrative  . Not on file     Family History: The patient's family history includes Breast cancer in her cousin; Cancer in her father; Diabetes in an other family member; Heart Problems in her mother; Hypertension in her mother. There is no history of Colon cancer.  ROS:   Review of Systems  Constitution: Negative.  HENT: Negative.   Eyes: Negative.   Cardiovascular: Positive for chest pain and dyspnea on exertion.  Respiratory: Positive for shortness of breath.   Endocrine: Negative.   Hematologic/Lymphatic:  Negative.   Skin: Negative.   Musculoskeletal: Positive for joint pain.  Gastrointestinal: Negative.   Genitourinary: Negative.   Neurological: Negative.   Psychiatric/Behavioral: Negative.   Allergic/Immunologic: Negative.    Please see the history of present illness.     All other systems reviewed and are negative.  EKGs/Labs/Other Studies Reviewed:    The following studies were reviewed today:   EKG:  EKG is  ordered today.  The ekg ordered today is personally reviewed and demonstrates sinus rhythm normal  Recent Labs: No results found for requested labs within last 8760 hours.  Recent Lipid Panel    Component Value Date/Time   CHOL 203 (H) 02/05/2017 1008   TRIG 62.0 02/05/2017 1008   HDL 67.60 02/05/2017 1008   CHOLHDL 3 02/05/2017 1008   VLDL 12.4 02/05/2017 1008   LDLCALC 123 (H) 02/05/2017 1008   LDLDIRECT 135.0 05/11/2012 0909    Physical Exam:    VS:  BP (!) 136/94 (BP Location: Left Arm, Patient Position: Sitting, Cuff Size: Large)   Pulse 75   Ht 5\' 2"  (1.575 m)   Wt 194 lb 1.9 oz (88.1 kg)   LMP 07/04/2014 Comment: GYN  SpO2 96%   BMI 35.51 kg/m     Wt Readings from Last 3 Encounters:  08/03/19 194 lb 1.9 oz (88.1 kg)  07/19/19 194 lb 14.2 oz (88.4 kg)   02/17/19 195 lb 11.2 oz (88.8 kg)     GEN:  Well nourished, well developed in no acute distress HEENT: Normal NECK: No JVD; No carotid bruits LYMPHATICS: No lymphadenopathy CARDIAC: tender CCJ bilaterally RRR, no murmurs, rubs, gallops RESPIRATORY:  Clear to auscultation without rales, wheezing or rhonchi  ABDOMEN: Soft, non-tender, non-distended MUSCULOSKELETAL:  No edema; No deformity  SKIN: Warm and dry NEUROLOGIC:  Alert and oriented x 3 PSYCHIATRIC:  Normal affect     Signed, Shirlee More, MD  08/03/2019 1:46 PM    [facility]

## 2019-08-15 LAB — BASIC METABOLIC PANEL
BUN/Creatinine Ratio: 15 (ref 9–23)
BUN: 9 mg/dL (ref 6–24)
CO2: 22 mmol/L (ref 20–29)
Calcium: 9.4 mg/dL (ref 8.7–10.2)
Chloride: 105 mmol/L (ref 96–106)
Creatinine, Ser: 0.59 mg/dL (ref 0.57–1.00)
GFR calc Af Amer: 120 mL/min/{1.73_m2} (ref >59)
GFR calc non Af Amer: 104 mL/min/{1.73_m2} (ref >59)
Glucose: 132 mg/dL — ABNORMAL HIGH (ref 65–99)
Potassium: 4 mmol/L (ref 3.5–5.2)
Sodium: 141 mmol/L (ref 134–144)

## 2019-08-17 ENCOUNTER — Telehealth (HOSPITAL_COMMUNITY): Payer: Self-pay | Admitting: Emergency Medicine

## 2019-08-17 NOTE — Telephone Encounter (Signed)
Reaching out to patient to offer assistance regarding upcoming cardiac imaging study; pt verbalizes understanding of appt date/time, parking situation and where to check in, pre-test NPO status and medications ordered, and verified current allergies; name and call back number provided for further questions should they arise Khalon Cansler RN Navigator Cardiac Imaging [facility] [phone] office [phone] cell 

## 2019-08-18 ENCOUNTER — Ambulatory Visit (HOSPITAL_COMMUNITY)
Admission: RE | Admit: 2019-08-18 | Discharge: 2019-08-18 | Disposition: A | Discharge status: Home / Self Care | Payer: BC Managed Care – PPO | Source: Ambulatory Visit | Attending: Cardiology | Admitting: Cardiology

## 2019-08-18 ENCOUNTER — Other Ambulatory Visit: Payer: Self-pay

## 2019-08-18 ENCOUNTER — Ambulatory Visit (HOSPITAL_COMMUNITY): Payer: BC Managed Care – PPO

## 2019-08-18 DIAGNOSIS — R079 Chest pain, unspecified: Secondary | ICD-10-CM

## 2019-08-18 MED ORDER — NITROGLYCERIN 0.4 MG SL SUBL
0.8000 mg | SUBLINGUAL_TABLET | Freq: Once | SUBLINGUAL | Status: AC
  Administered 2019-08-18: 16:00:00 0.8 mg via SUBLINGUAL

## 2019-08-18 MED ORDER — IOHEXOL 350 MG/ML SOLN
80.0000 mL | Freq: Once | INTRAVENOUS | Status: AC | PRN
  Administered 2019-08-18: 80 mL via INTRAVENOUS

## 2019-08-18 MED ORDER — NITROGLYCERIN 0.4 MG SL SUBL
SUBLINGUAL_TABLET | SUBLINGUAL | Status: AC
  Filled 2019-08-18: qty 2

## 2019-08-18 NOTE — Progress Notes (Signed)
Ct complete. Patient denies any complaints. Offered patient snack and beverage.

## 2019-08-23 ENCOUNTER — Encounter: Payer: BLUE CROSS/BLUE SHIELD | Admitting: Internal Medicine

## 2019-09-16 NOTE — Progress Notes (Signed)
Cardiology Office Note:    Date:  09/18/2019   ID:  Katie Morse, DOB 05/30/1966, MRN [id number]  PCP:  Isaac Bliss, Rayford Halsted, MD  Cardiologist:  Shirlee More, MD    Referring MD: Isaac Bliss, [place]*    ASSESSMENT:    1. Chest pain in adult   2. Coronary-myocardial bridge   3. Essential hypertension   4. Controlled type 2 diabetes mellitus without complication, without long-term current use of insulin (HCC)    PLAN:    In order of problems listed above:  1. Most consistent with costochondral pain syndrome I told her in the future to use ibuprofen or Aleve over-the-counter as needed.  The findings a cardiac CTA I do not think she requires coronary angiography 2. Typically a curiosity has myocardial blood flow is diastolic and coronary compression is a systolic phenomenon.  In people with hypertrophic cardiomyopathy is a can be problematic.  Her symptoms are not typical angina at this time I would not perform further cardiac evaluation. 3. Continue ARB if additional therapy is needed beta-blocker would be appropriate 4. Stable   Next appointment: As needed   Medication Adjustments/Labs and Tests Ordered: Current medicines are reviewed at length with the patient today.  Concerns regarding medicines are outlined above.  No orders of the defined types were placed in this encounter.  No orders of the defined types were placed in this encounter.   Chief Complaint  Patient presents with   Follow-up    after CTA    History of Present Illness:    Katie Morse is a 53 y.o. female with a hx of chest pain last seen 08/03/2019 and cardiology consultation.  Her chest pain has elements of both chronic costochondral pain syndrome as well as atypical angina in view of her diet-controlled diabetes and hypertension further evaluation was recommended with cardiac CTA.  Her calcium score was 0 who is known coronary artery disease but she did have a  myocardial bridge noted in the mid left anterior descending coronary artery. Compliance with diet, lifestyle and medications: Yes  When opportunity sit down reviewed her cardiac CTA calcium score is 0 she had no CAD and she had a focal area of myocardial bridge which is generally a curiosity.  She is not having typical angina but finds that after she runs all day she has a vague tightness in her chest and is used nitroglycerin twice with relief.  She has chest wall tenderness along the costochondral junction I told her in the future is to use ibuprofen or Aleve.  Do not think she is having typical angina and would not advise referral for coronary angiography.  She is reassured. Past Medical History:  Diagnosis Date   ALLERGIC RHINITIS 08/20/2008   Allergy    Anemia    Blood transfusion without reported diagnosis 2011   post surgery   Diabetes mellitus without complication ([place])    DYSPNEA 11/12/2008   Headache(784.0) 08/20/2008   HYPERTENSION 08/20/2008   LEIOMYOMA, UTERUS 08/20/2008   PALPITATIONS, RECURRENT 11/12/2008   Sickle-cell trait ([place]) [id number]   SYSTOLIC MURMUR [id number]    Past Surgical History:  Procedure Laterality Date   LIPOMA EXCISION Left 07/19/2019   Procedure: EXCISION OF SUBCUTANEOUS LIPOMA LEFT BUTTOCK;  Surgeon: Donnie Mesa, MD;  Location: [place];  Service: General;  Laterality: Left;   MYOMECTOMY     fibroids    Current Medications: Current Meds  Medication Sig   aspirin EC 81 MG tablet  Take 81 mg by mouth daily.   Cranberry 1000 MG CAPS Take 1 capsule by mouth daily.    Diclofenac Sodium 1 % CREA Apply a small amount to affected area once daily   HYDROcodone-acetaminophen (NORCO/VICODIN) 5-325 MG tablet Take 1 tablet by mouth every 6 (six) hours as needed.   hydrocortisone 2.5 % cream Apply topically 2 (two) times daily.   losartan (COZAAR) 100 MG tablet Take 1 tablet (100 mg total) by mouth daily.   Multiple  Vitamins-Minerals (HAIR/SKIN/NAILS PO) Take 2 each by mouth daily.   nitroGLYCERIN (NITROSTAT) 0.4 MG SL tablet Place 0.4 mg under the tongue every 5 (five) minutes as needed for chest pain.   Prenatal Vit-Fe Fumarate-FA (PRENATAL MULTIVITAMIN) TABS Take 1 tablet by mouth daily at 12 noon.   vitamin B-12 (CYANOCOBALAMIN) 100 MCG tablet Take 50 mcg by mouth daily.   vitamin C (ASCORBIC ACID) 500 MG tablet Take 500 mg by mouth daily.   vitamin E 100 UNIT capsule Take 100 Units by mouth daily.     Allergies:   Latex   Social History   Socioeconomic History   Marital status: Married    Spouse name: Not on file   Number of children: Not on file   Years of education: Not on file   Highest education level: Not on file  Occupational History   Not on file  Social Needs   Financial resource strain: Not on file   Food insecurity    Worry: Not on file    Inability: Not on file   Transportation needs    Medical: Not on file    Non-medical: Not on file  Tobacco Use   Smoking status: Never Smoker   Smokeless tobacco: Never Used  Substance and Sexual Activity   Alcohol use: Yes    Alcohol/week: 0.0 standard drinks    Comment: rarely   Drug use: No   Sexual activity: Not on file  Lifestyle   Physical activity    Days per week: Not on file    Minutes per session: Not on file   Stress: Not on file  Relationships   Social connections    Talks on phone: Not on file    Gets together: Not on file    Attends religious service: Not on file    Active member of club or organization: Not on file    Attends meetings of clubs or organizations: Not on file    Relationship status: Not on file  Other Topics Concern   Not on file  Social History Narrative   Not on file     Family History: The patient's family history includes Breast cancer in her cousin; Cancer in her father; Diabetes in an other family member; Heart Problems in her mother; Hypertension in her mother.  There is no history of Colon cancer. ROS:   Please see the history of present illness.    All other systems reviewed and are negative.  EKGs/Labs/Other Studies Reviewed:    The following studies were reviewed today:  Recent Labs: 08/14/2019: BUN 9; Creatinine, Ser 0.59; Potassium 4.0; Sodium 141  Recent Lipid Panel    Component Value Date/Time   CHOL 203 (H) 02/05/2017 1008   TRIG 62.0 02/05/2017 1008   HDL 67.60 02/05/2017 1008   CHOLHDL 3 02/05/2017 1008   VLDL 12.4 02/05/2017 1008   LDLCALC 123 (H) 02/05/2017 1008   LDLDIRECT 135.0 05/11/2012 0909    Physical Exam:    VS:  BP Marland Kitchen)  170/104 (BP Location: Right Arm, Patient Position: Sitting, Cuff Size: Normal)    Pulse 70    Temp (!) 97.2 F (36.2 C)    Ht 5\' 2"  (1.575 m)    Wt 194 lb 12.8 oz (88.4 kg)    LMP 07/04/2014 Comment: GYN   SpO2 96%    BMI 35.63 kg/m     Wt Readings from Last 3 Encounters:  09/18/19 194 lb 12.8 oz (88.4 kg)  08/03/19 194 lb 1.9 oz (88.1 kg)  07/19/19 194 lb 14.2 oz (88.4 kg)     GEN:  Well nourished, well developed in no acute distress HEENT: Normal NECK: No JVD; No carotid bruits LYMPHATICS: No lymphadenopathy CARDIAC: RRR, no murmurs, rubs, gallops RESPIRATORY:  Clear to auscultation without rales, wheezing or rhonchi  ABDOMEN: Soft, non-tender, non-distended MUSCULOSKELETAL:  No edema; No deformity  SKIN: Warm and dry NEUROLOGIC:  Alert and oriented x 3 PSYCHIATRIC:  Normal affect    Signed, Shirlee More, MD  09/18/2019 2:36 PM    [facility]

## 2019-09-18 ENCOUNTER — Encounter: Payer: Self-pay | Admitting: Cardiology

## 2019-09-18 ENCOUNTER — Ambulatory Visit (INDEPENDENT_AMBULATORY_CARE_PROVIDER_SITE_OTHER): Payer: BC Managed Care – PPO | Admitting: Cardiology

## 2019-09-18 ENCOUNTER — Other Ambulatory Visit: Payer: Self-pay

## 2019-09-18 VITALS — BP 170/104 | HR 70 | Temp 97.2°F | Ht 62.0 in | Wt 194.8 lb

## 2019-09-18 DIAGNOSIS — I10 Essential (primary) hypertension: Secondary | ICD-10-CM | POA: Diagnosis not present

## 2019-09-18 DIAGNOSIS — Q245 Malformation of coronary vessels: Secondary | ICD-10-CM | POA: Diagnosis not present

## 2019-09-18 DIAGNOSIS — E119 Type 2 diabetes mellitus without complications: Secondary | ICD-10-CM

## 2019-09-18 DIAGNOSIS — R079 Chest pain, unspecified: Secondary | ICD-10-CM

## 2019-09-18 NOTE — Patient Instructions (Signed)
Medication Instructions:  Your physician has recommended you make the following change in your medication:   START over the counter aleve or ibuprofen as needed for chest pain  If you need a refill on your cardiac medications before your next appointment, please call your pharmacy.   Lab work: None  If you have labs (blood work) drawn today and your tests are completely normal, you will receive your results only by: Marland Kitchen MyChart Message (if you have MyChart) OR . A paper copy in the mail If you have any lab test that is abnormal or we need to change your treatment, we will call you to review the results.  Testing/Procedures: None  Follow-Up: At [facility], you and your health needs are our priority.  As part of our continuing mission to provide you with exceptional heart care, we have created designated Provider Care Teams.  These Care Teams include your primary Cardiologist (physician) and Advanced Practice Providers (APPs -  Physician Assistants and Nurse Practitioners) who all work together to provide you with the care you need, when you need it. You will need a follow up appointment as needed if symptoms worsen or fail to improve.

## 2019-09-22 ENCOUNTER — Encounter: Payer: Self-pay | Admitting: Internal Medicine

## 2019-09-22 ENCOUNTER — Ambulatory Visit (INDEPENDENT_AMBULATORY_CARE_PROVIDER_SITE_OTHER): Payer: BC Managed Care – PPO | Admitting: Internal Medicine

## 2019-09-22 ENCOUNTER — Other Ambulatory Visit: Payer: Self-pay

## 2019-09-22 ENCOUNTER — Other Ambulatory Visit: Payer: Self-pay | Admitting: Internal Medicine

## 2019-09-22 VITALS — BP 150/100 | HR 71 | Temp 98.0°F | Ht 63.0 in | Wt 196.0 lb

## 2019-09-22 DIAGNOSIS — I10 Essential (primary) hypertension: Secondary | ICD-10-CM | POA: Diagnosis not present

## 2019-09-22 DIAGNOSIS — Z0000 Encounter for general adult medical examination without abnormal findings: Secondary | ICD-10-CM | POA: Diagnosis not present

## 2019-09-22 DIAGNOSIS — E669 Obesity, unspecified: Secondary | ICD-10-CM | POA: Diagnosis not present

## 2019-09-22 DIAGNOSIS — E559 Vitamin D deficiency, unspecified: Secondary | ICD-10-CM | POA: Insufficient documentation

## 2019-09-22 DIAGNOSIS — E1169 Type 2 diabetes mellitus with other specified complication: Secondary | ICD-10-CM | POA: Diagnosis not present

## 2019-09-22 DIAGNOSIS — E119 Type 2 diabetes mellitus without complications: Secondary | ICD-10-CM

## 2019-09-22 DIAGNOSIS — E66811 Obesity, class 1: Secondary | ICD-10-CM

## 2019-09-22 DIAGNOSIS — Z23 Encounter for immunization: Secondary | ICD-10-CM

## 2019-09-22 DIAGNOSIS — R079 Chest pain, unspecified: Secondary | ICD-10-CM

## 2019-09-22 DIAGNOSIS — E785 Hyperlipidemia, unspecified: Secondary | ICD-10-CM | POA: Diagnosis not present

## 2019-09-22 LAB — TSH: TSH: 0.59 u[IU]/mL (ref 0.35–4.50)

## 2019-09-22 LAB — COMPREHENSIVE METABOLIC PANEL
ALT: 14 U/L (ref 0–35)
AST: 13 U/L (ref 0–37)
Albumin: 4.3 g/dL (ref 3.5–5.2)
Alkaline Phosphatase: 79 U/L (ref 39–117)
BUN: 12 mg/dL (ref 6–23)
CO2: 28 mEq/L (ref 19–32)
Calcium: 9.9 mg/dL (ref 8.4–10.5)
Chloride: 107 mEq/L (ref 96–112)
Creatinine, Ser: 0.58 mg/dL (ref 0.40–1.20)
GFR: 130.89 mL/min (ref >60.00)
Glucose, Bld: 114 mg/dL — ABNORMAL HIGH (ref 70–99)
Potassium: 3.9 mEq/L (ref 3.5–5.1)
Sodium: 144 mEq/L (ref 135–145)
Total Bilirubin: 0.5 mg/dL (ref 0.2–1.2)
Total Protein: 7.6 g/dL (ref 6.0–8.3)

## 2019-09-22 LAB — CBC WITH DIFFERENTIAL/PLATELET
Basophils Absolute: 0 10*3/uL (ref 0.0–0.1)
Basophils Relative: 1 % (ref 0.0–3.0)
Eosinophils Absolute: 0.1 10*3/uL (ref 0.0–0.7)
Eosinophils Relative: 3.2 % (ref 0.0–5.0)
HCT: 36.7 % (ref 36.0–46.0)
Hemoglobin: 12.2 g/dL (ref 12.0–15.0)
Lymphocytes Relative: 37 % (ref 12.0–46.0)
Lymphs Abs: 1.6 10*3/uL (ref 0.7–4.0)
MCHC: 33.1 g/dL (ref 30.0–36.0)
MCV: 81.6 fl (ref 78.0–100.0)
Monocytes Absolute: 0.3 10*3/uL (ref 0.1–1.0)
Monocytes Relative: 6.2 % (ref 3.0–12.0)
Neutro Abs: 2.2 10*3/uL (ref 1.4–7.7)
Neutrophils Relative %: 52.6 % (ref 43.0–77.0)
Platelets: 279 10*3/uL (ref 150.0–400.0)
RBC: 4.49 Mil/uL (ref 3.87–5.11)
RDW: 14.2 % (ref 11.5–15.5)
WBC: 4.2 10*3/uL (ref 4.0–10.5)

## 2019-09-22 LAB — HEMOGLOBIN A1C: Hgb A1c MFr Bld: 7.2 % — ABNORMAL HIGH (ref 4.6–6.5)

## 2019-09-22 LAB — LIPID PANEL
Cholesterol: 189 mg/dL (ref 0–200)
HDL: 61.3 mg/dL (ref >39.00)
LDL Cholesterol: 112 mg/dL — ABNORMAL HIGH (ref 0–99)
NonHDL: 128.18
Total CHOL/HDL Ratio: 3
Triglycerides: 81 mg/dL (ref 0.0–149.0)
VLDL: 16.2 mg/dL (ref 0.0–40.0)

## 2019-09-22 LAB — VITAMIN D 25 HYDROXY (VIT D DEFICIENCY, FRACTURES): VITD: 27.8 ng/mL — ABNORMAL LOW (ref 30.00–100.00)

## 2019-09-22 LAB — VITAMIN B12: Vitamin B-12: 245 pg/mL (ref 211–911)

## 2019-09-22 MED ORDER — METFORMIN HCL 500 MG PO TABS: 500.0000 mg | ORAL_TABLET | Freq: Two times a day (BID) | ORAL | 1 refills | Status: DC

## 2019-09-22 MED ORDER — VITAMIN D (ERGOCALCIFEROL) 1.25 MG (50000 UNIT) PO CAPS: 50000.0000 [IU] | ORAL_CAPSULE | ORAL | 0 refills | Status: AC

## 2019-09-22 NOTE — Patient Instructions (Signed)
-Nice seeing you today!!  -Lab work today; will notify you once results are available.  -Schedule dental appointment.  -First shingles vaccine today.  -START metoprolol 25 mg twice daily for your blood pressure.  -Low salt diet (see below).  -Schedule follow up in 6 weeks.   Preventive Care 67-54 Years Old, Female Preventive care refers to visits with your health care provider and lifestyle choices that can promote health and wellness. This includes:  A yearly physical exam. This may also be called an annual well check.  Regular dental visits and eye exams.  Immunizations.  Screening for certain conditions.  Healthy lifestyle choices, such as eating a healthy diet, getting regular exercise, not using drugs or products that contain nicotine and tobacco, and limiting alcohol use. What can I expect for my preventive care visit? Physical exam Your health care provider will check your:  Height and weight. This may be used to calculate body mass index (BMI), which tells if you are at a healthy weight.  Heart rate and blood pressure.  Skin for abnormal spots. Counseling Your health care provider may ask you questions about your:  Alcohol, tobacco, and drug use.  Emotional well-being.  Home and relationship well-being.  Sexual activity.  Eating habits.  Work and work Statistician.  Method of birth control.  Menstrual cycle.  Pregnancy history. What immunizations do I need?  Influenza (flu) vaccine  This is recommended every year. Tetanus, diphtheria, and pertussis (Tdap) vaccine  You may need a Td booster every 10 years. Varicella (chickenpox) vaccine  You may need this if you have not been vaccinated. Zoster (shingles) vaccine  You may need this after age 79. Measles, mumps, and rubella (MMR) vaccine  You may need at least one dose of MMR if you were born in 1957 or later. You may also need a second dose. Pneumococcal conjugate (PCV13) vaccine  You  may need this if you have certain conditions and were not previously vaccinated. Pneumococcal polysaccharide (PPSV23) vaccine  You may need one or two doses if you smoke cigarettes or if you have certain conditions. Meningococcal conjugate (MenACWY) vaccine  You may need this if you have certain conditions. Hepatitis A vaccine  You may need this if you have certain conditions or if you travel or work in places where you may be exposed to hepatitis A. Hepatitis B vaccine  You may need this if you have certain conditions or if you travel or work in places where you may be exposed to hepatitis B. Haemophilus influenzae type b (Hib) vaccine  You may need this if you have certain conditions. Human papillomavirus (HPV) vaccine  If recommended by your health care provider, you may need three doses over 6 months. You may receive vaccines as individual doses or as more than one vaccine together in one shot (combination vaccines). Talk with your health care provider about the risks and benefits of combination vaccines. What tests do I need? Blood tests  Lipid and cholesterol levels. These may be checked every 5 years, or more frequently if you are over 52 years old.  Hepatitis C test.  Hepatitis B test. Screening  Lung cancer screening. You may have this screening every year starting at age 73 if you have a 30-pack-year history of smoking and currently smoke or have quit within the past 15 years.  Colorectal cancer screening. All adults should have this screening starting at age 38 and continuing until age 76. Your health care provider may recommend screening at  age 28 if you are at increased risk. You will have tests every 1-10 years, depending on your results and the type of screening test.  Diabetes screening. This is done by checking your blood sugar (glucose) after you have not eaten for a while (fasting). You may have this done every 1-3 years.  Mammogram. This may be done every 1-2  years. Talk with your health care provider about when you should start having regular mammograms. This may depend on whether you have a family history of breast cancer.  BRCA-related cancer screening. This may be done if you have a family history of breast, ovarian, tubal, or peritoneal cancers.  Pelvic exam and Pap test. This may be done every 3 years starting at age 53. Starting at age 67, this may be done every 5 years if you have a Pap test in combination with an HPV test. Other tests  Sexually transmitted disease (STD) testing.  Bone density scan. This is done to screen for osteoporosis. You may have this scan if you are at high risk for osteoporosis. Follow these instructions at home: Eating and drinking  Eat a diet that includes fresh fruits and vegetables, whole grains, lean protein, and low-fat dairy.  Take vitamin and mineral supplements as recommended by your health care provider.  Do not drink alcohol if: ? Your health care provider tells you not to drink. ? You are pregnant, may be pregnant, or are planning to become pregnant.  If you drink alcohol: ? Limit how much you have to 0-1 drink a day. ? Be aware of how much alcohol is in your drink. In the [place] one drink equals one 12 oz bottle of beer (355 mL), one 5 oz glass of wine (148 mL), or one 1 oz glass of hard liquor (44 mL). Lifestyle  Take daily care of your teeth and gums.  Stay active. Exercise for at least 30 minutes on 5 or more days each week.  Do not use any products that contain nicotine or tobacco, such as cigarettes, e-cigarettes, and chewing tobacco. If you need help quitting, ask your health care provider.  If you are sexually active, practice safe sex. Use a condom or other form of birth control (contraception) in order to prevent pregnancy and STIs (sexually transmitted infections).  If told by your health care provider, take low-dose aspirin daily starting at age 54. What's next?  Visit your  health care provider once a year for a well check visit.  Ask your health care provider how often you should have your eyes and teeth checked.  Stay up to date on all vaccines. This information is not intended to replace advice given to you by your health care provider. Make sure you discuss any questions you have with your health care provider. Document Released: 01/03/2016 Document Revised: 08/18/2018 Document Reviewed: 08/18/2018 Elsevier Patient Education  2020 [place] DASH stands for "Dietary Approaches to Stop Hypertension." The DASH eating plan is a healthy eating plan that has been shown to reduce high blood pressure (hypertension). It may also reduce your risk for type 2 diabetes, heart disease, and stroke. The DASH eating plan may also help with weight loss. What are tips for following this plan?  General guidelines  Avoid eating more than 2,300 mg (milligrams) of salt (sodium) a day. If you have hypertension, you may need to reduce your sodium intake to 1,500 mg a day.  Limit alcohol intake to no more than 1  drink a day for nonpregnant women and 2 drinks a day for men. One drink equals 12 oz of beer, 5 oz of wine, or 1 oz of hard liquor.  Work with your health care provider to maintain a healthy body weight or to lose weight. Ask what an ideal weight is for you.  Get at least 30 minutes of exercise that causes your heart to beat faster (aerobic exercise) most days of the week. Activities may include walking, swimming, or biking.  Work with your health care provider or diet and nutrition specialist (dietitian) to adjust your eating plan to your individual calorie needs. Reading food labels   Check food labels for the amount of sodium per serving. Choose foods with less than 5 percent of the Daily Value of sodium. Generally, foods with less than 300 mg of sodium per serving fit into this eating plan.  To find whole grains, look for the word "whole" as  the first word in the ingredient list. Shopping  Buy products labeled as "low-sodium" or "no salt added."  Buy fresh foods. Avoid canned foods and premade or frozen meals. Cooking  Avoid adding salt when cooking. Use salt-free seasonings or herbs instead of table salt or sea salt. Check with your health care provider or pharmacist before using salt substitutes.  Do not fry foods. Cook foods using healthy methods such as baking, boiling, grilling, and broiling instead.  Cook with heart-healthy oils, such as olive, canola, soybean, or sunflower oil. Meal planning  Eat a balanced diet that includes: ? 5 or more servings of fruits and vegetables each day. At each meal, try to fill half of your plate with fruits and vegetables. ? Up to 6-8 servings of whole grains each day. ? Less than 6 oz of lean meat, poultry, or fish each day. A 3-oz serving of meat is about the same size as a deck of cards. One egg equals 1 oz. ? 2 servings of low-fat dairy each day. ? A serving of nuts, seeds, or beans 5 times each week. ? Heart-healthy fats. Healthy fats called Omega-3 fatty acids are found in foods such as flaxseeds and coldwater fish, like sardines, salmon, and mackerel.  Limit how much you eat of the following: ? Canned or prepackaged foods. ? Food that is high in trans fat, such as fried foods. ? Food that is high in saturated fat, such as fatty meat. ? Sweets, desserts, sugary drinks, and other foods with added sugar. ? Full-fat dairy products.  Do not salt foods before eating.  Try to eat at least 2 vegetarian meals each week.  Eat more home-cooked food and less restaurant, buffet, and fast food.  When eating at a restaurant, ask that your food be prepared with less salt or no salt, if possible. What foods are recommended? The items listed may not be a complete list. Talk with your dietitian about what dietary choices are best for you. Grains Whole-grain or whole-wheat bread.  Whole-grain or whole-wheat pasta. Brown rice. [place]. Bulgur. Whole-grain and low-sodium cereals. Pita bread. Low-fat, low-sodium crackers. Whole-wheat flour tortillas. Vegetables Fresh or frozen vegetables (raw, steamed, roasted, or grilled). Low-sodium or reduced-sodium tomato and vegetable juice. Low-sodium or reduced-sodium tomato sauce and tomato paste. Low-sodium or reduced-sodium canned vegetables. Fruits All fresh, dried, or frozen fruit. Canned fruit in natural juice (without added sugar). Meat and other protein foods Skinless chicken or [place]. Ground chicken or [place]. Pork with fat trimmed off. Fish and seafood. Egg whites. Dried  beans, peas, or lentils. Unsalted nuts, nut butters, and seeds. Unsalted canned beans. Lean cuts of beef with fat trimmed off. Low-sodium, lean deli meat. Dairy Low-fat (1%) or fat-free (skim) milk. Fat-free, low-fat, or reduced-fat cheeses. Nonfat, low-sodium ricotta or cottage cheese. Low-fat or nonfat yogurt. Low-fat, low-sodium cheese. Fats and oils Soft margarine without trans fats. Vegetable oil. Low-fat, reduced-fat, or light mayonnaise and salad dressings (reduced-sodium). Canola, safflower, olive, soybean, and sunflower oils. Avocado. Seasoning and other foods Herbs. Spices. Seasoning mixes without salt. Unsalted popcorn and pretzels. Fat-free sweets. What foods are not recommended? The items listed may not be a complete list. Talk with your dietitian about what dietary choices are best for you. Grains Baked goods made with fat, such as croissants, muffins, or some breads. Dry pasta or rice meal packs. Vegetables Creamed or fried vegetables. Vegetables in a cheese sauce. Regular canned vegetables (not low-sodium or reduced-sodium). Regular canned tomato sauce and paste (not low-sodium or reduced-sodium). Regular tomato and vegetable juice (not low-sodium or reduced-sodium). Angie Fava. Olives. Fruits Canned fruit in a light or heavy syrup.  Fried fruit. Fruit in cream or butter sauce. Meat and other protein foods Fatty cuts of meat. Ribs. Fried meat. Berniece Salines. Sausage. Bologna and other processed lunch meats. Salami. Fatback. Hotdogs. Bratwurst. Salted nuts and seeds. Canned beans with added salt. Canned or smoked fish. Whole eggs or egg yolks. Chicken or [place] with skin. Dairy Whole or 2% milk, cream, and half-and-half. Whole or full-fat cream cheese. Whole-fat or sweetened yogurt. Full-fat cheese. Nondairy creamers. Whipped toppings. Processed cheese and cheese spreads. Fats and oils Butter. Stick margarine. Lard. Shortening. Ghee. Bacon fat. Tropical oils, such as coconut, palm kernel, or palm oil. Seasoning and other foods Salted popcorn and pretzels. Onion salt, garlic salt, seasoned salt, table salt, and sea salt. Worcestershire sauce. Tartar sauce. Barbecue sauce. Teriyaki sauce. Soy sauce, including reduced-sodium. Steak sauce. Canned and packaged gravies. Fish sauce. Oyster sauce. Cocktail sauce. Horseradish that you find on the shelf. Ketchup. Mustard. Meat flavorings and tenderizers. Bouillon cubes. Hot sauce and Tabasco sauce. Premade or packaged marinades. Premade or packaged taco seasonings. Relishes. Regular salad dressings. Where to find more information:  National Heart, Lung, and [place]: https://wilson-eaton.com/  American Heart Association: www.heart.org Summary  The DASH eating plan is a healthy eating plan that has been shown to reduce high blood pressure (hypertension). It may also reduce your risk for type 2 diabetes, heart disease, and stroke.  With the DASH eating plan, you should limit salt (sodium) intake to 2,300 mg a day. If you have hypertension, you may need to reduce your sodium intake to 1,500 mg a day.  When on the DASH eating plan, aim to eat more fresh fruits and vegetables, whole grains, lean proteins, low-fat dairy, and heart-healthy fats.  Work with your health care provider or diet and  nutrition specialist (dietitian) to adjust your eating plan to your individual calorie needs. This information is not intended to replace advice given to you by your health care provider. Make sure you discuss any questions you have with your health care provider. Document Released: 11/26/2011 Document Revised: 11/19/2017 Document Reviewed: 11/30/2016 Elsevier Patient Education  2020 [employer].

## 2019-09-22 NOTE — Addendum Note (Signed)
Addended by: Westley Hummer B on: 09/22/2019 05:29 PM   Modules accepted: Orders

## 2019-09-22 NOTE — Progress Notes (Signed)
Established Patient Office Visit     CC/Reason for Visit: Annual preventive exam  HPI: Katie Morse is a 53 y.o. female who is coming in today for the above mentioned reasons. Past Medical History is significant for: Uncontrolled hypertension, hyperlipidemia with an LDL of 124 in February 2018, well-controlled diabetes.  Since I last saw her she did visit the emergency department for chest pain.  Was subsequently referred to cardiology who did a coronary CT with a score of 0 and decided that cath was not necessary.  His notes have been reviewed in detail and it appears that he is recommending treating blood pressure and PRN NSAIDs for possible costochondritis.  She has no complaints today.  She has noted for the past 3 to 4 weeks that her blood pressure has been elevated in the 1 62-8 80 systolic range with an average diastolic blood pressure around 100.  She has routine eye care but not dental care.  She recently received the flu vaccine at [facility] office, she is interested in receiving shingles today.   Past Medical/Surgical History: Past Medical History:  Diagnosis Date   ALLERGIC RHINITIS 08/20/2008   Allergy    Anemia    Blood transfusion without reported diagnosis 2011   post surgery   Diabetes mellitus without complication ([place])    DYSPNEA 11/12/2008   Headache(784.0) 08/20/2008   HYPERTENSION 08/20/2008   LEIOMYOMA, UTERUS 08/20/2008   PALPITATIONS, RECURRENT 11/12/2008   Sickle-cell trait ([place]) 3/66/2947   SYSTOLIC MURMUR 65/46/5035    Past Surgical History:  Procedure Laterality Date   LIPOMA EXCISION Left 07/19/2019   Procedure: EXCISION OF SUBCUTANEOUS LIPOMA LEFT BUTTOCK;  Surgeon: Donnie Mesa, MD;  Location: [facility];  Service: General;  Laterality: Left;   MYOMECTOMY     fibroids    Social History:  reports that she has never smoked. She has never used smokeless tobacco. She reports current alcohol use. She reports  that she does not use drugs.  Allergies: Allergies  Allergen Reactions   Latex     Family History:  Family History  Problem Relation Age of Onset   Hypertension Mother    Heart Problems Mother    Cancer Father        lung and prostate ca   Diabetes Other    Breast cancer Cousin        2-twins   Colon cancer Neg Hx      Current Outpatient Medications:    aspirin EC 81 MG tablet, Take 81 mg by mouth daily., Disp: , Rfl:    Cranberry 1000 MG CAPS, Take 1 capsule by mouth daily. , Disp: , Rfl:    Diclofenac Sodium 1 % CREA, Apply a small amount to affected area once daily, Disp: 120 g, Rfl: 1   HYDROcodone-acetaminophen (NORCO/VICODIN) 5-325 MG tablet, Take 1 tablet by mouth every 6 (six) hours as needed., Disp: 15 tablet, Rfl: 0   hydrocortisone 2.5 % cream, Apply topically 2 (two) times daily., Disp: 30 g, Rfl: 0   losartan (COZAAR) 100 MG tablet, Take 1 tablet (100 mg total) by mouth daily., Disp: 90 tablet, Rfl: 1   Multiple Vitamins-Minerals (HAIR/SKIN/NAILS PO), Take 2 each by mouth daily., Disp: , Rfl:    nitroGLYCERIN (NITROSTAT) 0.4 MG SL tablet, Place 0.4 mg under the tongue every 5 (five) minutes as needed for chest pain., Disp: , Rfl:    Prenatal Vit-Fe Fumarate-FA (PRENATAL MULTIVITAMIN) TABS, Take 1 tablet by mouth daily at  12 noon., Disp: , Rfl:    vitamin B-12 (CYANOCOBALAMIN) 100 MCG tablet, Take 50 mcg by mouth daily., Disp: , Rfl:    vitamin C (ASCORBIC ACID) 500 MG tablet, Take 500 mg by mouth daily., Disp: , Rfl:    vitamin E 100 UNIT capsule, Take 100 Units by mouth daily., Disp: , Rfl:   Review of Systems:  Constitutional: Denies fever, chills, diaphoresis, appetite change and fatigue.  HEENT: Denies photophobia, eye pain, redness, hearing loss, ear pain, congestion, sore throat, rhinorrhea, sneezing, mouth sores, trouble swallowing, neck pain, neck stiffness and tinnitus.   Respiratory: Denies SOB, DOE, cough, chest tightness,  and  wheezing.   Cardiovascular: Denies chest pain, palpitations and leg swelling.  Gastrointestinal: Denies nausea, vomiting, abdominal pain, diarrhea, constipation, blood in stool and abdominal distention.  Genitourinary: Denies dysuria, urgency, frequency, hematuria, flank pain and difficulty urinating.  Endocrine: Denies: hot or cold intolerance, sweats, changes in hair or nails, polyuria, polydipsia. Musculoskeletal: Denies myalgias, back pain, joint swelling, arthralgias and gait problem.  Skin: Denies pallor, rash and wound.  Neurological: Denies dizziness, seizures, syncope, weakness, light-headedness, numbness and headaches.  Hematological: Denies adenopathy. Easy bruising, personal or family bleeding history  Psychiatric/Behavioral: Denies suicidal ideation, mood changes, confusion, nervousness, sleep disturbance and agitation    Physical Exam: Vitals:   09/22/19 1045  BP: (!) 150/100  Pulse: 71  Temp: 98 F (36.7 C)  TempSrc: Temporal  SpO2: 98%  Weight: 196 lb (88.9 kg)  Height: '5\' 3"'  (1.6 m)    Body mass index is 34.72 kg/m.   Constitutional: NAD, calm, comfortable Eyes: PERRL, lids and conjunctivae normal ENMT: Mucous membranes are moist.Tympanic membrane is pearly white, no erythema or bulging. Neck: normal, supple, no masses, no thyromegaly Respiratory: clear to auscultation bilaterally, no wheezing, no crackles. Normal respiratory effort. No accessory muscle use.  Cardiovascular: Regular rate and rhythm, no murmurs / rubs / gallops.  1+ pitting edema bilaterally.  2+ pedal pulses. No carotid bruits.  Abdomen: no tenderness, no masses palpated. No hepatosplenomegaly. Bowel sounds positive.  Musculoskeletal: no clubbing / cyanosis. No joint deformity upper and lower extremities. Good ROM, no contractures. Normal muscle tone.  Skin: no rashes, lesions, ulcers. No induration Neurologic: CN 2-12 grossly intact. Sensation intact, DTR normal. Strength 5/5 in all 4.    Psychiatric: Normal judgment and insight. Alert and oriented x 3. Normal mood.    Impression and Plan:  Encounter for preventive health examination -Has routine eye care, have recommended routine dental care. -She will receive first shingles vaccine today, otherwise immunizations are up-to-date. -Screening labs today. -Healthy lifestyle has been discussed in detail. -She had a colonoscopy in 2017 and is a 10-year callback. -She had a mammogram in June of this year that was interpreted as normal. -She saw her GYN, Dr. Garwin Brothers, in April who did her Pap smear then.  Essential hypertension  -Uncontrolled. -She is on max dose losartan, will add metoprolol 25 mg twice daily. -She will return in 6 weeks for blood pressure follow-up.  Obesity (BMI 30.0-34.9) -Discussed healthy lifestyle, including increased physical activity and better food choices to promote weight loss.  Hyperlipidemia associated with type 2 diabetes mellitus ([place]) -Check lipids today, not on statin currently.    Patient Instructions  -Nice seeing you today!!  -Lab work today; will notify you once results are available.  -Schedule dental appointment.  -First shingles vaccine today.  -START metoprolol 25 mg twice daily for your blood pressure.  -Low salt diet (see below).  -  Schedule follow up in 6 weeks.   Preventive Care 47-6 Years Old, Female Preventive care refers to visits with your health care provider and lifestyle choices that can promote health and wellness. This includes:  A yearly physical exam. This may also be called an annual well check.  Regular dental visits and eye exams.  Immunizations.  Screening for certain conditions.  Healthy lifestyle choices, such as eating a healthy diet, getting regular exercise, not using drugs or products that contain nicotine and tobacco, and limiting alcohol use. What can I expect for my preventive care visit? Physical exam Your health care provider  will check your:  Height and weight. This may be used to calculate body mass index (BMI), which tells if you are at a healthy weight.  Heart rate and blood pressure.  Skin for abnormal spots. Counseling Your health care provider may ask you questions about your:  Alcohol, tobacco, and drug use.  Emotional well-being.  Home and relationship well-being.  Sexual activity.  Eating habits.  Work and work Statistician.  Method of birth control.  Menstrual cycle.  Pregnancy history. What immunizations do I need?  Influenza (flu) vaccine  This is recommended every year. Tetanus, diphtheria, and pertussis (Tdap) vaccine  You may need a Td booster every 10 years. Varicella (chickenpox) vaccine  You may need this if you have not been vaccinated. Zoster (shingles) vaccine  You may need this after age 51. Measles, mumps, and rubella (MMR) vaccine  You may need at least one dose of MMR if you were born in 1957 or later. You may also need a second dose. Pneumococcal conjugate (PCV13) vaccine  You may need this if you have certain conditions and were not previously vaccinated. Pneumococcal polysaccharide (PPSV23) vaccine  You may need one or two doses if you smoke cigarettes or if you have certain conditions. Meningococcal conjugate (MenACWY) vaccine  You may need this if you have certain conditions. Hepatitis A vaccine  You may need this if you have certain conditions or if you travel or work in places where you may be exposed to hepatitis A. Hepatitis B vaccine  You may need this if you have certain conditions or if you travel or work in places where you may be exposed to hepatitis B. Haemophilus influenzae type b (Hib) vaccine  You may need this if you have certain conditions. Human papillomavirus (HPV) vaccine  If recommended by your health care provider, you may need three doses over 6 months. You may receive vaccines as individual doses or as more than one  vaccine together in one shot (combination vaccines). Talk with your health care provider about the risks and benefits of combination vaccines. What tests do I need? Blood tests  Lipid and cholesterol levels. These may be checked every 5 years, or more frequently if you are over 16 years old.  Hepatitis C test.  Hepatitis B test. Screening  Lung cancer screening. You may have this screening every year starting at age 37 if you have a 30-pack-year history of smoking and currently smoke or have quit within the past 15 years.  Colorectal cancer screening. All adults should have this screening starting at age 70 and continuing until age 18. Your health care provider may recommend screening at age 23 if you are at increased risk. You will have tests every 1-10 years, depending on your results and the type of screening test.  Diabetes screening. This is done by checking your blood sugar (glucose) after you have not  eaten for a while (fasting). You may have this done every 1-3 years.  Mammogram. This may be done every 1-2 years. Talk with your health care provider about when you should start having regular mammograms. This may depend on whether you have a family history of breast cancer.  BRCA-related cancer screening. This may be done if you have a family history of breast, ovarian, tubal, or peritoneal cancers.  Pelvic exam and Pap test. This may be done every 3 years starting at age 12. Starting at age 60, this may be done every 5 years if you have a Pap test in combination with an HPV test. Other tests  Sexually transmitted disease (STD) testing.  Bone density scan. This is done to screen for osteoporosis. You may have this scan if you are at high risk for osteoporosis. Follow these instructions at home: Eating and drinking  Eat a diet that includes fresh fruits and vegetables, whole grains, lean protein, and low-fat dairy.  Take vitamin and mineral supplements as recommended by your  health care provider.  Do not drink alcohol if: ? Your health care provider tells you not to drink. ? You are pregnant, may be pregnant, or are planning to become pregnant.  If you drink alcohol: ? Limit how much you have to 0-1 drink a day. ? Be aware of how much alcohol is in your drink. In the [place] one drink equals one 12 oz bottle of beer (355 mL), one 5 oz glass of wine (148 mL), or one 1 oz glass of hard liquor (44 mL). Lifestyle  Take daily care of your teeth and gums.  Stay active. Exercise for at least 30 minutes on 5 or more days each week.  Do not use any products that contain nicotine or tobacco, such as cigarettes, e-cigarettes, and chewing tobacco. If you need help quitting, ask your health care provider.  If you are sexually active, practice safe sex. Use a condom or other form of birth control (contraception) in order to prevent pregnancy and STIs (sexually transmitted infections).  If told by your health care provider, take low-dose aspirin daily starting at age 70. What's next?  Visit your health care provider once a year for a well check visit.  Ask your health care provider how often you should have your eyes and teeth checked.  Stay up to date on all vaccines. This information is not intended to replace advice given to you by your health care provider. Make sure you discuss any questions you have with your health care provider. Document Released: 01/03/2016 Document Revised: 08/18/2018 Document Reviewed: 08/18/2018 Elsevier Patient Education  2020 [place] DASH stands for "Dietary Approaches to Stop Hypertension." The DASH eating plan is a healthy eating plan that has been shown to reduce high blood pressure (hypertension). It may also reduce your risk for type 2 diabetes, heart disease, and stroke. The DASH eating plan may also help with weight loss. What are tips for following this plan?  General guidelines  Avoid eating more  than 2,300 mg (milligrams) of salt (sodium) a day. If you have hypertension, you may need to reduce your sodium intake to 1,500 mg a day.  Limit alcohol intake to no more than 1 drink a day for nonpregnant women and 2 drinks a day for men. One drink equals 12 oz of beer, 5 oz of wine, or 1 oz of hard liquor.  Work with your health care provider to maintain a healthy  body weight or to lose weight. Ask what an ideal weight is for you.  Get at least 30 minutes of exercise that causes your heart to beat faster (aerobic exercise) most days of the week. Activities may include walking, swimming, or biking.  Work with your health care provider or diet and nutrition specialist (dietitian) to adjust your eating plan to your individual calorie needs. Reading food labels   Check food labels for the amount of sodium per serving. Choose foods with less than 5 percent of the Daily Value of sodium. Generally, foods with less than 300 mg of sodium per serving fit into this eating plan.  To find whole grains, look for the word "whole" as the first word in the ingredient list. Shopping  Buy products labeled as "low-sodium" or "no salt added."  Buy fresh foods. Avoid canned foods and premade or frozen meals. Cooking  Avoid adding salt when cooking. Use salt-free seasonings or herbs instead of table salt or sea salt. Check with your health care provider or pharmacist before using salt substitutes.  Do not fry foods. Cook foods using healthy methods such as baking, boiling, grilling, and broiling instead.  Cook with heart-healthy oils, such as olive, canola, soybean, or sunflower oil. Meal planning  Eat a balanced diet that includes: ? 5 or more servings of fruits and vegetables each day. At each meal, try to fill half of your plate with fruits and vegetables. ? Up to 6-8 servings of whole grains each day. ? Less than 6 oz of lean meat, poultry, or fish each day. A 3-oz serving of meat is about the same  size as a deck of cards. One egg equals 1 oz. ? 2 servings of low-fat dairy each day. ? A serving of nuts, seeds, or beans 5 times each week. ? Heart-healthy fats. Healthy fats called Omega-3 fatty acids are found in foods such as flaxseeds and coldwater fish, like sardines, salmon, and mackerel.  Limit how much you eat of the following: ? Canned or prepackaged foods. ? Food that is high in trans fat, such as fried foods. ? Food that is high in saturated fat, such as fatty meat. ? Sweets, desserts, sugary drinks, and other foods with added sugar. ? Full-fat dairy products.  Do not salt foods before eating.  Try to eat at least 2 vegetarian meals each week.  Eat more home-cooked food and less restaurant, buffet, and fast food.  When eating at a restaurant, ask that your food be prepared with less salt or no salt, if possible. What foods are recommended? The items listed may not be a complete list. Talk with your dietitian about what dietary choices are best for you. Grains Whole-grain or whole-wheat bread. Whole-grain or whole-wheat pasta. Brown rice. [place]. Bulgur. Whole-grain and low-sodium cereals. Pita bread. Low-fat, low-sodium crackers. Whole-wheat flour tortillas. Vegetables Fresh or frozen vegetables (raw, steamed, roasted, or grilled). Low-sodium or reduced-sodium tomato and vegetable juice. Low-sodium or reduced-sodium tomato sauce and tomato paste. Low-sodium or reduced-sodium canned vegetables. Fruits All fresh, dried, or frozen fruit. Canned fruit in natural juice (without added sugar). Meat and other protein foods Skinless chicken or [place]. Ground chicken or [place]. Pork with fat trimmed off. Fish and seafood. Egg whites. Dried beans, peas, or lentils. Unsalted nuts, nut butters, and seeds. Unsalted canned beans. Lean cuts of beef with fat trimmed off. Low-sodium, lean deli meat. Dairy Low-fat (1%) or fat-free (skim) milk. Fat-free, low-fat, or reduced-fat  cheeses. Nonfat, low-sodium ricotta or  cottage cheese. Low-fat or nonfat yogurt. Low-fat, low-sodium cheese. Fats and oils Soft margarine without trans fats. Vegetable oil. Low-fat, reduced-fat, or light mayonnaise and salad dressings (reduced-sodium). Canola, safflower, olive, soybean, and sunflower oils. Avocado. Seasoning and other foods Herbs. Spices. Seasoning mixes without salt. Unsalted popcorn and pretzels. Fat-free sweets. What foods are not recommended? The items listed may not be a complete list. Talk with your dietitian about what dietary choices are best for you. Grains Baked goods made with fat, such as croissants, muffins, or some breads. Dry pasta or rice meal packs. Vegetables Creamed or fried vegetables. Vegetables in a cheese sauce. Regular canned vegetables (not low-sodium or reduced-sodium). Regular canned tomato sauce and paste (not low-sodium or reduced-sodium). Regular tomato and vegetable juice (not low-sodium or reduced-sodium). Angie Fava. Olives. Fruits Canned fruit in a light or heavy syrup. Fried fruit. Fruit in cream or butter sauce. Meat and other protein foods Fatty cuts of meat. Ribs. Fried meat. Berniece Salines. Sausage. Bologna and other processed lunch meats. Salami. Fatback. Hotdogs. Bratwurst. Salted nuts and seeds. Canned beans with added salt. Canned or smoked fish. Whole eggs or egg yolks. Chicken or [place] with skin. Dairy Whole or 2% milk, cream, and half-and-half. Whole or full-fat cream cheese. Whole-fat or sweetened yogurt. Full-fat cheese. Nondairy creamers. Whipped toppings. Processed cheese and cheese spreads. Fats and oils Butter. Stick margarine. Lard. Shortening. Ghee. Bacon fat. Tropical oils, such as coconut, palm kernel, or palm oil. Seasoning and other foods Salted popcorn and pretzels. Onion salt, garlic salt, seasoned salt, table salt, and sea salt. Worcestershire sauce. Tartar sauce. Barbecue sauce. Teriyaki sauce. Soy sauce, including  reduced-sodium. Steak sauce. Canned and packaged gravies. Fish sauce. Oyster sauce. Cocktail sauce. Horseradish that you find on the shelf. Ketchup. Mustard. Meat flavorings and tenderizers. Bouillon cubes. Hot sauce and Tabasco sauce. Premade or packaged marinades. Premade or packaged taco seasonings. Relishes. Regular salad dressings. Where to find more information:  National Heart, Lung, and [place]: https://wilson-eaton.com/  American Heart Association: www.heart.org Summary  The DASH eating plan is a healthy eating plan that has been shown to reduce high blood pressure (hypertension). It may also reduce your risk for type 2 diabetes, heart disease, and stroke.  With the DASH eating plan, you should limit salt (sodium) intake to 2,300 mg a day. If you have hypertension, you may need to reduce your sodium intake to 1,500 mg a day.  When on the DASH eating plan, aim to eat more fresh fruits and vegetables, whole grains, lean proteins, low-fat dairy, and heart-healthy fats.  Work with your health care provider or diet and nutrition specialist (dietitian) to adjust your eating plan to your individual calorie needs. This information is not intended to replace advice given to you by your health care provider. Make sure you discuss any questions you have with your health care provider. Document Released: 11/26/2011 Document Revised: 11/19/2017 Document Reviewed: 11/30/2016 Elsevier Patient Education  2020 [place], MD [facility]

## 2019-09-27 ENCOUNTER — Telehealth: Payer: Self-pay | Admitting: Internal Medicine

## 2019-09-27 NOTE — Telephone Encounter (Signed)
Pt stated Dr Jerilee Hoh was starting her on a 2nd BP medication metoprolol and Lipitor however they were not sent her pharmacy with her metFormin. She wants to make sure Dr. Jerilee Hoh is still sending these medications. Please advise. Requesting CB  [id number] - [place], [place] [phone] (Phone) [phone] (Fax)

## 2019-09-28 MED ORDER — ATORVASTATIN CALCIUM 40 MG PO TABS: 40.0000 mg | ORAL_TABLET | Freq: Every day | ORAL | 1 refills | Status: DC

## 2019-09-28 MED ORDER — METOPROLOL TARTRATE 25 MG PO TABS: 25.0000 mg | ORAL_TABLET | Freq: Two times a day (BID) | ORAL | 1 refills | Status: DC

## 2019-09-28 NOTE — Telephone Encounter (Signed)
Rxs sent per lab and office notes.  I called the pt and informed her of this and the Rx for vitamin D was previously sent.

## 2019-09-30 ENCOUNTER — Other Ambulatory Visit: Payer: Self-pay | Admitting: Internal Medicine

## 2019-10-19 ENCOUNTER — Telehealth: Payer: Self-pay | Admitting: *Deleted

## 2019-10-19 NOTE — Telephone Encounter (Signed)
Copied from [place] [id number]. Topic: Appointment Scheduling - Scheduling Inquiry for Clinic >> Oct 19, 2019  3:47 PM Alease Frame wrote: Reason for CRM: Patient would like a call back regarding a B12 shot . Please advise

## 2019-10-25 NOTE — Telephone Encounter (Signed)
Spoke with patient.  B12 levels are normal.  No need for injections at this time.

## 2019-12-26 ENCOUNTER — Ambulatory Visit: Payer: BC Managed Care – PPO | Admitting: Internal Medicine

## 2019-12-26 ENCOUNTER — Other Ambulatory Visit: Payer: Self-pay

## 2019-12-26 ENCOUNTER — Other Ambulatory Visit: Payer: Self-pay | Admitting: Internal Medicine

## 2019-12-26 ENCOUNTER — Encounter: Payer: Self-pay | Admitting: Internal Medicine

## 2019-12-26 VITALS — BP 130/80 | HR 60 | Temp 97.3°F | Wt 197.9 lb

## 2019-12-26 DIAGNOSIS — E785 Hyperlipidemia, unspecified: Secondary | ICD-10-CM

## 2019-12-26 DIAGNOSIS — E119 Type 2 diabetes mellitus without complications: Secondary | ICD-10-CM

## 2019-12-26 DIAGNOSIS — E1169 Type 2 diabetes mellitus with other specified complication: Secondary | ICD-10-CM

## 2019-12-26 DIAGNOSIS — E559 Vitamin D deficiency, unspecified: Secondary | ICD-10-CM | POA: Diagnosis not present

## 2019-12-26 DIAGNOSIS — I10 Essential (primary) hypertension: Secondary | ICD-10-CM

## 2019-12-26 LAB — POCT GLYCOSYLATED HEMOGLOBIN (HGB A1C): Hemoglobin A1C: 6.8 % — AB (ref 4.0–5.6)

## 2019-12-26 LAB — VITAMIN D 25 HYDROXY (VIT D DEFICIENCY, FRACTURES): VITD: 29 ng/mL — ABNORMAL LOW (ref 30.00–100.00)

## 2019-12-26 MED ORDER — ASPIRIN EC 81 MG PO TBEC: 81.0000 mg | DELAYED_RELEASE_TABLET | Freq: Every day | ORAL | 3 refills | Status: DC

## 2019-12-26 MED ORDER — VITAMIN D (ERGOCALCIFEROL) 1.25 MG (50000 UNIT) PO CAPS: 50000.0000 [IU] | ORAL_CAPSULE | ORAL | 0 refills | Status: AC

## 2019-12-26 NOTE — Patient Instructions (Signed)
-  Nice seeing you today!!  -Lab work today; will notify you once results are available.  -Make sure you schedule a diabetic eye exam soon.  -Schedule follow up in 3 months.

## 2019-12-26 NOTE — Progress Notes (Signed)
Established Patient Office Visit     This visit occurred during the SARS-CoV-2 public health emergency.  Safety protocols were in place, including screening questions prior to the visit, additional usage of staff PPE, and extensive cleaning of exam room while observing appropriate contact time as indicated for disinfecting solutions.    CC/Reason for Visit: 52-month follow-up chronic medical conditions  HPI: Katie Morse is a 54 y.o. female who is coming in today for the above mentioned reasons. Past Medical History is significant for: Uncontrolled hypertension, hyperlipidemia with an LDL of 124 in February 2018, well-controlled diabetes and vitamin D deficiency.  She has now completed 12 weeks of vitamin D and needs to have her levels rechecked.  She was started on Lipitor 40 mg in October.  At that time metoprolol 25 mg twice daily was added to her antihypertensive regimen due to uncontrolled blood pressure.  She is taking medications as prescribed.  She has no complaints today.   Past Medical/Surgical History: Past Medical History:  Diagnosis Date  . ALLERGIC RHINITIS 08/20/2008  . Allergy   . Anemia   . Blood transfusion without reported diagnosis 2011   post surgery  . Diabetes mellitus without complication ([place])   . DYSPNEA 11/12/2008  . Headache(784.0) 08/20/2008  . HYPERTENSION 08/20/2008  . LEIOMYOMA, UTERUS 08/20/2008  . PALPITATIONS, RECURRENT 11/12/2008  . Sickle-cell trait ([place]) 08/20/2008  . SYSTOLIC MURMUR 123XX123    Past Surgical History:  Procedure Laterality Date  . LIPOMA EXCISION Left 07/19/2019   Procedure: EXCISION OF SUBCUTANEOUS LIPOMA LEFT BUTTOCK;  Surgeon: Donnie Mesa, MD;  Location: [place];  Service: General;  Laterality: Left;  . MYOMECTOMY     fibroids    Social History:  reports that she has never smoked. She has never used smokeless tobacco. She reports current alcohol use. She reports that she does not use  drugs.  Allergies: Allergies  Allergen Reactions  . Latex     Family History:  Family History  Problem Relation Age of Onset  . Hypertension Mother   . Heart Problems Mother   . Cancer Father        lung and prostate ca  . Diabetes Other   . Breast cancer Cousin        2-twins  . Colon cancer Neg Hx      Current Outpatient Medications:  .  aspirin EC 81 MG tablet, Take 1 tablet (81 mg total) by mouth daily., Disp: 90 tablet, Rfl: 3 .  atorvastatin (LIPITOR) 40 MG tablet, Take 1 tablet (40 mg total) by mouth daily., Disp: 90 tablet, Rfl: 1 .  Cranberry 1000 MG CAPS, Take 1 capsule by mouth daily. , Disp: , Rfl:  .  Diclofenac Sodium 1 % CREA, Apply a small amount to affected area once daily, Disp: 120 g, Rfl: 1 .  HYDROcodone-acetaminophen (NORCO/VICODIN) 5-325 MG tablet, Take 1 tablet by mouth every 6 (six) hours as needed., Disp: 15 tablet, Rfl: 0 .  hydrocortisone 2.5 % cream, Apply topically 2 (two) times daily., Disp: 30 g, Rfl: 0 .  losartan (COZAAR) 100 MG tablet, TAKE 1 TABLET BY MOUTH EVERY DAY, Disp: 30 tablet, Rfl: 5 .  metFORMIN (GLUCOPHAGE) 500 MG tablet, Take 1 tablet (500 mg total) by mouth 2 (two) times daily with a meal., Disp: 180 tablet, Rfl: 1 .  metoprolol tartrate (LOPRESSOR) 25 MG tablet, Take 1 tablet (25 mg total) by mouth 2 (two) times daily., Disp: 180 tablet, Rfl:  1 .  Multiple Vitamins-Minerals (HAIR/SKIN/NAILS PO), Take 2 each by mouth daily., Disp: , Rfl:  .  nitroGLYCERIN (NITROSTAT) 0.4 MG SL tablet, Place 0.4 mg under the tongue every 5 (five) minutes as needed for chest pain., Disp: , Rfl:  .  Prenatal Vit-Fe Fumarate-FA (PRENATAL MULTIVITAMIN) TABS, Take 1 tablet by mouth daily at 12 noon., Disp: , Rfl:  .  vitamin B-12 (CYANOCOBALAMIN) 100 MCG tablet, Take 50 mcg by mouth daily., Disp: , Rfl:  .  vitamin C (ASCORBIC ACID) 500 MG tablet, Take 500 mg by mouth daily., Disp: , Rfl:  .  vitamin E 100 UNIT capsule, Take 100 Units by mouth daily.,  Disp: , Rfl:   Review of Systems:  Constitutional: Denies fever, chills, diaphoresis, appetite change and fatigue.  HEENT: Denies photophobia, eye pain, redness, hearing loss, ear pain, congestion, sore throat, rhinorrhea, sneezing, mouth sores, trouble swallowing, neck pain, neck stiffness and tinnitus.   Respiratory: Denies SOB, DOE, cough, chest tightness,  and wheezing.   Cardiovascular: Denies chest pain, palpitations and leg swelling.  Gastrointestinal: Denies nausea, vomiting, abdominal pain, diarrhea, constipation, blood in stool and abdominal distention.  Genitourinary: Denies dysuria, urgency, frequency, hematuria, flank pain and difficulty urinating.  Endocrine: Denies: hot or cold intolerance, sweats, changes in hair or nails, polyuria, polydipsia. Musculoskeletal: Denies myalgias, back pain, joint swelling, arthralgias and gait problem.  Skin: Denies pallor, rash and wound.  Neurological: Denies dizziness, seizures, syncope, weakness, light-headedness, numbness and headaches.  Hematological: Denies adenopathy. Easy bruising, personal or family bleeding history  Psychiatric/Behavioral: Denies suicidal ideation, mood changes, confusion, nervousness, sleep disturbance and agitation    Physical Exam: Vitals:   12/26/19 1043  BP: 130/80  Pulse: 60  Temp: (!) 97.3 F (36.3 C)  TempSrc: Temporal  SpO2: 97%  Weight: 197 lb 14.4 oz (89.8 kg)    Body mass index is 35.06 kg/m.   Constitutional: NAD, calm, comfortable Eyes: PERRL, lids and conjunctivae normal ENMT: Mucous membranes are moist.  Respiratory: clear to auscultation bilaterally, no wheezing, no crackles. Normal respiratory effort. No accessory muscle use.  Cardiovascular: Regular rate and rhythm, no murmurs / rubs / gallops. No extremity edema. 2+ pedal pulses.   Abdomen: no tenderness, no masses palpated. No hepatosplenomegaly. Bowel sounds positive.  Neurologic: Grossly intact and nonfocal Psychiatric: Normal  judgment and insight. Alert and oriented x 3. Normal mood.    Impression and Plan:  Controlled type 2 diabetes mellitus without complication, without long-term current use of insulin ([place])  -Well-controlled on Metformin, A1c 6.8 today.  Vitamin D deficiency  -Recheck vitamin D levels today.  Morbid obesity ([place]) -Discussed healthy lifestyle, including increased physical activity and better food choices to promote weight loss.  Hyperlipidemia associated with type 2 diabetes mellitus ([place]) -LDL was 112 in October 2020, goal less than 70. -Continue Lipitor 40 mg, recheck lipids in 6 months since commencement of statin.  Essential hypertension -Well-controlled on current regimen.    Patient Instructions  -Nice seeing you today!!  -Lab work today; will notify you once results are available.  -Make sure you schedule a diabetic eye exam soon.  -Schedule follow up in 3 months.       Lelon Frohlich, MD [facility]

## 2019-12-26 NOTE — Addendum Note (Signed)
Addended by: Suzette Battiest on: 12/26/2019 11:12 AM   Modules accepted: Orders

## 2019-12-29 ENCOUNTER — Other Ambulatory Visit: Payer: Self-pay | Admitting: Internal Medicine

## 2019-12-29 DIAGNOSIS — E559 Vitamin D deficiency, unspecified: Secondary | ICD-10-CM

## 2020-03-05 ENCOUNTER — Telehealth: Payer: Self-pay | Admitting: Internal Medicine

## 2020-03-05 NOTE — Telephone Encounter (Signed)
Patient dropped off a form for medical evaluation  Call patient to pick up form at: [phone]  Disposition: Dr's Folder

## 2020-03-06 NOTE — Telephone Encounter (Signed)
Placed in Dr Hernandez's folder 

## 2020-03-12 NOTE — Telephone Encounter (Signed)
Form ready for pick up and patient is aware. Copy made for chart.

## 2020-03-25 NOTE — Telephone Encounter (Signed)
Pt came by and picked up form

## 2020-03-28 ENCOUNTER — Other Ambulatory Visit: Payer: Self-pay | Admitting: Internal Medicine

## 2020-03-28 DIAGNOSIS — E119 Type 2 diabetes mellitus without complications: Secondary | ICD-10-CM

## 2020-04-15 LAB — HM MAMMOGRAPHY

## 2020-04-17 ENCOUNTER — Encounter: Payer: Self-pay | Admitting: Internal Medicine

## 2020-04-18 ENCOUNTER — Telehealth: Payer: Self-pay | Admitting: Internal Medicine

## 2020-04-18 NOTE — Telephone Encounter (Signed)
Flonase will not help for these symptoms. She needs to take an antihistamine: allegra, claritin or zyrtec daily. If nasal congestion is prominent we can do flonase as well.

## 2020-04-18 NOTE — Telephone Encounter (Signed)
Pt is having seasonal allergies and it is effecting her pretty hard this season. She is wondering if her PCP will call in something for her allergies such as fluticasone propionate and if she will refill an inhaler she used to have albuterol.   Pt is having itchy eyes, effecting her asthma, and scratchy throat.   Pharmacy: [facility] [address]  [place]: [phone]

## 2020-04-18 NOTE — Telephone Encounter (Signed)
Patient is aware 

## 2020-04-23 ENCOUNTER — Other Ambulatory Visit: Payer: Self-pay

## 2020-04-24 ENCOUNTER — Ambulatory Visit (INDEPENDENT_AMBULATORY_CARE_PROVIDER_SITE_OTHER): Payer: BC Managed Care – PPO | Admitting: Internal Medicine

## 2020-04-24 ENCOUNTER — Encounter: Payer: Self-pay | Admitting: Internal Medicine

## 2020-04-24 VITALS — BP 120/78 | HR 82 | Temp 98.1°F | Ht 62.5 in | Wt 195.7 lb

## 2020-04-24 DIAGNOSIS — M545 Low back pain, unspecified: Secondary | ICD-10-CM

## 2020-04-24 DIAGNOSIS — G8929 Other chronic pain: Secondary | ICD-10-CM | POA: Diagnosis not present

## 2020-04-24 DIAGNOSIS — M25512 Pain in left shoulder: Secondary | ICD-10-CM | POA: Diagnosis not present

## 2020-04-24 DIAGNOSIS — J302 Other seasonal allergic rhinitis: Secondary | ICD-10-CM | POA: Diagnosis not present

## 2020-04-24 NOTE — Progress Notes (Signed)
Established Patient Office Visit     This visit occurred during the SARS-CoV-2 public health emergency.  Safety protocols were in place, including screening questions prior to the visit, additional usage of staff PPE, and extensive cleaning of exam room while observing appropriate contact time as indicated for disinfecting solutions.    CC/Reason for Visit: Discuss acute complaints  HPI: Katie Morse is a 54 y.o. female who is coming in today for the above mentioned reasons. She is here today to discuss some concerns:  1. Seasonal allergies have been very bad this year. She has a constant runny nose, cough and watery eyes.  2. She suffered a back injury when she was 20 and will occasionally have back flareups. Yesterday pain was so bad "I could barely walk". She has been using Tylenol. She has no radiculopathy. Pain is located over center of lower back and radiates to both sides.  3. She has chronic left shoulder pain, has had steroid injections in the past. Was told that if pain returned to come back for another injection. She would like that to be scheduled.   Past Medical/Surgical History: Past Medical History:  Diagnosis Date  . ALLERGIC RHINITIS 08/20/2008  . Allergy   . Anemia   . Blood transfusion without reported diagnosis 2011   post surgery  . Diabetes mellitus without complication ([place])   . DYSPNEA 11/12/2008  . Headache(784.0) 08/20/2008  . HYPERTENSION 08/20/2008  . LEIOMYOMA, UTERUS 08/20/2008  . PALPITATIONS, RECURRENT 11/12/2008  . Sickle-cell trait ([place]) 08/20/2008  . SYSTOLIC MURMUR 123XX123    Past Surgical History:  Procedure Laterality Date  . LIPOMA EXCISION Left 07/19/2019   Procedure: EXCISION OF SUBCUTANEOUS LIPOMA LEFT BUTTOCK;  Surgeon: Donnie Mesa, MD;  Location: [place];  Service: General;  Laterality: Left;  . MYOMECTOMY     fibroids    Social History:  reports that she has never smoked. She has never  used smokeless tobacco. She reports current alcohol use. She reports that she does not use drugs.  Allergies: Allergies  Allergen Reactions  . Latex     Family History:  Family History  Problem Relation Age of Onset  . Hypertension Mother   . Heart Problems Mother   . Cancer Father        lung and prostate ca  . Diabetes Other   . Breast cancer Cousin        2-twins  . Colon cancer Neg Hx      Current Outpatient Medications:  .  aspirin EC 81 MG tablet, Take 1 tablet (81 mg total) by mouth daily., Disp: 90 tablet, Rfl: 3 .  atorvastatin (LIPITOR) 40 MG tablet, Take 1 tablet (40 mg total) by mouth daily., Disp: 90 tablet, Rfl: 1 .  Cranberry 1000 MG CAPS, Take 1 capsule by mouth daily. , Disp: , Rfl:  .  Diclofenac Sodium 1 % CREA, Apply a small amount to affected area once daily, Disp: 120 g, Rfl: 1 .  HYDROcodone-acetaminophen (NORCO/VICODIN) 5-325 MG tablet, Take 1 tablet by mouth every 6 (six) hours as needed., Disp: 15 tablet, Rfl: 0 .  hydrocortisone 2.5 % cream, Apply topically 2 (two) times daily., Disp: 30 g, Rfl: 0 .  losartan (COZAAR) 100 MG tablet, TAKE 1 TABLET BY MOUTH EVERY DAY, Disp: 30 tablet, Rfl: 5 .  metFORMIN (GLUCOPHAGE) 500 MG tablet, TAKE 1 TABLET (500 MG TOTAL) BY MOUTH 2 (TWO) TIMES DAILY WITH A MEAL., Disp: 180 tablet, Rfl:  1 .  metoprolol tartrate (LOPRESSOR) 25 MG tablet, Take 1 tablet (25 mg total) by mouth 2 (two) times daily., Disp: 180 tablet, Rfl: 1 .  Multiple Vitamins-Minerals (HAIR/SKIN/NAILS PO), Take 2 each by mouth daily., Disp: , Rfl:  .  nitroGLYCERIN (NITROSTAT) 0.4 MG SL tablet, Place 0.4 mg under the tongue every 5 (five) minutes as needed for chest pain., Disp: , Rfl:  .  Prenatal Vit-Fe Fumarate-FA (PRENATAL MULTIVITAMIN) TABS, Take 1 tablet by mouth daily at 12 noon., Disp: , Rfl:  .  vitamin B-12 (CYANOCOBALAMIN) 100 MCG tablet, Take 50 mcg by mouth daily., Disp: , Rfl:  .  vitamin C (ASCORBIC ACID) 500 MG tablet, Take 500 mg by  mouth daily., Disp: , Rfl:  .  vitamin E 100 UNIT capsule, Take 100 Units by mouth daily., Disp: , Rfl:   Review of Systems:  Constitutional: Denies fever, chills, diaphoresis, appetite change and fatigue.  HEENT: Denies photophobia,  hearing loss, ear pain,  mouth sores, trouble swallowing, neck pain, neck stiffness and tinnitus.   Respiratory: Denies SOB, DOE, cough, chest tightness,  and wheezing.   Cardiovascular: Denies chest pain, palpitations and leg swelling.  Gastrointestinal: Denies nausea, vomiting, abdominal pain, diarrhea, constipation, blood in stool and abdominal distention.  Genitourinary: Denies dysuria, urgency, frequency, hematuria, flank pain and difficulty urinating.  Endocrine: Denies: hot or cold intolerance, sweats, changes in hair or nails, polyuria, polydipsia. Musculoskeletal: Denies myalgias, , joint swelling and gait problem.  Skin: Denies pallor, rash and wound.  Neurological: Denies dizziness, seizures, syncope, weakness, light-headedness, numbness and headaches.  Hematological: Denies adenopathy. Easy bruising, personal or family bleeding history  Psychiatric/Behavioral: Denies suicidal ideation, mood changes, confusion, nervousness, sleep disturbance and agitation    Physical Exam: Vitals:   04/24/20 1103  BP: 120/78  Pulse: 82  Temp: 98.1 F (36.7 C)  TempSrc: Temporal  SpO2: 96%  Weight: 195 lb 11.2 oz (88.8 kg)  Height: 5' 2.5" (1.588 m)    Body mass index is 35.22 kg/m.   Constitutional: NAD, calm, comfortable Eyes: PERRL, lids and conjunctivae normal ENMT: Mucous membranes are moist.  Respiratory: clear to auscultation bilaterally, no wheezing, no crackles. Normal respiratory effort. No accessory muscle use.  Cardiovascular: Regular rate and rhythm, no murmurs / rubs / gallops. No extremity edema.  Neurologic: Grossly intact and nonfocal  Psychiatric: Normal judgment and insight. Alert and oriented x 3. Normal mood.    Impression and  Plan:  Seasonal allergies -Advised daily antihistamine use and Pataday eyedrops.  Chronic left shoulder pain  - Plan: Ambulatory referral to Sports Medicine  Acute bilateral low back pain without sciatica -Likely muscular. -Advised icing, as needed NSAIDs/Tylenol, back exercises (handout provided), local massage therapy. -Can consider PT referral if no improvement.    Patient Instructions  -Nice seeing you today!!  -For your allergies: Zyrtec 1 tablet daily and Pataday eye drops 1 drop in each eye daily.  -For your back: icing for 15 mins at least twice a day, tylenol or ibuprofen as needed, back exercises provided, local massage therapy.  -Schedule follow up in 14 days if no better.      Lelon Frohlich, MD [facility] at [facility]

## 2020-04-24 NOTE — Patient Instructions (Signed)
-  Nice seeing you today!!  -For your allergies: Zyrtec 1 tablet daily and Pataday eye drops 1 drop in each eye daily.  -For your back: icing for 15 mins at least twice a day, tylenol or ibuprofen as needed, back exercises provided, local massage therapy.  -Schedule follow up in 14 days if no better.

## 2020-04-27 ENCOUNTER — Other Ambulatory Visit: Payer: Self-pay | Admitting: Internal Medicine

## 2020-04-27 DIAGNOSIS — E559 Vitamin D deficiency, unspecified: Secondary | ICD-10-CM

## 2020-04-30 ENCOUNTER — Encounter: Payer: Self-pay | Admitting: Family Medicine

## 2020-04-30 ENCOUNTER — Other Ambulatory Visit: Payer: Self-pay

## 2020-04-30 ENCOUNTER — Ambulatory Visit (INDEPENDENT_AMBULATORY_CARE_PROVIDER_SITE_OTHER): Payer: BC Managed Care – PPO

## 2020-04-30 ENCOUNTER — Ambulatory Visit: Payer: Self-pay

## 2020-04-30 ENCOUNTER — Ambulatory Visit (INDEPENDENT_AMBULATORY_CARE_PROVIDER_SITE_OTHER): Payer: BC Managed Care – PPO | Admitting: Family Medicine

## 2020-04-30 VITALS — BP 120/80 | HR 82 | Ht 62.5 in | Wt 198.2 lb

## 2020-04-30 DIAGNOSIS — M25562 Pain in left knee: Secondary | ICD-10-CM | POA: Diagnosis not present

## 2020-04-30 DIAGNOSIS — G8929 Other chronic pain: Secondary | ICD-10-CM | POA: Diagnosis not present

## 2020-04-30 DIAGNOSIS — M25512 Pain in left shoulder: Secondary | ICD-10-CM

## 2020-04-30 DIAGNOSIS — M25561 Pain in right knee: Secondary | ICD-10-CM

## 2020-04-30 NOTE — Progress Notes (Signed)
Subjective:    I'm seeing this patient as a consultation for:  Dr. Isaac Bliss. Note will be routed back to referring provider/PCP.  CC: L shoulder pain  I, Molly Weber, LAT, ATC, am serving as scribe for Dr. Lynne Leader.  HPI: Pt is a 54 y/o female presenting w/ c/o chronic L shoulder pain that recently flared up in November 2020.  She locates her pain to her L lateral upper arm.  She rates her pain as moderate and describes her pain as aching and throbbing.  Radiating pain: yes into her L lateral upper arm L shoulder mechanical symptoms: arm Aggravating factors: L sidelying; L shoulder active aBd Treatments tried: has had prior L shoulder injections, Tylenol Arthritis; Voltaren; Aspercreme w/ lidocaine She saw Dr. Kyung Rudd orthopedics for this March 2020 and had subacromial injection and lasted until around November.  B knee pain: She has had chronic B knee pain, L>R.  She is also asking about knee injections.  Diagnostic testing: c-spine and L shoulder XR- 02/22/19  Past medical history, Surgical history, Family history, Social history, Allergies, and medications have been entered into the medical record, reviewed.   Review of Systems: No new headache, visual changes, nausea, vomiting, diarrhea, constipation, dizziness, abdominal pain, skin rash, fevers, chills, night sweats, weight loss, swollen lymph nodes, body aches, joint swelling, muscle aches, chest pain, shortness of breath, mood changes, visual or auditory hallucinations.   Objective:    Vitals:   04/30/20 1045  BP: 120/80  Pulse: 82  SpO2: 98%   General: Well Developed, well nourished, and in no acute distress.  Neuro/Psych: Alert and oriented x3, extra-ocular muscles intact, able to move all 4 extremities, sensation grossly intact. Skin: Warm and dry, no rashes noted.  Respiratory: Not using accessory muscles, speaking in full sentences, trachea midline.  Cardiovascular: Pulses palpable, no extremity  edema. Abdomen: Does not appear distended. MSK:  Left shoulder normal-appearing nontender. Range of motion full abduction.  However painful arc.  Normal external rotation.  Internal rotation to lumbar spine. Strength intact abduction external and internal rotation however some pain with abduction. Positive Hawkins and Neer's test. Positive empty can test. Negative Yergason's and speeds test.  Knees bilaterally with crepitation with extension.  Range of motion normal. Normal gait.  Lab and Radiology Results  X-ray images left shoulder obtained March 2020 reviewed showing mild AC DJD per my personal independent review.  X-ray images knees bilaterally obtained today personally independent reviewed  Right knee: DJD again more prominent medial compartment where is more severe.  Lateral compartment and patellofemoral compartment mild DJD.  No acute fractures.  Left knee: DJD most prominent moderate at medial compartment.  More mild at lateral and patellofemoral compartment.  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  No acute fractures.  Await formal radiology review.  Procedure: Real-time Ultrasound Guided Injection of left shoulder subacromial bursa Device: Philips Affiniti 50G Images permanently stored and available for review in the ultrasound unit. Verbal informed consent obtained.  Discussed risks and benefits of procedure. Warned about infection bleeding damage to structures skin hypopigmentation and fat atrophy among others. Patient expresses understanding and agreement Time-out conducted.   Noted no overlying erythema, induration, or other signs of local infection.   Skin prepped in a sterile fashion.   Local anesthesia: Topical Ethyl chloride.   With sterile technique and under real time ultrasound guidance:  40 mg of Kenalog and 2 mL of Marcaine injected easily.   Completed without difficulty  Pain immediately resolved suggesting accurate  placement of the medication.   Advised to call if fevers/chills, erythema, induration, drainage, or persistent bleeding.   Images permanently stored and available for review in the ultrasound unit.  Impression: Technically successful ultrasound guided injection.    Lab Results  Component Value Date   HGBA1C 6.8 (A) 12/26/2019       Impression and Recommendations:    Assessment and Plan: 54 y.o. female with  Left shoulder pain.  Subacromial bursitis/rotator cuff tendinopathy.  Plan for injection as above today as she had great response previously and home exercise program.  Check back as needed for this issue.  Consider formal physical therapy in the future.  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  Discussed options for this.  Likely will proceed with steroid injection however would like to minimize steroid injections anyone visit due to her diabetes although it is reasonably controlled with last A1c 6.8 January of this year.  Plan for recheck near future consider steroid injection and dedicated knee exam.  PDMP not reviewed this encounter. Orders Placed This Encounter  Procedures  . [place] LIMITED JOINT SPACE STRUCTURES UP LEFT(NO LINKED CHARGES)    Order Specific Question:   Reason for Exam (SYMPTOM  OR DIAGNOSIS REQUIRED)    Answer:   L shoulder pain    Order Specific Question:   Preferred imaging location?    Answer:   [facility]  . DG Knee AP/LAT W/Sunrise Right    Standing Status:   Future    Number of Occurrences:   1    Standing Expiration Date:   06/30/2021    Order Specific Question:   Reason for Exam (SYMPTOM  OR DIAGNOSIS REQUIRED)    Answer:   eval BL knee Pain    Order Specific Question:   Is patient pregnant?    Answer:   No    Order Specific Question:   Preferred imaging location?    Answer:   [facility]    Order Specific Question:   Radiology Contrast Protocol - do NOT remove file path    Answer:    \\[email or web address]  . DG Knee AP/LAT W/Sunrise Left    Standing Status:   Future    Number of Occurrences:   1    Standing Expiration Date:   06/30/2021    Order Specific Question:   Reason for Exam (SYMPTOM  OR DIAGNOSIS REQUIRED)    Answer:   eval BL knee pain    Order Specific Question:   Is patient pregnant?    Answer:   No    Order Specific Question:   Preferred imaging location?    Answer:   [facility]    Order Specific Question:   Radiology Contrast Protocol - do NOT remove file path    Answer:   \\[email or web address]   No orders of the defined types were placed in this encounter.   Discussed warning signs or symptoms. Please see discharge instructions. Patient expresses understanding.   The above documentation has been reviewed and is accurate and complete Lynne Leader

## 2020-04-30 NOTE — Patient Instructions (Signed)
Thank you for coming in today. Call or go to the ER if you develop a large red swollen joint with extreme pain or oozing puss.   Get xray today.  Recheck in 1-2 weeks for knee injection.   Start exercises.    Shoulder Impingement Syndrome Rehab Ask your health care provider which exercises are safe for you. Do exercises exactly as told by your health care provider and adjust them as directed. It is normal to feel mild stretching, pulling, tightness, or discomfort as you do these exercises. Stop right away if you feel sudden pain or your pain gets worse. Do not begin these exercises until told by your health care provider. Stretching and range-of-motion exercise This exercise warms up your muscles and joints and improves the movement and flexibility of your shoulder. This exercise also helps to relieve pain and stiffness. Passive horizontal adduction In passive adduction, you use your other hand to move the injured arm toward your body. The injured arm does not move on its own. In this movement, your arm is moved across your body in the horizontal plane (horizontal adduction). 1. Sit or stand and pull your left / right elbow across your chest, toward your other shoulder. Stop when you feel a gentle stretch in the back of your shoulder and upper arm. ? Keep your arm at shoulder height. ? Keep your arm as close to your body as you comfortably can. 2. Hold for __________ seconds. 3. Slowly return to the starting position. Repeat __________ times. Complete this exercise __________ times a day. Strengthening exercises These exercises build strength and endurance in your shoulder. Endurance is the ability to use your muscles for a long time, even after they get tired. External rotation, isometric This is an exercise in which you press the back of your wrist against a door frame without moving your shoulder joint (isometric). 1. Stand or sit in a doorway, facing the door frame. 2. Bend your left /  right elbow and place the back of your wrist against the door frame. Only the back of your wrist should be touching the frame. Keep your upper arm at your side. 3. Gently press your wrist against the door frame, as if you are trying to push your arm away from your abdomen (external rotation). Press as hard as you are able without pain. ? Avoid shrugging your shoulder while you press your wrist against the door frame. Keep your shoulder blade tucked down toward the middle of your back. 4. Hold for __________ seconds. 5. Slowly release the tension, and relax your muscles completely before you repeat the exercise. Repeat __________ times. Complete this exercise __________ times a day. Internal rotation, isometric This is an exercise in which you press your palm against a door frame without moving your shoulder joint (isometric). 1. Stand or sit in a doorway, facing the door frame. 2. Bend your left / right elbow and place the palm of your hand against the door frame. Only your palm should be touching the frame. Keep your upper arm at your side. 3. Gently press your hand against the door frame, as if you are trying to push your arm toward your abdomen (internal rotation). Press as hard as you are able without pain. ? Avoid shrugging your shoulder while you press your hand against the door frame. Keep your shoulder blade tucked down toward the middle of your back. 4. Hold for __________ seconds. 5. Slowly release the tension, and relax your muscles completely before you  repeat the exercise. Repeat __________ times. Complete this exercise __________ times a day. Scapular protraction, supine  1. Lie on your back on a firm surface (supine position). Hold a __________ weight in your left / right hand. 2. Raise your left / right arm straight into the air so your hand is directly above your shoulder joint. 3. Push the weight into the air so your shoulder (scapula) lifts off the surface that you are lying on.  The scapula will push up or forward (protraction). Do not move your head, neck, or back. 4. Hold for __________ seconds. 5. Slowly return to the starting position. Let your muscles relax completely before you repeat this exercise. Repeat __________ times. Complete this exercise __________ times a day. Scapular retraction  1. Sit in a stable chair without armrests, or stand up. 2. Secure an exercise band to a stable object in front of you so the band is at shoulder height. 3. Hold one end of the exercise band in each hand. Your palms should face down. 4. Squeeze your shoulder blades together (retraction) and move your elbows slightly behind you. Do not shrug your shoulders upward while you do this. 5. Hold for __________ seconds. 6. Slowly return to the starting position. Repeat __________ times. Complete this exercise __________ times a day. Shoulder extension  1. Sit in a stable chair without armrests, or stand up. 2. Secure an exercise band to a stable object in front of you so the band is above shoulder height. 3. Hold one end of the exercise band in each hand. 4. Straighten your elbows and lift your hands up to shoulder height. 5. Squeeze your shoulder blades together and pull your hands down to the sides of your thighs (extension). Stop when your hands are straight down by your sides. Do not let your hands go behind your body. 6. Hold for __________ seconds. 7. Slowly return to the starting position. Repeat __________ times. Complete this exercise __________ times a day. This information is not intended to replace advice given to you by your health care provider. Make sure you discuss any questions you have with your health care provider. Document Revised: 03/31/2019 Document Reviewed: 01/02/2019 Elsevier Patient Education  [place].

## 2020-05-01 NOTE — Progress Notes (Signed)
Right knee x-ray shows arthritis as well.

## 2020-05-01 NOTE — Progress Notes (Signed)
Left knee x-ray shows somewhat severe arthritis.

## 2020-05-02 ENCOUNTER — Other Ambulatory Visit: Payer: Self-pay

## 2020-05-02 ENCOUNTER — Telehealth: Payer: Self-pay | Admitting: Internal Medicine

## 2020-05-02 NOTE — Telephone Encounter (Signed)
Patient has tried multiple fingers and can't get a sugar reading- she is concerned because last time this happened she was losing blood and had to get a blood transfusion.  Patient is scheduled to see Dr Sarajane Jews on 05/03/20 due to pcp not available.

## 2020-05-02 NOTE — Telephone Encounter (Signed)
Spoke with Tammy and Dr Jerilee Hoh and patient was advised to schedule an office visit for labs.

## 2020-05-03 ENCOUNTER — Other Ambulatory Visit: Payer: Self-pay | Admitting: Internal Medicine

## 2020-05-03 ENCOUNTER — Ambulatory Visit (INDEPENDENT_AMBULATORY_CARE_PROVIDER_SITE_OTHER): Payer: BC Managed Care – PPO | Admitting: Family Medicine

## 2020-05-03 ENCOUNTER — Encounter: Payer: Self-pay | Admitting: Family Medicine

## 2020-05-03 VITALS — BP 140/72 | HR 71 | Temp 97.3°F | Wt 197.3 lb

## 2020-05-03 DIAGNOSIS — E119 Type 2 diabetes mellitus without complications: Secondary | ICD-10-CM

## 2020-05-03 LAB — CBC WITH DIFFERENTIAL/PLATELET
Basophils Absolute: 0.1 10*3/uL (ref 0.0–0.1)
Basophils Relative: 1 % (ref 0.0–3.0)
Eosinophils Absolute: 0.1 10*3/uL (ref 0.0–0.7)
Eosinophils Relative: 1.2 % (ref 0.0–5.0)
HCT: 36.2 % (ref 36.0–46.0)
Hemoglobin: 12.2 g/dL (ref 12.0–15.0)
Lymphocytes Relative: 28.7 % (ref 12.0–46.0)
Lymphs Abs: 1.9 10*3/uL (ref 0.7–4.0)
MCHC: 33.7 g/dL (ref 30.0–36.0)
MCV: 80.9 fl (ref 78.0–100.0)
Monocytes Absolute: 0.6 10*3/uL (ref 0.1–1.0)
Monocytes Relative: 9.5 % (ref 3.0–12.0)
Neutro Abs: 3.8 10*3/uL (ref 1.4–7.7)
Neutrophils Relative %: 59.6 % (ref 43.0–77.0)
Platelets: 272 10*3/uL (ref 150.0–400.0)
RBC: 4.47 Mil/uL (ref 3.87–5.11)
RDW: 14.7 % (ref 11.5–15.5)
WBC: 6.5 10*3/uL (ref 4.0–10.5)

## 2020-05-03 LAB — HEMOGLOBIN A1C: Hgb A1c MFr Bld: 7.1 % — ABNORMAL HIGH (ref 4.6–6.5)

## 2020-05-03 NOTE — Progress Notes (Signed)
   Subjective:    Patient ID: Katie Morse, female    DOB: 1966/11/10, 54 y.o.   MRN: [id number]  HPI Here because she has been unable to get any blood out of her fingers for thre past 2 days when she uses a fingerstick. She says this happened a few years ago when she was quite anemic. She feels fine in general. I see her last Hgb in October was 12. Her A1c in January was 6.8.    Review of Systems  Constitutional: Negative.   Respiratory: Negative.   Cardiovascular: Negative.        Objective:   Physical Exam Constitutional:      Appearance: Normal appearance. She is not ill-appearing.  Cardiovascular:     Rate and Rhythm: Normal rate and regular rhythm.     Pulses: Normal pulses.     Heart sounds: Normal heart sounds.  Pulmonary:     Effort: Pulmonary effort is normal.     Breath sounds: Normal breath sounds.  Neurological:     Mental Status: She is alert.           Assessment & Plan:  Inability to produce a fingerstick specimen. This was most likely the result of her hands being cold. I advised her to wear some gloves for 15 minutes before attempting a FS so they will be warm. To be complete, we will get a CBC and an A1c today.  Alysia Penna, MD

## 2020-05-14 ENCOUNTER — Ambulatory Visit (INDEPENDENT_AMBULATORY_CARE_PROVIDER_SITE_OTHER): Payer: BC Managed Care – PPO | Admitting: Family Medicine

## 2020-05-14 ENCOUNTER — Ambulatory Visit (INDEPENDENT_AMBULATORY_CARE_PROVIDER_SITE_OTHER): Payer: BC Managed Care – PPO

## 2020-05-14 ENCOUNTER — Other Ambulatory Visit: Payer: Self-pay

## 2020-05-14 ENCOUNTER — Encounter: Payer: Self-pay | Admitting: Family Medicine

## 2020-05-14 VITALS — BP 138/90 | HR 71 | Ht 62.5 in | Wt 197.0 lb

## 2020-05-14 DIAGNOSIS — M25562 Pain in left knee: Secondary | ICD-10-CM | POA: Diagnosis not present

## 2020-05-14 DIAGNOSIS — M25561 Pain in right knee: Secondary | ICD-10-CM | POA: Diagnosis not present

## 2020-05-14 DIAGNOSIS — G8929 Other chronic pain: Secondary | ICD-10-CM

## 2020-05-14 NOTE — Progress Notes (Signed)
Rito Ehrlich, am serving as a Education administrator for Dr. Lynne Leader.  Katie Morse is a 54 y.o. female who presents to [facility] today for f/u of L shoulder and B knee pain.  She was last seen by Dr. Georgina Snell on 04/30/20 and had a L shoulder injection.  Since her last visit, pt reports knees started hurting last night aching and shoulder is doing very well.   Diagnostic imaging: B knee XR- 04/30/20   Pertinent review of systems: No fevers or chills  Relevant historical information: Well controlled diabetes   Exam:  BP 138/90 (BP Location: Left Arm, Patient Position: Sitting, Cuff Size: Normal)   Pulse 71   Ht 5' 2.5" (1.588 m)   Wt 197 lb (89.4 kg)   LMP 07/04/2014 Comment: GYN  SpO2 99%   BMI 35.46 kg/m  General: Well Developed, well nourished, and in no acute distress.   MSK: Right knee normal-appearing no significant effusion or erythema nontender normal motion with crepitation. Left knee normal-appearing no significant effusion or erythema nontender normal motion with crepitation.     Lab and Radiology Results  Procedure: Real-time Ultrasound Guided Injection of right knee Device: Philips Affiniti 50G Images permanently stored and available for review in the ultrasound unit. Verbal informed consent obtained.  Discussed risks and benefits of procedure. Warned about infection bleeding damage to structures skin hypopigmentation and fat atrophy among others. Patient expresses understanding and agreement Time-out conducted.   Noted no overlying erythema, induration, or other signs of local infection.   Skin prepped in a sterile fashion.   Local anesthesia: Topical Ethyl chloride.   With sterile technique and under real time ultrasound guidance:  40 mg of Kenalog and 3 mL of Marcaine injected easily.   Completed without difficulty   Pain immediately resolved suggesting accurate placement of the medication.   Advised to call if  fevers/chills, erythema, induration, drainage, or persistent bleeding.   Images permanently stored and available for review in the ultrasound unit.  Impression: Technically successful ultrasound guided injection.   Procedure: Real-time Ultrasound Guided Injection of left knee Device: Philips Affiniti 50G Images permanently stored and available for review in the ultrasound unit. Verbal informed consent obtained.  Discussed risks and benefits of procedure. Warned about infection bleeding damage to structures skin hypopigmentation and fat atrophy among others. Patient expresses understanding and agreement Time-out conducted.   Noted no overlying erythema, induration, or other signs of local infection.   Skin prepped in a sterile fashion.   Local anesthesia: Topical Ethyl chloride.   With sterile technique and under real time ultrasound guidance:  40 mg of Kenalog and 3 mL of Marcaine injected easily.   Completed without difficulty   Pain immediately resolved suggesting accurate placement of the medication.   Advised to call if fevers/chills, erythema, induration, drainage, or persistent bleeding.   Images permanently stored and available for review in the ultrasound unit.  Impression: Technically successful ultrasound guided injection.    Assessment and Plan: 54 y.o. female with bilateral knee pain due to DJD.  Plan for an injection bilaterally today.  Discussed next steps if needed.  Recheck as needed.  Precautions reviewed.  Decided to proceed with bilateral knee injections.  Patient tolerated the initial unilateral shoulder injection recently quite well with no significant increase in her blood sugar.  PDMP not reviewed this encounter. Orders Placed This Encounter  Procedures  . [place] LIMITED JOINT SPACE STRUCTURES LOW RIGHT    Standing Status:  Future    Number of Occurrences:   1    Standing Expiration Date:   05/14/2021    Order Specific Question:   Reason for Exam (SYMPTOM  OR  DIAGNOSIS REQUIRED)    Answer:   Bilateral knee pain    Order Specific Question:   Preferred imaging location?    Answer:   [facility]   No orders of the defined types were placed in this encounter.    Discussed warning signs or symptoms. Please see discharge instructions. Patient expresses understanding.   The above documentation has been reviewed and is accurate and complete Lynne Leader, M.D.

## 2020-05-14 NOTE — Patient Instructions (Addendum)
Thank you for coming in today. Call or go to the ER if you develop a large red swollen joint with extreme pain or oozing puss.  Let me know if you do not feel better.  Recheck with me as needed.

## 2020-06-08 ENCOUNTER — Other Ambulatory Visit: Payer: Self-pay | Admitting: Internal Medicine

## 2020-07-22 ENCOUNTER — Other Ambulatory Visit: Payer: Self-pay | Admitting: Internal Medicine

## 2020-07-22 DIAGNOSIS — E559 Vitamin D deficiency, unspecified: Secondary | ICD-10-CM

## 2020-08-22 ENCOUNTER — Encounter: Payer: Self-pay | Admitting: Adult Health

## 2020-08-22 ENCOUNTER — Ambulatory Visit (INDEPENDENT_AMBULATORY_CARE_PROVIDER_SITE_OTHER): Payer: BC Managed Care – PPO | Admitting: Adult Health

## 2020-08-22 VITALS — Ht 62.0 in | Wt 197.0 lb

## 2020-08-22 DIAGNOSIS — R0789 Other chest pain: Secondary | ICD-10-CM

## 2020-08-22 DIAGNOSIS — Z79899 Other long term (current) drug therapy: Secondary | ICD-10-CM | POA: Diagnosis not present

## 2020-08-22 NOTE — Progress Notes (Signed)
Virtual Visit via Telephone Note  I connected with Katie Morse on 08/22/20 at  4:00 PM EDT by telephone and verified that I am speaking with the correct person using two identifiers.   I discussed the limitations, risks, security and privacy concerns of performing an evaluation and management service by telephone and the availability of in person appointments. I also discussed with the patient that there may be a patient responsible charge related to this service. The patient expressed understanding and agreed to proceed.  Location patient: home Location provider: work or home office Participants present for the call: patient, provider Patient did not have a visit in the prior 7 days to address this/these issue(s).   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  She is being evaluated for 2 weeks of left shoulder/chest/upper flank pain.  She denies trauma or aggravating injury that could have caused this pain.  Pain is felt as "sharp".  Pain is constant in nature.  She does report that when she pushes on the areas that is painful to touch.  Over the last 2 weeks she has had intermittent episodes of shortness of breath when the pain gets really bad.  Pain does not radiate up her jawline or down her left arm.  Yesterday she took nitro which helped with the pain.  She is also been using Tylenol intermittently with which helps.  Additionally, she has not been taking her blood pressure medications regularly because of some confusion since the manufacturer change the color.    Observations/Objective: Patient sounds cheerful and well on the phone. I do not appreciate any SOB. Speech and thought processing are grossly intact. Patient reported vitals:  Assessment and Plan: 1. Atypical chest pain -Likely muscular in origin.  We will have her come into the office tomorrow for further evaluation and EKG.  She was advised of red flags and to go to the emergency room if she develops worsening pain, shortness of breath, or any other concerning symptoms  2. Medication management - will review medications with her tomorrow    Follow Up Instructions:    I discussed the assessment and treatment plan with the patient. The patient was provided an opportunity to ask questions and all were answered. The patient agreed with the plan and demonstrated an understanding of the instructions.   The patient was advised to call back or seek an in-person evaluation if the symptoms worsen or if the condition fails to improve as anticipated.  I provided 15 minutes of non-face-to-face time during this encounter.   Dorothyann Peng, NP

## 2020-08-23 ENCOUNTER — Ambulatory Visit: Payer: BC Managed Care – PPO | Admitting: Adult Health

## 2020-09-10 IMAGING — CT CT HEAR MORPH WITH CTA COR WITH SCORE WITH CA WITH CONTRAST AND
4 of 7 series · 8 of 20 positions shown, 9 images · IV contrast (omnipaque)
Comparison: None.
COMPARISON: None.

Addendum:
EXAM:
OVER-READ INTERPRETATION  CT CHEST

The following report is an over-read performed by radiologist Dr.
Phisana Wendoue [REDACTED] on 08/18/2019. This over-read
does not include interpretation of cardiac or coronary anatomy or
pathology. The coronary CTA interpretation by the cardiologist is
attached.
HISTORY: 53 yo AA female with diet-controlled DM, HTN and atypical chest pain
Cardiac/Coronary CTA
TECHNIQUE: The patient was scanned on a Siemens Force scanner.
PROTOCOL: A 120 kV prospective scan was triggered in the descending thoracic
aorta at 111 HU's. Axial non-contrast 3 mm slices were carried out
through the heart. The data set was analyzed on a dedicated work
station and scored using the Agatson method. Gantry rotation speed
was 250 msecs and collimation was .6 mm. Beta blockade and 0.8 mg of
sl NTG was given. The 3D data set was reconstructed in 5% intervals
of the 67-82 % of the R-R cycle. Diastolic phases were analyzed on a
dedicated work station using MPR, MIP and VRT modes. The patient
received 80mL OMNIPAQUE IOHEXOL 350 MG/ML SOLN of contrast.

[Series 6: best diast · axial · 0.39mm/px · z∈[+1122,+1160]mm · 2 of 286 slices shown, 3 images]
[im 96/286  vessel]
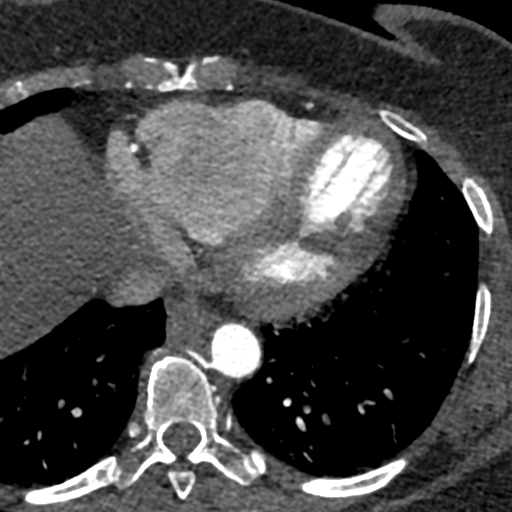
[im 96/286  lung]
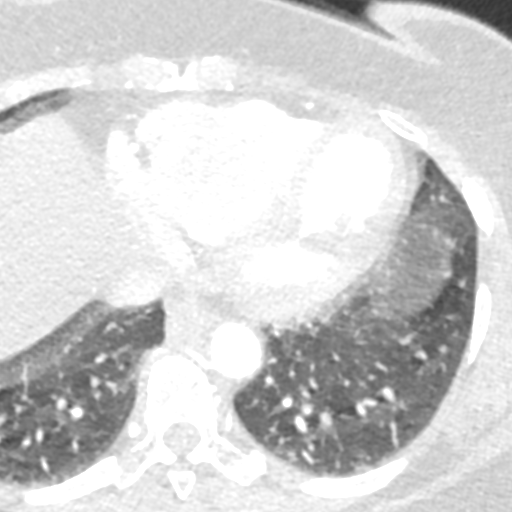
[im 191/286  vessel]
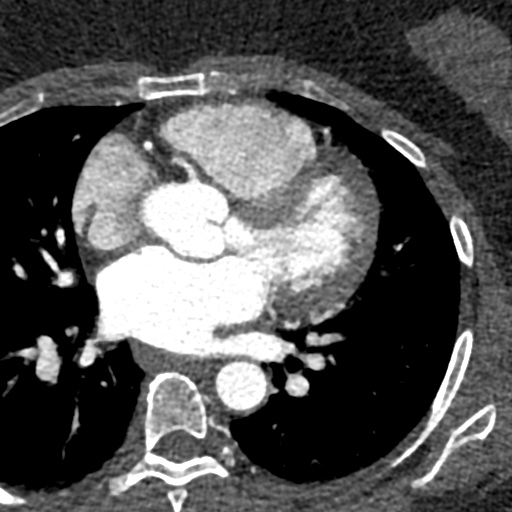

[Series 7: best syst 71 % · axial · 0.39mm/px · z∈[+1122,+1160]mm · 2 of 286 slices shown]
[im 96/286  vessel]
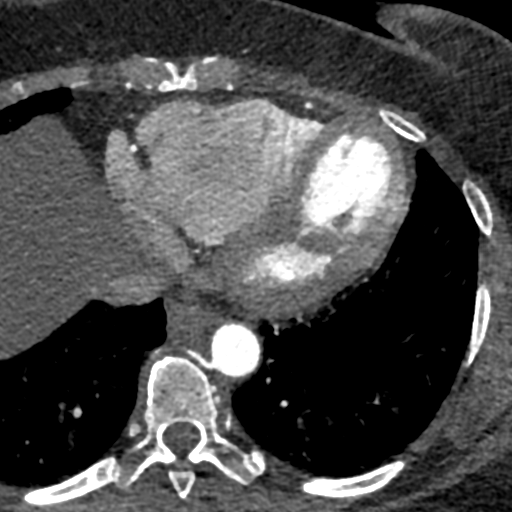
[im 191/286  vessel]
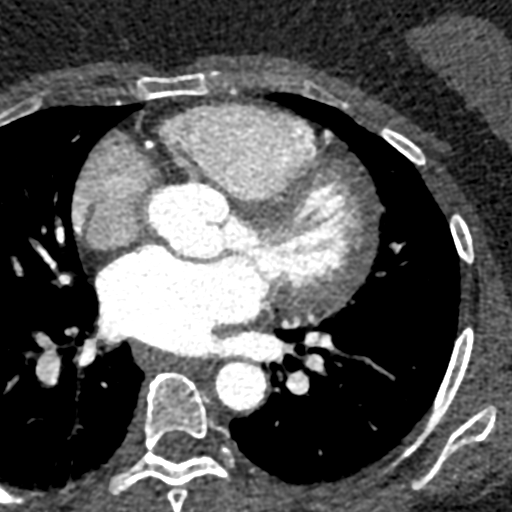

[Series 8: ts diast sharp · axial · 0.39mm/px · z∈[+1122,+1160]mm · 2 of 286 slices shown]
[im 96/286  lung]
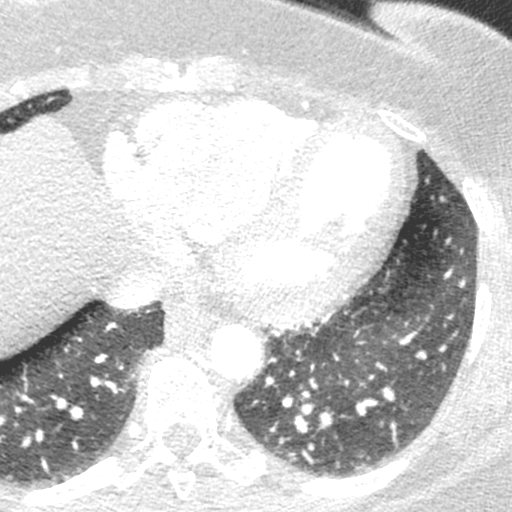
[im 191/286  lung]
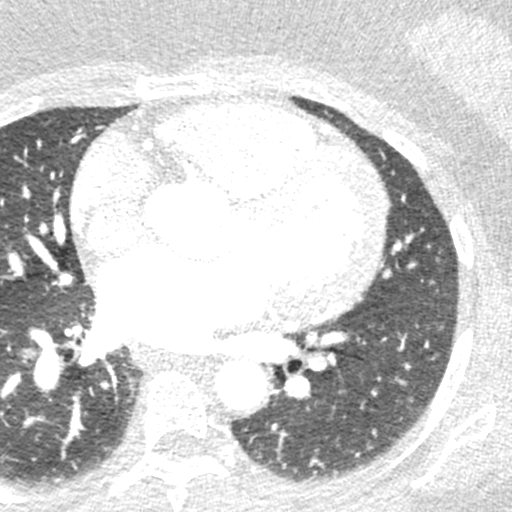

[Series 9: ts syst sharp 71 % · axial · 0.39mm/px · z∈[+1122,+1160]mm · 2 of 286 slices shown]
[im 96/286  lung]
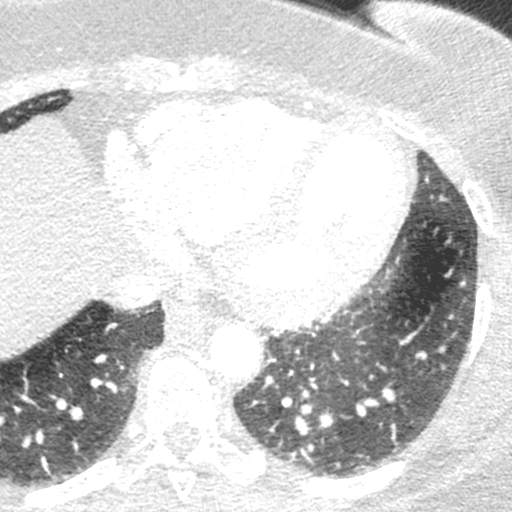
[im 191/286  lung]
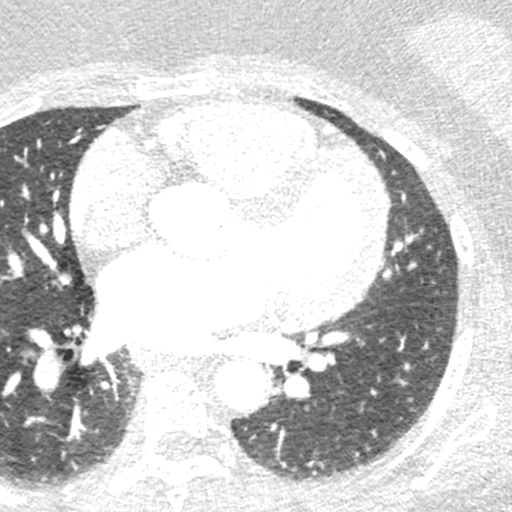

[8 of 20 positions shown; findings below may reference images not displayed]

FINDINGS: Vascular: Heart is normal size.  Visualized aorta is normal caliber.

Mediastinum/Nodes: No adenopathy in the lower mediastinum or hila.

Lungs/Pleura: Visualized lungs clear.  No effusions.

Upper Abdomen: Imaging into the upper abdomen shows no acute
findings.

Musculoskeletal: Chest wall soft tissues are unremarkable. No acute
bony abnormality.
IMPRESSION: No acute or significant extracardiac abnormality.
FINDINGS: Coronary calcium score: The patient's coronary artery calcium score
is 0, which places the patient in the 0 percentile.

Coronary arteries: Normal coronary origins.  Right dominance.

Right Coronary Artery: Dominant. Tortuous vessel without significant
disease.

Left Main Coronary Artery: Longer segment with tortuous course. Free
of disease. Bifurcates in a pitchfork fashion into the LAD, LCx and
a small ramus branch at the crux, which is non-obstructive.

Left Anterior Descending Coronary Artery: The proximal LAD makes a
sharp 90 degree bend at the ostium. There is no significant stenosis
in the vessel. There is mid-vessel intramyocardial bridging. A small
diagonal branch free of disease.

Left Circumflex Artery: The circumflex artery is a large vessel and
makes a 90 degree bend at the ostium. There is a large marginal
branch. No significant stenosis.

Aorta: Normal size, 29 mm at the mid ascending aorta (level of the
PA bifurcation) measured double oblique. No calcifications. No
dissection.

Aortic Valve: Trileaflet.  No calcifications.

Other findings:

Normal pulmonary vein drainage into the left atrium.

Normal left atrial appendage without a thrombus.

Normal size of the pulmonary artery.
IMPRESSION: 1. No evidence of CAD, CADRADS = 0. There was evidence for a mid-LAD
myocardial bridge.

2. Coronary calcium score of 0. This was 0 percentile for age and
sex matched control.

3. Normal coronary origin with right dominance.

*** End of Addendum ***
EXAM:
OVER-READ INTERPRETATION  CT CHEST

The following report is an over-read performed by radiologist Dr.
Phisana Wendoue [REDACTED] on 08/18/2019. This over-read
does not include interpretation of cardiac or coronary anatomy or
pathology. The coronary CTA interpretation by the cardiologist is
attached.
FINDINGS: Vascular: Heart is normal size.  Visualized aorta is normal caliber.

Mediastinum/Nodes: No adenopathy in the lower mediastinum or hila.

Lungs/Pleura: Visualized lungs clear.  No effusions.

Upper Abdomen: Imaging into the upper abdomen shows no acute
findings.

Musculoskeletal: Chest wall soft tissues are unremarkable. No acute
bony abnormality.
IMPRESSION: No acute or significant extracardiac abnormality.

## 2020-10-07 ENCOUNTER — Ambulatory Visit: Payer: BC Managed Care – PPO | Admitting: Family Medicine

## 2020-10-07 ENCOUNTER — Ambulatory Visit: Payer: Self-pay

## 2020-10-07 ENCOUNTER — Encounter: Payer: Self-pay | Admitting: Family Medicine

## 2020-10-07 ENCOUNTER — Other Ambulatory Visit: Payer: Self-pay

## 2020-10-07 VITALS — BP 138/80 | HR 77 | Ht 62.0 in | Wt 196.4 lb

## 2020-10-07 DIAGNOSIS — M25512 Pain in left shoulder: Secondary | ICD-10-CM | POA: Diagnosis not present

## 2020-10-07 DIAGNOSIS — G8929 Other chronic pain: Secondary | ICD-10-CM

## 2020-10-07 NOTE — Patient Instructions (Signed)
Thank you for coming in today.  Call or go to the ER if you develop a large red swollen joint with extreme pain or oozing puss.    Shoulder Impingement Syndrome Rehab Ask your health care provider which exercises are safe for you. Do exercises exactly as told by your health care provider and adjust them as directed. It is normal to feel mild stretching, pulling, tightness, or discomfort as you do these exercises. Stop right away if you feel sudden pain or your pain gets worse. Do not begin these exercises until told by your health care provider. Stretching and range-of-motion exercise This exercise warms up your muscles and joints and improves the movement and flexibility of your shoulder. This exercise also helps to relieve pain and stiffness. Passive horizontal adduction In passive adduction, you use your other hand to move the injured arm toward your body. The injured arm does not move on its own. In this movement, your arm is moved across your body in the horizontal plane (horizontal adduction). 1. Sit or stand and pull your left / right elbow across your chest, toward your other shoulder. Stop when you feel a gentle stretch in the back of your shoulder and upper arm. ? Keep your arm at shoulder height. ? Keep your arm as close to your body as you comfortably can. 2. Hold for __________ seconds. 3. Slowly return to the starting position. Repeat __________ times. Complete this exercise __________ times a day. Strengthening exercises These exercises build strength and endurance in your shoulder. Endurance is the ability to use your muscles for a long time, even after they get tired. External rotation, isometric This is an exercise in which you press the back of your wrist against a door frame without moving your shoulder joint (isometric). 1. Stand or sit in a doorway, facing the door frame. 2. Bend your left / right elbow and place the back of your wrist against the door frame. Only the back  of your wrist should be touching the frame. Keep your upper arm at your side. 3. Gently press your wrist against the door frame, as if you are trying to push your arm away from your abdomen (external rotation). Press as hard as you are able without pain. ? Avoid shrugging your shoulder while you press your wrist against the door frame. Keep your shoulder blade tucked down toward the middle of your back. 4. Hold for __________ seconds. 5. Slowly release the tension, and relax your muscles completely before you repeat the exercise. Repeat __________ times. Complete this exercise __________ times a day. Internal rotation, isometric This is an exercise in which you press your palm against a door frame without moving your shoulder joint (isometric). 1. Stand or sit in a doorway, facing the door frame. 2. Bend your left / right elbow and place the palm of your hand against the door frame. Only your palm should be touching the frame. Keep your upper arm at your side. 3. Gently press your hand against the door frame, as if you are trying to push your arm toward your abdomen (internal rotation). Press as hard as you are able without pain. ? Avoid shrugging your shoulder while you press your hand against the door frame. Keep your shoulder blade tucked down toward the middle of your back. 4. Hold for __________ seconds. 5. Slowly release the tension, and relax your muscles completely before you repeat the exercise. Repeat __________ times. Complete this exercise __________ times a day. Scapular protraction, supine  1. Lie on your back on a firm surface (supine position). Hold a __________ weight in your left / right hand. 2. Raise your left / right arm straight into the air so your hand is directly above your shoulder joint. 3. Push the weight into the air so your shoulder (scapula) lifts off the surface that you are lying on. The scapula will push up or forward (protraction). Do not move your head, neck, or  back. 4. Hold for __________ seconds. 5. Slowly return to the starting position. Let your muscles relax completely before you repeat this exercise. Repeat __________ times. Complete this exercise __________ times a day. Scapular retraction  1. Sit in a stable chair without armrests, or stand up. 2. Secure an exercise band to a stable object in front of you so the band is at shoulder height. 3. Hold one end of the exercise band in each hand. Your palms should face down. 4. Squeeze your shoulder blades together (retraction) and move your elbows slightly behind you. Do not shrug your shoulders upward while you do this. 5. Hold for __________ seconds. 6. Slowly return to the starting position. Repeat __________ times. Complete this exercise __________ times a day. Shoulder extension  1. Sit in a stable chair without armrests, or stand up. 2. Secure an exercise band to a stable object in front of you so the band is above shoulder height. 3. Hold one end of the exercise band in each hand. 4. Straighten your elbows and lift your hands up to shoulder height. 5. Squeeze your shoulder blades together and pull your hands down to the sides of your thighs (extension). Stop when your hands are straight down by your sides. Do not let your hands go behind your body. 6. Hold for __________ seconds. 7. Slowly return to the starting position. Repeat __________ times. Complete this exercise __________ times a day. This information is not intended to replace advice given to you by your health care provider. Make sure you discuss any questions you have with your health care provider. Document Revised: 03/31/2019 Document Reviewed: 01/02/2019 Elsevier Patient Education  [place].

## 2020-10-07 NOTE — Progress Notes (Signed)
I, Wendy Poet, LAT, ATC, am serving as scribe for Dr. Lynne Leader.  Katie Morse is a 54 y.o. female who presents to [place] [facility] today for L shoulder pain.  She was last seen by Dr. Georgina Snell on 05/14/20 and had B knee injections and had a prior L shoulder injection on 04/30/20.  Since her last visit, pt reports that her L shoulder pain has been flared up for the last 6 weeks.  She notes radiating pain into her L upper arm and also has some more nerve-like pain that extends into her L forearm.  She states that she has a known "pinched nerve" that is causing some of her L arm pain.  Diagnostic testing: R and L knee XR- 04/30/20; L shoulder XR- 02/22/19  Pertinent review of systems: No fevers or chills  Relevant historical information: Hypertension, diabetes   Exam:  BP 138/80 (BP Location: Left Arm, Patient Position: Sitting, Cuff Size: Large)   Pulse 77   Ht 5\' 2"  (1.575 m)   Wt 196 lb 6.4 oz (89.1 kg)   LMP 07/04/2014 Comment: GYN  SpO2 97%   BMI 35.92 kg/m  General: Well Developed, well nourished, and in no acute distress.   MSK: C-spine normal-appearing nontender midline normal cervical motion.  Number extremity strength is intact except noted below. Left shoulder normal-appearing nontender.  Range of motion abduction 120 degrees active full passive with pain. External rotation full internal rotation lumbar spine. Strength 4+/5 abduction 5/5 external/internal rotation. Positive Hawkins and Neer's test.    Lab and Radiology Results  Procedure: Real-time Ultrasound Guided Injection of left shoulder subacromial bursa Device: Philips Affiniti 50G Images permanently stored and available for review in PACS Ultrasound evaluation of shoulder prior to injection reveals moderate subacromial bursitis changes. Verbal informed consent obtained.  Discussed risks and benefits of procedure. Warned about infection bleeding damage to structures skin  hypopigmentation and fat atrophy among others. Patient expresses understanding and agreement Time-out conducted.   Noted no overlying erythema, induration, or other signs of local infection.   Skin prepped in a sterile fashion.   Local anesthesia: Topical Ethyl chloride.   With sterile technique and under real time ultrasound guidance:  40 mg of Kenalog and 2 mL of Marcaine injected into bursa. Fluid seen entering the subacromial bursa.   Completed without difficulty   Pain immediately resolved suggesting accurate placement of the medication.   Advised to call if fevers/chills, erythema, induration, drainage, or persistent bleeding.   Images permanently stored and available for review in the ultrasound unit.  Impression: Technically successful ultrasound guided injection.    Assessment and Plan: 54 y.o. female with left shoulder pain due to subacromial bursitis.  Plan for repeat injection today as last injection was Apr 30, 2020.  Plan to continue home exercise program.  Consider physical therapy and/or MRI if not improving.  Recheck as needed.    Orders Placed This Encounter  Procedures  . [place] LIMITED JOINT SPACE STRUCTURES UP LEFT(NO LINKED CHARGES)    Order Specific Question:   Reason for Exam (SYMPTOM  OR DIAGNOSIS REQUIRED)    Answer:   L shoulder pain    Order Specific Question:   Preferred imaging location?    Answer:   [facility]   No orders of the defined types were placed in this encounter.    Discussed warning signs or symptoms. Please see discharge instructions. Patient expresses understanding.   The above documentation has been  reviewed and is accurate and complete Lynne Leader, M.D.

## 2021-01-13 ENCOUNTER — Telehealth: Payer: Self-pay | Admitting: Internal Medicine

## 2021-01-13 NOTE — Telephone Encounter (Signed)
Pt call and stated she have covid and want to know if dr. Jerilee Hoh could call her something in for her at  [id number] - [place], [place] Phone:  [phone]  Fax:  [phone]

## 2021-01-14 NOTE — Telephone Encounter (Signed)
Virtual visit scheduled.  

## 2021-01-15 ENCOUNTER — Encounter: Payer: Self-pay | Admitting: Internal Medicine

## 2021-01-15 ENCOUNTER — Other Ambulatory Visit: Payer: Self-pay

## 2021-01-15 ENCOUNTER — Telehealth (INDEPENDENT_AMBULATORY_CARE_PROVIDER_SITE_OTHER): Payer: BC Managed Care – PPO | Admitting: Internal Medicine

## 2021-01-15 VITALS — Temp 97.6°F | Wt 192.0 lb

## 2021-01-15 DIAGNOSIS — U071 COVID-19: Secondary | ICD-10-CM

## 2021-01-15 NOTE — Progress Notes (Signed)
Virtual Visit via Telephone Note  I connected with Katie Morse on 01/15/21 at  2:30 PM EST by telephone and verified that I am speaking with the correct person using two identifiers.   I discussed the limitations, risks, security and privacy concerns of performing an evaluation and management service by telephone and the availability of in person appointments. I also discussed with the patient that there may be a patient responsible charge related to this service. The patient expressed understanding and agreed to proceed.  Location patient: home Location provider: work office Participants present for the call: patient, provider Patient did not have a visit in the prior 7 days to address this/these issue(s).   History of Present Illness:  She tested positive for Covid on January 23 after 3 days of symptoms.  Her symptoms were congestion, runny nose and hoarse voice.  She believes her husband brought it home from work.  Her 2 children are also positive.  She does not have any significant shortness of breath, fever or body aches.  She does not have a pulse oximeter.   Observations/Objective: Patient sounds cheerful and well on the phone. I do not appreciate any increased work of breathing. Speech and thought processing are grossly intact. Patient reported vitals: None reported   Current Outpatient Medications:  .  albuterol (VENTOLIN HFA) 108 (90 Base) MCG/ACT inhaler, Inhale into the lungs every 6 (six) hours as needed for wheezing or shortness of breath., Disp: , Rfl:  .  aspirin EC 81 MG tablet, Take 1 tablet (81 mg total) by mouth daily., Disp: 90 tablet, Rfl: 3 .  atorvastatin (LIPITOR) 40 MG tablet, TAKE 1 TABLET BY MOUTH EVERY DAY, Disp: 30 tablet, Rfl: 3 .  Cranberry 1000 MG CAPS, Take 1 capsule by mouth daily. , Disp: , Rfl:  .  Diclofenac Sodium 1 % CREA, Apply a small amount to affected area once daily, Disp: 120 g, Rfl: 1 .  HYDROcodone-acetaminophen  (NORCO/VICODIN) 5-325 MG tablet, Take 1 tablet by mouth every 6 (six) hours as needed., Disp: 15 tablet, Rfl: 0 .  hydrocortisone 2.5 % cream, Apply topically 2 (two) times daily., Disp: 30 g, Rfl: 0 .  losartan (COZAAR) 100 MG tablet, TAKE 1 TABLET BY MOUTH EVERY DAY, Disp: 90 tablet, Rfl: 1 .  metFORMIN (GLUCOPHAGE) 500 MG tablet, TAKE 1 TABLET (500 MG TOTAL) BY MOUTH 2 (TWO) TIMES DAILY WITH A MEAL., Disp: 180 tablet, Rfl: 1 .  metoprolol tartrate (LOPRESSOR) 25 MG tablet, TAKE 1 TABLET BY MOUTH TWICE A DAY, Disp: 180 tablet, Rfl: 1 .  Multiple Vitamins-Minerals (HAIR/SKIN/NAILS PO), Take 2 each by mouth daily., Disp: , Rfl:  .  nitroGLYCERIN (NITROSTAT) 0.4 MG SL tablet, Place 0.4 mg under the tongue every 5 (five) minutes as needed for chest pain., Disp: , Rfl:  .  vitamin B-12 (CYANOCOBALAMIN) 100 MCG tablet, Take 50 mcg by mouth daily., Disp: , Rfl:  .  vitamin C (ASCORBIC ACID) 500 MG tablet, Take 500 mg by mouth daily., Disp: , Rfl:  .  vitamin E 100 UNIT capsule, Take 100 Units by mouth daily., Disp: , Rfl:   Review of Systems:  Constitutional: Denies fever, chills, diaphoresis, appetite change and fatigue.  HEENT: Denies photophobia, eye pain, redness, hearing loss, ear pain,  mouth sores, trouble swallowing, neck pain, neck stiffness and tinnitus.   Respiratory: Denies SOB, DOE, cough, chest tightness,  and wheezing.   Cardiovascular: Denies chest pain, palpitations and leg swelling.  Gastrointestinal: Denies  nausea, vomiting, abdominal pain, diarrhea, constipation, blood in stool and abdominal distention.  Genitourinary: Denies dysuria, urgency, frequency, hematuria, flank pain and difficulty urinating.  Endocrine: Denies: hot or cold intolerance, sweats, changes in hair or nails, polyuria, polydipsia. Musculoskeletal: Denies myalgias, back pain, joint swelling, arthralgias and gait problem.  Skin: Denies pallor, rash and wound.  Neurological: Denies dizziness, seizures, syncope,  weakness, light-headedness, numbness and headaches.  Hematological: Denies adenopathy. Easy bruising, personal or family bleeding history  Psychiatric/Behavioral: Denies suicidal ideation, mood changes, confusion, nervousness, sleep disturbance and agitation   Assessment and Plan:  COVID-19 -Have advised symptomatic management at home with over-the-counter pain relievers, cough suppressants, antihistamines. -If significant shortness of breath have advised immediate ED visit. -Strict isolation for 10 days from positive test, work note has been provided.   I discussed the assessment and treatment plan with the patient. The patient was provided an opportunity to ask questions and all were answered. The patient agreed with the plan and demonstrated an understanding of the instructions.   The patient was advised to call back or seek an in-person evaluation if the symptoms worsen or if the condition fails to improve as anticipated.  I provided 15 minutes of non-face-to-face time during this encounter.   Lelon Frohlich, MD [facility] at [facility]

## 2021-01-30 ENCOUNTER — Other Ambulatory Visit: Payer: Self-pay | Admitting: Internal Medicine

## 2021-01-30 NOTE — Telephone Encounter (Signed)
Patient states that she is still very congested.  She would like Dr. Jerilee Hoh to refill her inhaler.  Please advise.

## 2021-01-31 MED ORDER — ALBUTEROL SULFATE HFA 108 (90 BASE) MCG/ACT IN AERS: 1.0000 | INHALATION_SPRAY | Freq: Four times a day (QID) | RESPIRATORY_TRACT | 3 refills | Status: DC | PRN

## 2021-07-03 LAB — HM PAP SMEAR

## 2021-07-09 ENCOUNTER — Telehealth: Payer: Self-pay | Admitting: Internal Medicine

## 2021-07-09 NOTE — Telephone Encounter (Signed)
Pt is calling in to get a refill on Rx albuterol (VENTOLIN HFA) 108 pt stated that she is out and has an appointment on 08/12/2021 for her CPE  [facility] [address] [facility] [place]

## 2021-07-10 MED ORDER — ALBUTEROL SULFATE HFA 108 (90 BASE) MCG/ACT IN AERS: 1.0000 | INHALATION_SPRAY | Freq: Four times a day (QID) | RESPIRATORY_TRACT | 3 refills | Status: DC | PRN

## 2021-07-10 NOTE — Telephone Encounter (Signed)
Refill sent.

## 2021-08-11 ENCOUNTER — Other Ambulatory Visit: Payer: Self-pay

## 2021-08-12 ENCOUNTER — Encounter: Payer: Self-pay | Admitting: Internal Medicine

## 2021-08-12 ENCOUNTER — Ambulatory Visit (INDEPENDENT_AMBULATORY_CARE_PROVIDER_SITE_OTHER): Payer: BC Managed Care – PPO | Admitting: Internal Medicine

## 2021-08-12 VITALS — BP 140/90 | HR 78 | Temp 98.6°F | Ht 62.0 in | Wt 190.7 lb

## 2021-08-12 DIAGNOSIS — E559 Vitamin D deficiency, unspecified: Secondary | ICD-10-CM | POA: Diagnosis not present

## 2021-08-12 DIAGNOSIS — E1169 Type 2 diabetes mellitus with other specified complication: Secondary | ICD-10-CM | POA: Diagnosis not present

## 2021-08-12 DIAGNOSIS — Z23 Encounter for immunization: Secondary | ICD-10-CM

## 2021-08-12 DIAGNOSIS — I10 Essential (primary) hypertension: Secondary | ICD-10-CM

## 2021-08-12 DIAGNOSIS — E785 Hyperlipidemia, unspecified: Secondary | ICD-10-CM

## 2021-08-12 DIAGNOSIS — E119 Type 2 diabetes mellitus without complications: Secondary | ICD-10-CM

## 2021-08-12 DIAGNOSIS — Z0000 Encounter for general adult medical examination without abnormal findings: Secondary | ICD-10-CM | POA: Diagnosis not present

## 2021-08-12 LAB — CBC WITH DIFFERENTIAL/PLATELET
Basophils Absolute: 0 10*3/uL (ref 0.0–0.1)
Basophils Relative: 0.6 % (ref 0.0–3.0)
Eosinophils Absolute: 0.1 10*3/uL (ref 0.0–0.7)
Eosinophils Relative: 2.5 % (ref 0.0–5.0)
HCT: 37.8 % (ref 36.0–46.0)
Hemoglobin: 12.7 g/dL (ref 12.0–15.0)
Lymphocytes Relative: 29 % (ref 12.0–46.0)
Lymphs Abs: 1.5 10*3/uL (ref 0.7–4.0)
MCHC: 33.5 g/dL (ref 30.0–36.0)
MCV: 80.7 fl (ref 78.0–100.0)
Monocytes Absolute: 0.4 10*3/uL (ref 0.1–1.0)
Monocytes Relative: 7.7 % (ref 3.0–12.0)
Neutro Abs: 3.1 10*3/uL (ref 1.4–7.7)
Neutrophils Relative %: 60.2 % (ref 43.0–77.0)
Platelets: 261 10*3/uL (ref 150.0–400.0)
RBC: 4.68 Mil/uL (ref 3.87–5.11)
RDW: 14.6 % (ref 11.5–15.5)
WBC: 5.1 10*3/uL (ref 4.0–10.5)

## 2021-08-12 LAB — VITAMIN D 25 HYDROXY (VIT D DEFICIENCY, FRACTURES): VITD: 23.41 ng/mL — ABNORMAL LOW (ref 30.00–100.00)

## 2021-08-12 LAB — COMPREHENSIVE METABOLIC PANEL
ALT: 15 U/L (ref 0–35)
AST: 13 U/L (ref 0–37)
Albumin: 4.2 g/dL (ref 3.5–5.2)
Alkaline Phosphatase: 79 U/L (ref 39–117)
BUN: 11 mg/dL (ref 6–23)
CO2: 26 mEq/L (ref 19–32)
Calcium: 10 mg/dL (ref 8.4–10.5)
Chloride: 104 mEq/L (ref 96–112)
Creatinine, Ser: 0.63 mg/dL (ref 0.40–1.20)
GFR: 99.27 mL/min (ref >60.00)
Glucose, Bld: 111 mg/dL — ABNORMAL HIGH (ref 70–99)
Potassium: 4 mEq/L (ref 3.5–5.1)
Sodium: 140 mEq/L (ref 135–145)
Total Bilirubin: 0.8 mg/dL (ref 0.2–1.2)
Total Protein: 8.1 g/dL (ref 6.0–8.3)

## 2021-08-12 LAB — LIPID PANEL
Cholesterol: 170 mg/dL (ref 0–200)
HDL: 69.1 mg/dL (ref >39.00)
LDL Cholesterol: 90 mg/dL (ref 0–99)
NonHDL: 100.78
Total CHOL/HDL Ratio: 2
Triglycerides: 53 mg/dL (ref 0.0–149.0)
VLDL: 10.6 mg/dL (ref 0.0–40.0)

## 2021-08-12 LAB — VITAMIN B12: Vitamin B-12: 181 pg/mL — ABNORMAL LOW (ref 211–911)

## 2021-08-12 LAB — TSH: TSH: 0.75 u[IU]/mL (ref 0.35–5.50)

## 2021-08-12 LAB — HEMOGLOBIN A1C: Hgb A1c MFr Bld: 7.6 % — ABNORMAL HIGH (ref 4.6–6.5)

## 2021-08-12 MED ORDER — ATORVASTATIN CALCIUM 40 MG PO TABS: 40.0000 mg | ORAL_TABLET | Freq: Every day | ORAL | 1 refills | Status: DC

## 2021-08-12 MED ORDER — NITROGLYCERIN 0.4 MG SL SUBL: 0.4000 mg | SUBLINGUAL_TABLET | SUBLINGUAL | 2 refills | Status: DC | PRN

## 2021-08-12 MED ORDER — METOPROLOL TARTRATE 25 MG PO TABS: 25.0000 mg | ORAL_TABLET | Freq: Two times a day (BID) | ORAL | 1 refills | Status: DC

## 2021-08-12 MED ORDER — DICLOFENAC SODIUM 75 MG PO TBEC: 75.0000 mg | DELAYED_RELEASE_TABLET | Freq: Two times a day (BID) | ORAL | 0 refills | Status: DC

## 2021-08-12 MED ORDER — LOSARTAN POTASSIUM 100 MG PO TABS: 100.0000 mg | ORAL_TABLET | Freq: Every day | ORAL | 1 refills | Status: DC

## 2021-08-12 MED ORDER — HYDROCORTISONE 2.5 % EX CREA: TOPICAL_CREAM | Freq: Two times a day (BID) | CUTANEOUS | 0 refills | Status: DC

## 2021-08-12 NOTE — Patient Instructions (Signed)
-  Nice seeing you today!!  -Lab work today; will notify you once results are available.  -Final shingles vaccine today.  -Schedule follow up in 3 months.

## 2021-08-12 NOTE — Progress Notes (Signed)
Established Patient Office Visit     This visit occurred during the SARS-CoV-2 public health emergency.  Safety protocols were in place, including screening questions prior to the visit, additional usage of staff PPE, and extensive cleaning of exam room while observing appropriate contact time as indicated for disinfecting solutions.    CC/Reason for Visit: Annual preventive exam  HPI: Katie Morse is a 55 y.o. female who is coming in today for the above mentioned reasons. Past Medical History is significant for: Obesity, type 2 diabetes that has been well controlled in the past, hypertension, hyperlipidemia, vitamin D deficiency.  She unfortunately lost her insurance and is not sure when she will be able to return for care.   She is due to get her second COVID booster in November.  She is overdue for mammogram.  She had a colonoscopy in 2017.  She had a Pap smear with Dr. Letta Pate in July.  She did not take her blood pressure medication today or yesterday.   Past Medical/Surgical History: Past Medical History:  Diagnosis Date   ALLERGIC RHINITIS 08/20/2008   Allergy    Anemia    Blood transfusion without reported diagnosis 2011   post surgery   Diabetes mellitus without complication ([place])    DYSPNEA 11/12/2008   Headache(784.0) 08/20/2008   HYPERTENSION 08/20/2008   LEIOMYOMA, UTERUS 08/20/2008   PALPITATIONS, RECURRENT 11/12/2008   Sickle-cell trait ([place]) [id number]   SYSTOLIC MURMUR [id number]    Past Surgical History:  Procedure Laterality Date   LIPOMA EXCISION Left 07/19/2019   Procedure: EXCISION OF SUBCUTANEOUS LIPOMA LEFT BUTTOCK;  Surgeon: Donnie Mesa, MD;  Location: [facility];  Service: General;  Laterality: Left;   MYOMECTOMY     fibroids    Social History:  reports that she has never smoked. She has never used smokeless tobacco. She reports current alcohol use. She reports that she does not use drugs.  Allergies: Allergies   Allergen Reactions   Latex    Metformin Hcl Other (See Comments)    Family History:  Family History  Problem Relation Age of Onset   Hypertension Mother    Heart Problems Mother    Cancer Father        lung and prostate ca   Diabetes Other    Breast cancer Cousin        2-twins   Colon cancer Neg Hx      Current Outpatient Medications:    albuterol (VENTOLIN HFA) 108 (90 Base) MCG/ACT inhaler, Inhale 1 puff into the lungs every 6 (six) hours as needed for wheezing or shortness of breath., Disp: 1 each, Rfl: 3   aspirin EC 81 MG tablet, Take 1 tablet (81 mg total) by mouth daily., Disp: 90 tablet, Rfl: 3   Cranberry 1000 MG CAPS, Take 1 capsule by mouth daily. , Disp: , Rfl:    diclofenac (VOLTAREN) 75 MG EC tablet, Take 1 tablet (75 mg total) by mouth 2 (two) times daily., Disp: 30 tablet, Rfl: 0   Diclofenac Sodium 1 % CREA, Apply a small amount to affected area once daily, Disp: 120 g, Rfl: 1   HYDROcodone-acetaminophen (NORCO/VICODIN) 5-325 MG tablet, Take 1 tablet by mouth every 6 (six) hours as needed., Disp: 15 tablet, Rfl: 0   metFORMIN (GLUCOPHAGE) 500 MG tablet, TAKE 1 TABLET (500 MG TOTAL) BY MOUTH 2 (TWO) TIMES DAILY WITH A MEAL., Disp: 180 tablet, Rfl: 1   Multiple Vitamins-Minerals (HAIR/SKIN/NAILS PO), Take 2 each by  mouth daily., Disp: , Rfl:    vitamin B-12 (CYANOCOBALAMIN) 100 MCG tablet, Take 50 mcg by mouth daily., Disp: , Rfl:    vitamin C (ASCORBIC ACID) 500 MG tablet, Take 500 mg by mouth daily., Disp: , Rfl:    vitamin E 100 UNIT capsule, Take 100 Units by mouth daily., Disp: , Rfl:    atorvastatin (LIPITOR) 40 MG tablet, Take 1 tablet (40 mg total) by mouth daily., Disp: 90 tablet, Rfl: 1   hydrocortisone 2.5 % cream, Apply topically 2 (two) times daily., Disp: 30 g, Rfl: 0   losartan (COZAAR) 100 MG tablet, Take 1 tablet (100 mg total) by mouth daily., Disp: 90 tablet, Rfl: 1   metoprolol tartrate (LOPRESSOR) 25 MG tablet, Take 1 tablet (25 mg total) by  mouth 2 (two) times daily., Disp: 180 tablet, Rfl: 1   nitroGLYCERIN (NITROSTAT) 0.4 MG SL tablet, Place 1 tablet (0.4 mg total) under the tongue every 5 (five) minutes as needed for chest pain., Disp: 30 tablet, Rfl: 2  Review of Systems:  Constitutional: Denies fever, chills, diaphoresis, appetite change and fatigue.  HEENT: Denies photophobia, eye pain, redness, hearing loss, ear pain, congestion, sore throat, rhinorrhea, sneezing, mouth sores, trouble swallowing, neck pain, neck stiffness and tinnitus.   Respiratory: Denies SOB, DOE, cough, chest tightness,  and wheezing.   Cardiovascular: Denies chest pain, palpitations and leg swelling.  Gastrointestinal: Denies nausea, vomiting, abdominal pain, diarrhea, constipation, blood in stool and abdominal distention.  Genitourinary: Denies dysuria, urgency, frequency, hematuria, flank pain and difficulty urinating.  Endocrine: Denies: hot or cold intolerance, sweats, changes in hair or nails, polyuria, polydipsia. Musculoskeletal: Denies myalgias, back pain, joint swelling, arthralgias and gait problem.  Skin: Denies pallor, rash and wound.  Neurological: Denies dizziness, seizures, syncope, weakness, light-headedness, numbness and headaches.  Hematological: Denies adenopathy. Easy bruising, personal or family bleeding history  Psychiatric/Behavioral: Denies suicidal ideation, mood changes, confusion, nervousness, sleep disturbance and agitation    Physical Exam: Vitals:   08/12/21 1117  BP: 140/90  Pulse: 78  Temp: 98.6 F (37 C)  TempSrc: Oral  SpO2: 98%  Weight: 190 lb 11.2 oz (86.5 kg)  Height: '5\' 2"'$  (1.575 m)    Body mass index is 34.88 kg/m.   Constitutional: NAD, calm, comfortable Eyes: PERRL, lids and conjunctivae normal ENMT: Mucous membranes are moist. Posterior pharynx clear of any exudate or lesions. Normal dentition. Tympanic membrane is pearly white, no erythema or bulging. Neck: normal, supple, no masses, no  thyromegaly Respiratory: clear to auscultation bilaterally, no wheezing, no crackles. Normal respiratory effort. No accessory muscle use.  Cardiovascular: Regular rate and rhythm, no murmurs / rubs / gallops. No extremity edema. 2+ pedal pulses. No carotid bruits.  Abdomen: no tenderness, no masses palpated. No hepatosplenomegaly. Bowel sounds positive.  Musculoskeletal: no clubbing / cyanosis. No joint deformity upper and lower extremities. Good ROM, no contractures. Normal muscle tone.  Skin: no rashes, lesions, ulcers. No induration Neurologic: CN 2-12 grossly intact. Sensation intact, DTR normal. Strength 5/5 in all 4.  Psychiatric: Normal judgment and insight. Alert and oriented x 3. Normal mood.    Impression and Plan:  Encounter for preventive health examination -Advised routine eye and dental care. -Second shingles vaccine today, she is due to get her second COVID booster in November and flu vaccine once available. -Healthy lifestyle discussed in detail. -Screening labs today. -Sent for mammogram today. -She had a colonoscopy in 2017 and is a 10-year callback. -She had a negative Pap smear in  July 2022.  Vitamin D deficiency  - Plan: VITAMIN D 25 Hydroxy (Vit-D Deficiency, Fractures)  Hyperlipidemia associated with type 2 diabetes mellitus (HCC) -Check lipids today, she is on atorvastatin daily.  Essential hypertension -Not well controlled, suspect due to lack of medication in past couple days. -Continue losartan 100 mg daily, metoprolol 25 mg twice daily. -Return in 3 months for follow-up.  Morbid obesity ([place])  -Discussed healthy lifestyle, including increased physical activity and better food choices to promote weight loss.  Controlled type 2 diabetes mellitus without complication, without long-term current use of insulin (HCC) -Check A1c, continue metformin.  Need for shingles vaccine -Second shingles vaccine administered today.    Patient Instructions  -Nice  seeing you today!!  -Lab work today; will notify you once results are available.  -Final shingles vaccine today.  -Schedule follow up in 3 months.    Lelon Frohlich, MD [facility]

## 2021-08-12 NOTE — Addendum Note (Signed)
Addended by: Amanda Cockayne on: 08/12/2021 12:00 PM   Modules accepted: Orders

## 2021-08-13 ENCOUNTER — Encounter: Payer: Self-pay | Admitting: Internal Medicine

## 2021-08-13 ENCOUNTER — Other Ambulatory Visit: Payer: Self-pay | Admitting: Internal Medicine

## 2021-08-13 DIAGNOSIS — E538 Deficiency of other specified B group vitamins: Secondary | ICD-10-CM | POA: Insufficient documentation

## 2021-08-13 DIAGNOSIS — E559 Vitamin D deficiency, unspecified: Secondary | ICD-10-CM

## 2021-08-13 MED ORDER — METFORMIN HCL 1000 MG PO TABS: 1000.0000 mg | ORAL_TABLET | Freq: Two times a day (BID) | ORAL | 1 refills | Status: DC

## 2021-08-13 MED ORDER — ATORVASTATIN CALCIUM 80 MG PO TABS: 80.0000 mg | ORAL_TABLET | Freq: Every day | ORAL | 1 refills | Status: DC

## 2021-08-13 MED ORDER — VITAMIN D (ERGOCALCIFEROL) 1.25 MG (50000 UNIT) PO CAPS
50000.0000 [IU] | ORAL_CAPSULE | ORAL | 0 refills | Status: AC
Start: 2021-08-13 — End: 2021-10-30

## 2021-08-22 ENCOUNTER — Other Ambulatory Visit: Payer: Self-pay

## 2021-08-22 ENCOUNTER — Ambulatory Visit (INDEPENDENT_AMBULATORY_CARE_PROVIDER_SITE_OTHER): Payer: BC Managed Care – PPO

## 2021-08-22 DIAGNOSIS — E538 Deficiency of other specified B group vitamins: Secondary | ICD-10-CM | POA: Diagnosis not present

## 2021-08-22 MED ORDER — CYANOCOBALAMIN 1000 MCG/ML IJ SOLN
1000.0000 ug | Freq: Once | INTRAMUSCULAR | Status: AC
  Administered 2021-08-22: 1000 ug via INTRAMUSCULAR

## 2021-08-22 NOTE — Progress Notes (Signed)
Per orders of Dr. Hernandez, injection of Cyanocobalamin 1000 mcg given by Atilla Zollner L Anterrio Mccleery. Patient tolerated injection well.  

## 2021-08-29 ENCOUNTER — Ambulatory Visit (INDEPENDENT_AMBULATORY_CARE_PROVIDER_SITE_OTHER): Payer: BC Managed Care – PPO

## 2021-08-29 ENCOUNTER — Other Ambulatory Visit: Payer: Self-pay

## 2021-08-29 DIAGNOSIS — E538 Deficiency of other specified B group vitamins: Secondary | ICD-10-CM

## 2021-08-29 MED ORDER — CYANOCOBALAMIN 1000 MCG/ML IJ SOLN
1000.0000 ug | Freq: Once | INTRAMUSCULAR | Status: AC
  Administered 2021-08-29: 1000 ug via INTRAMUSCULAR

## 2021-08-29 NOTE — Progress Notes (Signed)
Per orders of Isaac Bliss, Rayford Halsted, MD, injection of B12 given in left deltoid by Franco Collet. Patient tolerated injection well.  Lab Results  Component Value Date   VITAMINB12 181 (L) 08/12/2021

## 2021-08-29 NOTE — Patient Instructions (Signed)
Health Maintenance Due  Topic Date Due   OPHTHALMOLOGY EXAM  Never done   HIV Screening  Never done   Hepatitis C Screening  Never done   FOOT EXAM  08/18/2019    Depression screen [facility] 2/9 08/12/2021 01/15/2021 02/17/2019  Decreased Interest 0 0 0  Down, Depressed, Hopeless 1 0 0  PHQ - 2 Score 1 0 0  Altered sleeping 3 1 -  Tired, decreased energy 3 2 -  Change in appetite 0 0 -  Feeling bad or failure about yourself  0 0 -  Trouble concentrating 0 0 -  Moving slowly or fidgety/restless 0 0 -  Suicidal thoughts 0 0 -  PHQ-9 Score 7 3 -  Difficult doing work/chores Not difficult at all Not difficult at all -

## 2021-09-05 ENCOUNTER — Other Ambulatory Visit: Payer: Self-pay

## 2021-09-05 ENCOUNTER — Ambulatory Visit (INDEPENDENT_AMBULATORY_CARE_PROVIDER_SITE_OTHER): Payer: BC Managed Care – PPO

## 2021-09-05 DIAGNOSIS — E538 Deficiency of other specified B group vitamins: Secondary | ICD-10-CM

## 2021-09-05 MED ORDER — CYANOCOBALAMIN 1000 MCG/ML IJ SOLN
1000.0000 ug | Freq: Once | INTRAMUSCULAR | Status: AC
  Administered 2021-09-05: 1000 ug via INTRAMUSCULAR

## 2021-09-05 NOTE — Progress Notes (Signed)
Per orders of Dr. Fry, injection of Cyanocobalamin 1000 mcg given by Anner Baity L Jazmin Ley. °Patient tolerated injection well.  °

## 2021-09-06 ENCOUNTER — Other Ambulatory Visit: Payer: Self-pay | Admitting: Internal Medicine

## 2021-09-12 ENCOUNTER — Ambulatory Visit (INDEPENDENT_AMBULATORY_CARE_PROVIDER_SITE_OTHER): Payer: BC Managed Care – PPO

## 2021-09-12 ENCOUNTER — Other Ambulatory Visit: Payer: Self-pay

## 2021-09-12 DIAGNOSIS — E538 Deficiency of other specified B group vitamins: Secondary | ICD-10-CM | POA: Diagnosis not present

## 2021-09-12 MED ORDER — CYANOCOBALAMIN 1000 MCG/ML IJ SOLN
1000.0000 ug | Freq: Once | INTRAMUSCULAR | Status: AC
  Administered 2021-09-12: 1000 ug via INTRAMUSCULAR

## 2021-09-12 NOTE — Progress Notes (Signed)
Per orders of Dr. Hernandez, injection of B12 given by Dairon Procter. Patient tolerated injection well.  

## 2021-10-07 ENCOUNTER — Telehealth: Payer: Self-pay

## 2021-10-07 NOTE — Telephone Encounter (Signed)
Caller states she is coughing up mucus and the color is yellow. Also has asthma. patient is having trouble breathing and wheezing. Caller states that she feels a little warmer than normal. Has been using inhaler for the past 2 months. Disp. Time Eilene Ghazi Time) Disposition Final User 10/04/2021 7:20:15 PM Send to Urgent Sima Matas, [place] 10/04/2021 7:34:26 PM 911 Outcome Documentation Clydene Laming, RN, Yancey Flemings Reason: Refused to call; stating that she just vomited on herself after using the inhaler and that she doesn't have insurance. May drive instead. 10/04/2021 7:25:42 PM Call EMS 911 Now Yes Clydene Laming, RN, Ray Disagree/Comply Disagree Caller Understands Yes PreDisposition InappropriateToAsk  Pt states she did not go to ER or UC due to financial constraints. She has been having chest congestion (coughing on phone) x 3days. Has not tested for Covid. States mucous is clear now. Pt states she is feeling better. Offered virtual visit which she accepted. PCP no availability this week. Set up with alternate provider at 1st available on 10/08/21. Mychart activation code & instructions sent to pt via text message.

## 2021-10-08 ENCOUNTER — Telehealth (INDEPENDENT_AMBULATORY_CARE_PROVIDER_SITE_OTHER): Payer: BC Managed Care – PPO | Admitting: Family Medicine

## 2021-10-08 DIAGNOSIS — R062 Wheezing: Secondary | ICD-10-CM | POA: Diagnosis not present

## 2021-10-08 DIAGNOSIS — R059 Cough, unspecified: Secondary | ICD-10-CM | POA: Diagnosis not present

## 2021-10-08 MED ORDER — PREDNISONE 20 MG PO TABS: ORAL_TABLET | ORAL | 0 refills | Status: DC

## 2021-10-08 MED ORDER — AMOXICILLIN-POT CLAVULANATE 875-125 MG PO TABS: 1.0000 | ORAL_TABLET | Freq: Two times a day (BID) | ORAL | 0 refills | Status: DC

## 2021-10-08 NOTE — Progress Notes (Signed)
Patient ID: Katie Morse, female   DOB: 07/15/1966, 55 y.o.   MRN: [id number]  This visit type was conducted due to national recommendations for restrictions regarding the COVID-19 pandemic in an effort to limit this patient's exposure and mitigate transmission in our community.   Virtual Visit via Video Note  I connected with Katie Morse on 10/08/21 at 10:00 AM EDT by a video enabled telemedicine application and verified that I am speaking with the correct person using two identifiers.  Location patient: home Location provider:work or home office Persons participating in the virtual visit: patient, provider  I discussed the limitations of evaluation and management by telemedicine and the availability of in person appointments. The patient expressed understanding and agreed to proceed.   HPI:  Patient relates onset of illness with upper respiratory symptoms last Friday.  She developed some nasal and chest congestion.  This seemed to progress over the weekend.  She has had some cough productive of thick yellow sputum.  No fever.  Intermittent wheezing.  Using albuterol inhaler with mild improvement.  Some increased malaise.  She states she has had difficulty getting over respiratory infections in the past.  Chronic problems include hypertension, hyperlipidemia, type 2 diabetes.  She states she has history of asthma in the past but this is not on her problem list and she does not take any regular inhalers.  Non-smoker.   ROS: See pertinent positives and negatives per HPI.  Past Medical History:  Diagnosis Date   ALLERGIC RHINITIS 08/20/2008   Allergy    Anemia    Blood transfusion without reported diagnosis 2011   post surgery   Diabetes mellitus without complication ([place])    DYSPNEA 11/12/2008   Headache(784.0) 08/20/2008   HYPERTENSION 08/20/2008   LEIOMYOMA, UTERUS 08/20/2008   PALPITATIONS, RECURRENT 11/12/2008   Sickle-cell trait ([place]) 7/84/6962   SYSTOLIC  MURMUR 95/28/4132    Past Surgical History:  Procedure Laterality Date   LIPOMA EXCISION Left 07/19/2019   Procedure: EXCISION OF SUBCUTANEOUS LIPOMA LEFT BUTTOCK;  Surgeon: Donnie Mesa, MD;  Location: [facility];  Service: General;  Laterality: Left;   MYOMECTOMY     fibroids    Family History  Problem Relation Age of Onset   Hypertension Mother    Heart Problems Mother    Cancer Father        lung and prostate ca   Diabetes Other    Breast cancer Cousin        2-twins   Colon cancer Neg Hx     SOCIAL HX: Non-smoker   Current Outpatient Medications:    albuterol (VENTOLIN HFA) 108 (90 Base) MCG/ACT inhaler, Inhale 1 puff into the lungs every 6 (six) hours as needed for wheezing or shortness of breath., Disp: 1 each, Rfl: 3   amoxicillin-clavulanate (AUGMENTIN) 875-125 MG tablet, Take 1 tablet by mouth 2 (two) times daily., Disp: 14 tablet, Rfl: 0   aspirin EC 81 MG tablet, Take 1 tablet (81 mg total) by mouth daily., Disp: 90 tablet, Rfl: 3   atorvastatin (LIPITOR) 80 MG tablet, Take 1 tablet (80 mg total) by mouth at bedtime., Disp: 90 tablet, Rfl: 1   Cranberry 1000 MG CAPS, Take 1 capsule by mouth daily. , Disp: , Rfl:    diclofenac (VOLTAREN) 75 MG EC tablet, TAKE 1 TABLET BY MOUTH TWICE A DAY, Disp: 30 tablet, Rfl: 0   Diclofenac Sodium 1 % CREA, Apply a small amount to affected area once daily, Disp: 120 g,  Rfl: 1   HYDROcodone-acetaminophen (NORCO/VICODIN) 5-325 MG tablet, Take 1 tablet by mouth every 6 (six) hours as needed., Disp: 15 tablet, Rfl: 0   hydrocortisone 2.5 % cream, Apply topically 2 (two) times daily., Disp: 30 g, Rfl: 0   losartan (COZAAR) 100 MG tablet, Take 1 tablet (100 mg total) by mouth daily., Disp: 90 tablet, Rfl: 1   metFORMIN (GLUCOPHAGE) 1000 MG tablet, Take 1 tablet (1,000 mg total) by mouth 2 (two) times daily with a meal., Disp: 180 tablet, Rfl: 1   metoprolol tartrate (LOPRESSOR) 25 MG tablet, Take 1 tablet (25 mg total) by  mouth 2 (two) times daily., Disp: 180 tablet, Rfl: 1   Multiple Vitamins-Minerals (HAIR/SKIN/NAILS PO), Take 2 each by mouth daily., Disp: , Rfl:    nitroGLYCERIN (NITROSTAT) 0.4 MG SL tablet, Place 1 tablet (0.4 mg total) under the tongue every 5 (five) minutes as needed for chest pain., Disp: 30 tablet, Rfl: 2   predniSONE (DELTASONE) 20 MG tablet, Take 2 tablets by mouth once daily for 5 days, Disp: 10 tablet, Rfl: 0   vitamin B-12 (CYANOCOBALAMIN) 100 MCG tablet, Take 50 mcg by mouth daily., Disp: , Rfl:    vitamin C (ASCORBIC ACID) 500 MG tablet, Take 500 mg by mouth daily., Disp: , Rfl:    Vitamin D, Ergocalciferol, (DRISDOL) 1.25 MG (50000 UNIT) CAPS capsule, Take 1 capsule (50,000 Units total) by mouth every 7 (seven) days for 12 doses., Disp: 12 capsule, Rfl: 0   vitamin E 100 UNIT capsule, Take 100 Units by mouth daily., Disp: , Rfl:   EXAM:  VITALS per patient if applicable:  GENERAL: alert, oriented, appears well and in no acute distress  HEENT: atraumatic, conjunttiva clear, no obvious abnormalities on inspection of external nose and ears  NECK: normal movements of the head and neck  LUNGS: on inspection no signs of respiratory distress, breathing rate appears normal, no obvious gross SOB, gasping or wheezing  CV: no obvious cyanosis  MS: moves all visible extremities without noticeable abnormality  PSYCH/NEURO: pleasant and cooperative, no obvious depression or anxiety, speech and thought processing grossly intact  ASSESSMENT AND PLAN:  Discussed the following assessment and plan:  Productive cough and wheezing.  May be viral but patient concerned because of her wheezing tendency and productive cough worsening past several days.  No fever.  -We decided to cover with Augmentin 875 mg by mouth twice daily for 7 days -Prednisone 40 mg daily for 5 days.  She is aware this may exacerbate her blood sugar control and she will monitor closely -Continue albuterol inhaler as  needed -Follow-up for any fever, increased shortness of breath, or other concerns     I discussed the assessment and treatment plan with the patient. The patient was provided an opportunity to ask questions and all were answered. The patient agreed with the plan and demonstrated an understanding of the instructions.   The patient was advised to call back or seek an in-person evaluation if the symptoms worsen or if the condition fails to improve as anticipated.     Carolann Littler, MD

## 2021-10-16 ENCOUNTER — Other Ambulatory Visit: Payer: Self-pay

## 2021-10-16 ENCOUNTER — Ambulatory Visit (INDEPENDENT_AMBULATORY_CARE_PROVIDER_SITE_OTHER): Payer: BC Managed Care – PPO

## 2021-10-16 DIAGNOSIS — E538 Deficiency of other specified B group vitamins: Secondary | ICD-10-CM | POA: Diagnosis not present

## 2021-10-16 MED ORDER — CYANOCOBALAMIN 1000 MCG/ML IJ SOLN
1000.0000 ug | Freq: Once | INTRAMUSCULAR | Status: AC
  Administered 2021-10-16: 1000 ug via INTRAMUSCULAR

## 2021-10-16 NOTE — Progress Notes (Signed)
Per orders of Dr. Hernandez, injection of Cyanocobalamin 1000 mcg given by Gerrick Ray L Kobe Jansma. Patient tolerated injection well.  

## 2021-11-04 ENCOUNTER — Telehealth: Payer: Self-pay

## 2021-11-04 NOTE — Telephone Encounter (Signed)
The caller states that she is experiencing a nose bleed that last around 7 minutes.   11/03/2021 1:36:16 PM [place], RN, Diane

## 2021-11-17 ENCOUNTER — Ambulatory Visit: Payer: BC Managed Care – PPO

## 2021-11-21 ENCOUNTER — Ambulatory Visit: Payer: BC Managed Care – PPO | Admitting: Internal Medicine

## 2021-11-27 ENCOUNTER — Ambulatory Visit (INDEPENDENT_AMBULATORY_CARE_PROVIDER_SITE_OTHER): Payer: BC Managed Care – PPO | Admitting: Internal Medicine

## 2021-11-27 ENCOUNTER — Encounter: Payer: Self-pay | Admitting: Internal Medicine

## 2021-11-27 VITALS — BP 140/88 | HR 64 | Temp 98.5°F | Ht 62.0 in | Wt 195.0 lb

## 2021-11-27 DIAGNOSIS — Z23 Encounter for immunization: Secondary | ICD-10-CM

## 2021-11-27 DIAGNOSIS — I10 Essential (primary) hypertension: Secondary | ICD-10-CM

## 2021-11-27 DIAGNOSIS — E785 Hyperlipidemia, unspecified: Secondary | ICD-10-CM

## 2021-11-27 DIAGNOSIS — E538 Deficiency of other specified B group vitamins: Secondary | ICD-10-CM

## 2021-11-27 DIAGNOSIS — E1169 Type 2 diabetes mellitus with other specified complication: Secondary | ICD-10-CM

## 2021-11-27 LAB — POCT GLYCOSYLATED HEMOGLOBIN (HGB A1C): Hemoglobin A1C: 7.3 % — AB (ref 4.0–5.6)

## 2021-11-27 MED ORDER — CYANOCOBALAMIN 1000 MCG/ML IJ SOLN
1000.0000 ug | Freq: Once | INTRAMUSCULAR | Status: AC
Start: 2021-11-27 — End: 2021-11-27
  Administered 2021-11-27: 1000 ug via INTRAMUSCULAR

## 2021-11-27 MED ORDER — HYDROCHLOROTHIAZIDE 25 MG PO TABS: 25.0000 mg | ORAL_TABLET | Freq: Every day | ORAL | 1 refills | Status: DC

## 2021-11-27 MED ORDER — TIRZEPATIDE 2.5 MG/0.5ML ~~LOC~~ SOAJ: 2.5000 mg | SUBCUTANEOUS | 0 refills | Status: AC

## 2021-11-27 NOTE — Progress Notes (Signed)
Established Patient Office Visit     This visit occurred during the SARS-CoV-2 public health emergency.  Safety protocols were in place, including screening questions prior to the visit, additional usage of staff PPE, and extensive cleaning of exam room while observing appropriate contact time as indicated for disinfecting solutions.    CC/Reason for Visit: 60-month follow-up chronic medical conditions  HPI: Katie Morse is a 55 y.o. female who is coming in today for the above mentioned reasons. Past Medical History is significant for: Obesity, type 2 diabetes that has been well controlled in the past, hypertension, hyperlipidemia, vitamin D deficiency, vitamin B12 deficiency.  She fell while at work and has been having some minor knee and ankle issues and is requesting a handicap placard filled out today so she does not have to walk long distances after parking.  She is not fully compliant with medications.  She is taking metformin only once a day it appears she may be taking atorvastatin twice daily and metoprolol daily.  She states she quit taking metformin twice daily due to side effects.  She states the side effects were joint aches and pains, I wonder if this is due to taking her statin twice daily instead.  She is requesting B12 injection and flu vaccine today.   Past Medical/Surgical History: Past Medical History:  Diagnosis Date   ALLERGIC RHINITIS 08/20/2008   Allergy    Anemia    Blood transfusion without reported diagnosis 2011   post surgery   Diabetes mellitus without complication ([place])    DYSPNEA 11/12/2008   Headache(784.0) 08/20/2008   HYPERTENSION 08/20/2008   LEIOMYOMA, UTERUS 08/20/2008   PALPITATIONS, RECURRENT 11/12/2008   Sickle-cell trait ([place]) 7/67/2094   SYSTOLIC MURMUR 70/96/2836    Past Surgical History:  Procedure Laterality Date   LIPOMA EXCISION Left 07/19/2019   Procedure: EXCISION OF SUBCUTANEOUS LIPOMA LEFT BUTTOCK;  Surgeon: Donnie Mesa, MD;  Location: [facility];  Service: General;  Laterality: Left;   MYOMECTOMY     fibroids    Social History:  reports that she has never smoked. She has never used smokeless tobacco. She reports current alcohol use. She reports that she does not use drugs.  Allergies: Allergies  Allergen Reactions   Latex    Metformin Hcl Other (See Comments)    Family History:  Family History  Problem Relation Age of Onset   Hypertension Mother    Heart Problems Mother    Cancer Father        lung and prostate ca   Diabetes Other    Breast cancer Cousin        2-twins   Colon cancer Neg Hx      Current Outpatient Medications:    albuterol (VENTOLIN HFA) 108 (90 Base) MCG/ACT inhaler, Inhale 1 puff into the lungs every 6 (six) hours as needed for wheezing or shortness of breath., Disp: 1 each, Rfl: 3   aspirin EC 81 MG tablet, Take 1 tablet (81 mg total) by mouth daily., Disp: 90 tablet, Rfl: 3   atorvastatin (LIPITOR) 80 MG tablet, Take 1 tablet (80 mg total) by mouth at bedtime., Disp: 90 tablet, Rfl: 1   hydrochlorothiazide (HYDRODIURIL) 25 MG tablet, Take 1 tablet (25 mg total) by mouth daily., Disp: 90 tablet, Rfl: 1   hydrocortisone 2.5 % cream, Apply topically 2 (two) times daily., Disp: 30 g, Rfl: 0   losartan (COZAAR) 100 MG tablet, Take 1 tablet (100 mg total) by  mouth daily., Disp: 90 tablet, Rfl: 1   metFORMIN (GLUCOPHAGE) 1000 MG tablet, Take 1 tablet (1,000 mg total) by mouth 2 (two) times daily with a meal., Disp: 180 tablet, Rfl: 1   metoprolol tartrate (LOPRESSOR) 25 MG tablet, Take 1 tablet (25 mg total) by mouth 2 (two) times daily., Disp: 180 tablet, Rfl: 1   Multiple Vitamins-Minerals (HAIR/SKIN/NAILS PO), Take 2 each by mouth daily., Disp: , Rfl:    tirzepatide (MOUNJARO) 2.5 MG/0.5ML Pen, Inject 2.5 mg into the skin once a week., Disp: 6 mL, Rfl: 0   vitamin B-12 (CYANOCOBALAMIN) 100 MCG tablet, Take 50 mcg by mouth daily., Disp: , Rfl:     vitamin C (ASCORBIC ACID) 500 MG tablet, Take 500 mg by mouth daily., Disp: , Rfl:    vitamin E 100 UNIT capsule, Take 100 Units by mouth daily., Disp: , Rfl:   Current Facility-Administered Medications:    cyanocobalamin ((VITAMIN B-12)) injection 1,000 mcg, 1,000 mcg, Intramuscular, Once, Katie Morse, Katie Halsted, MD  Review of Systems:  Constitutional: Denies fever, chills, diaphoresis, appetite change and fatigue.  HEENT: Denies photophobia, eye pain, redness, hearing loss, ear pain, congestion, sore throat, rhinorrhea, sneezing, mouth sores, trouble swallowing, neck pain, neck stiffness and tinnitus.   Respiratory: Denies SOB, DOE, cough, chest tightness,  and wheezing.   Cardiovascular: Denies chest pain, palpitations and leg swelling.  Gastrointestinal: Denies nausea, vomiting, abdominal pain, diarrhea, constipation, blood in stool and abdominal distention.  Genitourinary: Denies dysuria, urgency, frequency, hematuria, flank pain and difficulty urinating.  Endocrine: Denies: hot or cold intolerance, sweats, changes in hair or nails, polyuria, polydipsia. Musculoskeletal: Denies myalgias, back pain, joint swelling, arthralgias and gait problem.  Skin: Denies pallor, rash and wound.  Neurological: Denies dizziness, seizures, syncope, weakness, light-headedness, numbness and headaches.  Hematological: Denies adenopathy. Easy bruising, personal or family bleeding history  Psychiatric/Behavioral: Denies suicidal ideation, mood changes, confusion, nervousness, sleep disturbance and agitation    Physical Exam: Vitals:   11/27/21 0847  BP: 140/88  Pulse: 64  Temp: 98.5 F (36.9 C)  TempSrc: Oral  SpO2: 98%  Weight: 195 lb (88.5 kg)  Height: 5\' 2"  (1.575 m)    Body mass index is 35.67 kg/m.   Constitutional: NAD, calm, comfortable Eyes: PERRL, lids and conjunctivae normal ENMT: Mucous membranes are moist.  Respiratory: clear to auscultation bilaterally, no wheezing, no  crackles. Normal respiratory effort. No accessory muscle use.  Cardiovascular: Regular rate and rhythm, no murmurs / rubs / gallops. No extremity edema.  Neurologic: Grossly intact and nonfocal Psychiatric: Normal judgment and insight. Alert and oriented x 3. Normal mood.    Impression and Plan:  Vitamin B12 deficiency  - Plan: cyanocobalamin ((VITAMIN B-12)) injection 1,000 mcg  Type 2 diabetes mellitus with other specified complication, without long-term current use of insulin (HCC)  - Plan: POCT glycosylated hemoglobin (Hb A1C), tirzepatide (MOUNJARO) 2.5 MG/0.5ML Pen -Not well controlled with an A1c of 7.3 today, she does not want to increase metformin to twice daily. -Add mounjaro 2.5 mg injected weekly.  Hyperlipidemia associated with type 2 diabetes mellitus (HCC) -Atorvastatin should be taken only once daily, recheck fasting lipids when she returns.  Essential hypertension  - Plan: hydrochlorothiazide (HYDRODIURIL) 25 MG tablet -Continue losartan 100 mg daily, advised to take metoprolol twice daily, add hydrochlorothiazide 25 mg daily. -Return in 3 months for follow-up.  Morbid obesity ([place]) -Discussed healthy lifestyle, including increased physical activity and better food choices to promote weight loss.  Need for influenza vaccination -Flu  vaccine administered today.  Time spent: 32 minutes reviewing chart, interviewing and examining patient and formulating plan of care.   Patient Instructions  -Nice seeing you today!!  -Start HCTZ 25 mg daily.  -Start Mounjaro 2.5 mg injected weekly.  -Schedule follow up in 3 months.   Lelon Frohlich, MD [facility]

## 2021-11-27 NOTE — Patient Instructions (Signed)
-  Nice seeing you today!!  -Start HCTZ 25 mg daily.  -Start Mounjaro 2.5 mg injected weekly.  -Schedule follow up in 3 months.

## 2021-11-27 NOTE — Addendum Note (Signed)
Addended by: Westley Hummer B on: 11/27/2021 10:58 AM   Modules accepted: Orders

## 2022-01-08 ENCOUNTER — Ambulatory Visit (INDEPENDENT_AMBULATORY_CARE_PROVIDER_SITE_OTHER): Payer: BC Managed Care – PPO

## 2022-01-08 DIAGNOSIS — E538 Deficiency of other specified B group vitamins: Secondary | ICD-10-CM

## 2022-01-08 MED ORDER — CYANOCOBALAMIN 1000 MCG/ML IJ SOLN
1000.0000 ug | INTRAMUSCULAR | Status: AC
  Administered 2022-01-08: 1000 ug via INTRAMUSCULAR

## 2022-01-08 NOTE — Progress Notes (Signed)
Pt here for monthly B12 injection per Dr Hernandez.  B12 1000mcg given IM right deltoid and pt tolerated injection well.    

## 2022-02-12 ENCOUNTER — Ambulatory Visit (INDEPENDENT_AMBULATORY_CARE_PROVIDER_SITE_OTHER): Payer: BC Managed Care – PPO | Admitting: *Deleted

## 2022-02-12 DIAGNOSIS — E538 Deficiency of other specified B group vitamins: Secondary | ICD-10-CM | POA: Diagnosis not present

## 2022-02-12 MED ORDER — CYANOCOBALAMIN 1000 MCG/ML IJ SOLN
1000.0000 ug | Freq: Once | INTRAMUSCULAR | Status: AC
  Administered 2022-02-12: 1000 ug via INTRAMUSCULAR

## 2022-02-12 NOTE — Progress Notes (Signed)
Pt here for monthly B12 injection per   B12 1000mcg given IM, and pt tolerated injection well.  Next B12 injection scheduled for x 1 month 

## 2022-02-26 ENCOUNTER — Ambulatory Visit: Payer: BC Managed Care – PPO | Admitting: Internal Medicine

## 2022-03-05 ENCOUNTER — Ambulatory Visit (INDEPENDENT_AMBULATORY_CARE_PROVIDER_SITE_OTHER): Payer: BC Managed Care – PPO | Admitting: Internal Medicine

## 2022-03-05 ENCOUNTER — Encounter: Payer: Self-pay | Admitting: Internal Medicine

## 2022-03-05 VITALS — BP 150/100 | HR 65 | Temp 98.0°F | Wt 198.0 lb

## 2022-03-05 DIAGNOSIS — E119 Type 2 diabetes mellitus without complications: Secondary | ICD-10-CM | POA: Diagnosis not present

## 2022-03-05 DIAGNOSIS — E785 Hyperlipidemia, unspecified: Secondary | ICD-10-CM | POA: Diagnosis not present

## 2022-03-05 DIAGNOSIS — I10 Essential (primary) hypertension: Secondary | ICD-10-CM

## 2022-03-05 DIAGNOSIS — E538 Deficiency of other specified B group vitamins: Secondary | ICD-10-CM | POA: Diagnosis not present

## 2022-03-05 DIAGNOSIS — E559 Vitamin D deficiency, unspecified: Secondary | ICD-10-CM

## 2022-03-05 DIAGNOSIS — E1169 Type 2 diabetes mellitus with other specified complication: Secondary | ICD-10-CM | POA: Diagnosis not present

## 2022-03-05 DIAGNOSIS — G8929 Other chronic pain: Secondary | ICD-10-CM

## 2022-03-05 DIAGNOSIS — M25512 Pain in left shoulder: Secondary | ICD-10-CM

## 2022-03-05 LAB — POCT GLYCOSYLATED HEMOGLOBIN (HGB A1C): Hemoglobin A1C: 7.4 % — AB (ref 4.0–5.6)

## 2022-03-05 LAB — LIPID PANEL
Cholesterol: 190 mg/dL (ref 0–200)
HDL: 74.1 mg/dL (ref >39.00)
LDL Cholesterol: 104 mg/dL — ABNORMAL HIGH (ref 0–99)
NonHDL: 115.43
Total CHOL/HDL Ratio: 3
Triglycerides: 58 mg/dL (ref 0.0–149.0)
VLDL: 11.6 mg/dL (ref 0.0–40.0)

## 2022-03-05 LAB — VITAMIN D 25 HYDROXY (VIT D DEFICIENCY, FRACTURES): VITD: 31.07 ng/mL (ref 30.00–100.00)

## 2022-03-05 MED ORDER — CYCLOBENZAPRINE HCL 5 MG PO TABS: 5.0000 mg | ORAL_TABLET | Freq: Every evening | ORAL | 1 refills | Status: DC | PRN

## 2022-03-05 MED ORDER — AMLODIPINE BESYLATE 5 MG PO TABS: 5.0000 mg | ORAL_TABLET | Freq: Every day | ORAL | 1 refills | Status: DC

## 2022-03-05 MED ORDER — CYANOCOBALAMIN 1000 MCG/ML IJ SOLN
1000.0000 ug | Freq: Once | INTRAMUSCULAR | Status: AC
  Administered 2022-03-05: 1000 ug via INTRAMUSCULAR

## 2022-03-05 MED ORDER — EMPAGLIFLOZIN 10 MG PO TABS: 10.0000 mg | ORAL_TABLET | Freq: Every day | ORAL | 1 refills | Status: DC

## 2022-03-05 NOTE — Patient Instructions (Signed)
-  Nice seeing you today!! ? ?-Lab work today; will notify you once results are available. ? ?-Start Jardiance 10 mg daily for your diabetes. You should also be on metformin 1000 mg twice daily. ? ?-Start amlodipine 5 mg daily for your blood pressure. You should also be on HCTZ 25 mg daily, losartan 100 mg daily and metoprolol 25 mg twice daily. ? ?-Schedule follow up in 3 months. ? ? ?

## 2022-03-05 NOTE — Progress Notes (Signed)
? ? ? ?Established Patient Office Visit ? ? ? ? ?This visit occurred during the SARS-CoV-2 public health emergency.  Safety protocols were in place, including screening questions prior to the visit, additional usage of staff PPE, and extensive cleaning of exam room while observing appropriate contact time as indicated for disinfecting solutions.  ? ? ?CC/Reason for Visit: Follow-up chronic conditions ? ?HPI: Katie Morse is a 56 y.o. female who is coming in today for the above mentioned reasons. Past Medical History is significant for: Type 2 diabetes, hypertension, hyperlipidemia, obesity, vitamin D and B12 deficiencies.  She is requesting a B12 injection today.  We have confirmed with her that she is adherent to medical therapy.  For blood pressure she is on losartan 100 mg, hydrochlorothiazide 25 mg and metoprolol 25 mg twice daily.  For diabetes she is taking metformin at 1000 mg twice daily.  Her insurance did not cover GLP-1/GIPs so she has not been taking.  She is due for lipids and vitamin D recheck. ? ? ?Past Medical/Surgical History: ?Past Medical History:  ?Diagnosis Date  ? ALLERGIC RHINITIS 08/20/2008  ? Allergy   ? Anemia   ? Blood transfusion without reported diagnosis 2011  ? post surgery  ? Diabetes mellitus without complication ([place])   ? DYSPNEA 11/12/2008  ? Headache(784.0) 08/20/2008  ? HYPERTENSION 08/20/2008  ? LEIOMYOMA, UTERUS 08/20/2008  ? PALPITATIONS, RECURRENT 11/12/2008  ? Sickle-cell trait ([employer]) 08/20/2008  ? SYSTOLIC MURMUR 16/38/4536  ? ? ?Past Surgical History:  ?Procedure Laterality Date  ? LIPOMA EXCISION Left 07/19/2019  ? Procedure: EXCISION OF SUBCUTANEOUS LIPOMA LEFT BUTTOCK;  Surgeon: Donnie Mesa, MD;  Location: [facility];  Service: General;  Laterality: Left;  ? MYOMECTOMY    ? fibroids  ? ? ?Social History: ? reports that she has never smoked. She has never used smokeless tobacco. She reports current alcohol use. She reports that she does not use  drugs. ? ?Allergies: ?Allergies  ?Allergen Reactions  ? Latex   ? Metformin Hcl Other (See Comments)  ? ? ?Family History:  ?Family History  ?Problem Relation Age of Onset  ? Hypertension Mother   ? Heart Problems Mother   ? Cancer Father   ?     lung and prostate ca  ? Diabetes Other   ? Breast cancer Cousin   ?     2-twins  ? Colon cancer Neg Hx   ? ? ? ?Current Outpatient Medications:  ?  albuterol (VENTOLIN HFA) 108 (90 Base) MCG/ACT inhaler, Inhale 1 puff into the lungs every 6 (six) hours as needed for wheezing or shortness of breath., Disp: 1 each, Rfl: 3 ?  amLODipine (NORVASC) 5 MG tablet, Take 1 tablet (5 mg total) by mouth daily., Disp: 90 tablet, Rfl: 1 ?  aspirin EC 81 MG tablet, Take 1 tablet (81 mg total) by mouth daily., Disp: 90 tablet, Rfl: 3 ?  atorvastatin (LIPITOR) 80 MG tablet, Take 1 tablet (80 mg total) by mouth at bedtime., Disp: 90 tablet, Rfl: 1 ?  cyclobenzaprine (FLEXERIL) 5 MG tablet, Take 1 tablet (5 mg total) by mouth at bedtime as needed for muscle spasms., Disp: 30 tablet, Rfl: 1 ?  empagliflozin (JARDIANCE) 10 MG TABS tablet, Take 1 tablet (10 mg total) by mouth daily before breakfast., Disp: 90 tablet, Rfl: 1 ?  hydrochlorothiazide (HYDRODIURIL) 25 MG tablet, Take 1 tablet (25 mg total) by mouth daily., Disp: 90 tablet, Rfl: 1 ?  hydrocortisone 2.5 % cream, Apply  topically 2 (two) times daily., Disp: 30 g, Rfl: 0 ?  losartan (COZAAR) 100 MG tablet, Take 1 tablet (100 mg total) by mouth daily., Disp: 90 tablet, Rfl: 1 ?  metFORMIN (GLUCOPHAGE) 1000 MG tablet, Take 1 tablet (1,000 mg total) by mouth 2 (two) times daily with a meal., Disp: 180 tablet, Rfl: 1 ?  metoprolol tartrate (LOPRESSOR) 25 MG tablet, Take 1 tablet (25 mg total) by mouth 2 (two) times daily., Disp: 180 tablet, Rfl: 1 ?  Multiple Vitamins-Minerals (HAIR/SKIN/NAILS PO), Take 2 each by mouth daily., Disp: , Rfl:  ?  vitamin B-12 (CYANOCOBALAMIN) 100 MCG tablet, Take 50 mcg by mouth daily., Disp: , Rfl:  ?  vitamin  C (ASCORBIC ACID) 500 MG tablet, Take 500 mg by mouth daily., Disp: , Rfl:  ?  vitamin E 100 UNIT capsule, Take 100 Units by mouth daily., Disp: , Rfl:  ? ?Current Facility-Administered Medications:  ?  cyanocobalamin ((VITAMIN B-12)) injection 1,000 mcg, 1,000 mcg, Intramuscular, Q30 days, Isaac [facility]iss, Rayford Halsted, MD, 1,000 mcg at 01/08/22 1357 ? ?Review of Systems:  ?Constitutional: Denies fever, chills, diaphoresis, appetite change and fatigue.  ?HEENT: Denies photophobia, eye pain, redness, hearing loss, ear pain, congestion, sore throat, rhinorrhea, sneezing, mouth sores, trouble swallowing, neck pain, neck stiffness and tinnitus.   ?Respiratory: Denies SOB, DOE, cough, chest tightness,  and wheezing.   ?Cardiovascular: Denies chest pain, palpitations and leg swelling.  ?Gastrointestinal: Denies nausea, vomiting, abdominal pain, diarrhea, constipation, blood in stool and abdominal distention.  ?Genitourinary: Denies dysuria, urgency, frequency, hematuria, flank pain and difficulty urinating.  ?Endocrine: Denies: hot or cold intolerance, sweats, changes in hair or nails, polyuria, polydipsia. ?Musculoskeletal: Denies myalgias, back pain, joint swelling, arthralgias and gait problem.  ?Skin: Denies pallor, rash and wound.  ?Neurological: Denies dizziness, seizures, syncope, weakness, light-headedness, numbness and headaches.  ?Hematological: Denies adenopathy. Easy bruising, personal or family bleeding history  ?Psychiatric/Behavioral: Denies suicidal ideation, mood changes, confusion, nervousness, sleep disturbance and agitation ? ? ? ?Physical Exam: ?Vitals:  ? 03/05/22 1026  ?BP: (!) 150/100  ?Pulse: 65  ?Temp: 98 ?F (36.7 ?C)  ?TempSrc: Oral  ?SpO2: 98%  ?Weight: 198 lb (89.8 kg)  ? ? ?Body mass index is 36.21 kg/m?. ? ? ?Constitutional: NAD, calm, comfortable ?Eyes: PERRL, lids and conjunctivae normal ?ENMT: Mucous membranes are moist.  ?Respiratory: clear to auscultation bilaterally, no wheezing, no  crackles. Normal respiratory effort. No accessory muscle use.  ?Cardiovascular: Regular rate and rhythm, no murmurs / rubs / gallops. No extremity edema.  ?Neurologic: Grossly intact and nonfocal ?Psychiatric: Normal judgment and insight. Alert and oriented x 3. Normal mood.  ? ? ?Impression and Plan: ? ?Type 2 diabetes mellitus without complication, without long-term current use of insulin ([place]) ? - Plan: POCT glycosylated hemoglobin (Hb A1C), empagliflozin (JARDIANCE) 10 MG TABS tablet ?-A1c remains above goal at 7.5. ?-Continue maximal dose metformin and add Jardiance 10 mg daily, return in 3 months for follow-up ? ?Vitamin D deficiency  ?- Plan: VITAMIN D 25 Hydroxy (Vit-D Deficiency, Fractures), CANCELED: VITAMIN D 25 Hydroxy (Vit-D Deficiency, Fractures) ? ?Hyperlipidemia associated with type 2 diabetes mellitus ([place]) ? - Plan: Lipid panel, CANCELED: Lipid panel ?-Recheck lipids today, LDL was 90 in August, she is on atorvastatin 80 mg daily. ? ?Essential hypertension  ?- Plan: amLODipine (NORVASC) 5 MG tablet ?-Blood pressure remains uncontrolled, to above regimen we will add Norvasc 5 mg daily and she will return in 3 months for follow-up.  Consider referral to advanced  hypertension clinic if remains elevated at that time. ? ?Vitamin B12 deficiency ?-Monthly IM B12 today. ? ?Chronic left shoulder pain  ?- Plan: cyclobenzaprine (FLEXERIL) 5 MG tablet ? ?Time spent: 33 minutes reviewing chart, interviewing and examining patient and formulating plan of care. ? ? ?Patient Instructions  ?-Nice seeing you today!! ? ?-Lab work today; will notify you once results are available. ? ?-Start Jardiance 10 mg daily for your diabetes. You should also be on metformin 1000 mg twice daily. ? ?-Start amlodipine 5 mg daily for your blood pressure. You should also be on HCTZ 25 mg daily, losartan 100 mg daily and metoprolol 25 mg twice daily. ? ?-Schedule follow up in 3 months. ? ? ? ? ? ?Lelon Frohlich, MD ?[place]  [facility] ? ? ?

## 2022-03-08 ENCOUNTER — Other Ambulatory Visit: Payer: Self-pay | Admitting: Internal Medicine

## 2022-03-08 DIAGNOSIS — I10 Essential (primary) hypertension: Secondary | ICD-10-CM

## 2022-03-10 ENCOUNTER — Encounter: Payer: Self-pay | Admitting: Internal Medicine

## 2022-03-10 MED ORDER — EZETIMIBE 10 MG PO TABS: 10.0000 mg | ORAL_TABLET | Freq: Every day | ORAL | 1 refills | Status: DC

## 2022-03-10 MED ORDER — DICLOFENAC SODIUM 75 MG PO TBEC: 75.0000 mg | DELAYED_RELEASE_TABLET | Freq: Two times a day (BID) | ORAL | 2 refills | Status: DC

## 2022-03-10 NOTE — Addendum Note (Signed)
Addended by: Westley Hummer B on: 03/10/2022 12:04 PM ? ? Modules accepted: Orders ? ?

## 2022-03-17 ENCOUNTER — Encounter: Payer: Self-pay | Admitting: Internal Medicine

## 2022-03-24 ENCOUNTER — Encounter: Payer: Self-pay | Admitting: Family Medicine

## 2022-03-24 ENCOUNTER — Ambulatory Visit (INDEPENDENT_AMBULATORY_CARE_PROVIDER_SITE_OTHER): Payer: BC Managed Care – PPO | Admitting: Family Medicine

## 2022-03-24 VITALS — BP 140/86 | HR 91 | Temp 98.4°F | Ht 62.0 in | Wt 193.8 lb

## 2022-03-24 DIAGNOSIS — H109 Unspecified conjunctivitis: Secondary | ICD-10-CM

## 2022-03-24 DIAGNOSIS — B9689 Other specified bacterial agents as the cause of diseases classified elsewhere: Secondary | ICD-10-CM | POA: Diagnosis not present

## 2022-03-24 MED ORDER — POLYMYXIN B-TRIMETHOPRIM 10000-0.1 UNIT/ML-% OP SOLN: 2.0000 [drp] | OPHTHALMIC | 0 refills | Status: AC

## 2022-03-24 NOTE — Patient Instructions (Signed)
May try Claritan, Zyrtec, or Allegra for allergy symptoms ? ?May also use Flonase nasal  ?

## 2022-03-24 NOTE — Progress Notes (Signed)
? ?Established Patient Office Visit ? ?Subjective:  ?Patient ID: Katie Morse, female    DOB: 1966-08-06  Age: 56 y.o. MRN: [id number] ? ?CC:  ?Chief Complaint  ?Patient presents with  ? Eye Drainage  ?  Patient complains of eye drainage, x2 days   ? Ear Pain  ?  Patient complains of right ear pain, x2 days   ? ? ?HPI ?Katie Morse presents for 2-day history of bilateral eye redness and some drainage.  She does have some right ear pain for the past 2 days as well.  No fever.  No blurred vision.  No eye pain.  She had thick crusted drainage early in the mornings.  She does wear contacts. ? ?She has history of hypertension and type 2 diabetes. ? ?Past Medical History:  ?Diagnosis Date  ? ALLERGIC RHINITIS 08/20/2008  ? Allergy   ? Anemia   ? Blood transfusion without reported diagnosis 2011  ? post surgery  ? Diabetes mellitus without complication ([place])   ? DYSPNEA 11/12/2008  ? Headache(784.0) 08/20/2008  ? HYPERTENSION 08/20/2008  ? LEIOMYOMA, UTERUS 08/20/2008  ? PALPITATIONS, RECURRENT 11/12/2008  ? Sickle-cell trait ([place]) 08/20/2008  ? SYSTOLIC MURMUR 10/17/2535  ? ? ?Past Surgical History:  ?Procedure Laterality Date  ? LIPOMA EXCISION Left 07/19/2019  ? Procedure: EXCISION OF SUBCUTANEOUS LIPOMA LEFT BUTTOCK;  Surgeon: Donnie Mesa, MD;  Location: [facility];  Service: General;  Laterality: Left;  ? MYOMECTOMY    ? fibroids  ? ? ?Family History  ?Problem Relation Age of Onset  ? Hypertension Mother   ? Heart Problems Mother   ? Cancer Father   ?     lung and prostate ca  ? Diabetes Other   ? Breast cancer Cousin   ?     2-twins  ? Colon cancer Neg Hx   ? ? ?Social History  ? ?Socioeconomic History  ? Marital status: Legally Separated  ?  Spouse name: Not on file  ? Number of children: Not on file  ? Years of education: Not on file  ? Highest education level: Some college, no degree  ?Occupational History  ? Not on file  ?Tobacco Use  ? Smoking status: Never  ? Smokeless  tobacco: Never  ?Vaping Use  ? Vaping Use: Never used  ?Substance and Sexual Activity  ? Alcohol use: Yes  ?  Alcohol/week: 0.0 standard drinks  ?  Comment: rarely  ? Drug use: No  ? Sexual activity: Not on file  ?Other Topics Concern  ? Not on file  ?Social History Narrative  ? Not on file  ? ?Social Determinants of Health  ? ?Financial Resource Strain: Medium Risk  ? Difficulty of Paying Living Expenses: Somewhat hard  ?Food Insecurity: No Food Insecurity  ? Worried About Charity fundraiser in the Last Year: Never true  ? Ran Out of Food in the Last Year: Never true  ?Transportation Needs: No Transportation Needs  ? Lack of Transportation (Medical): No  ? Lack of Transportation (Non-Medical): No  ?Physical Activity: Insufficiently Active  ? Days of Exercise per Week: 3 days  ? Minutes of Exercise per Session: 30 min  ?Stress: Stress Concern Present  ? Feeling of Stress : To some extent  ?Social Connections: Moderately Integrated  ? Frequency of Communication with Friends and Family: More than three times a week  ? Frequency of Social Gatherings with Friends and Family: More than three times a week  ? Attends  Religious Services: More than 4 times per year  ? Active Member of Clubs or Organizations: Yes  ? Attends Archivist Meetings: More than 4 times per year  ? Marital Status: Separated  ?Intimate Partner Violence: Not on file  ? ? ?Outpatient Medications Prior to Visit  ?Medication Sig Dispense Refill  ? albuterol (VENTOLIN HFA) 108 (90 Base) MCG/ACT inhaler Inhale 1 puff into the lungs every 6 (six) hours as needed for wheezing or shortness of breath. 1 each 3  ? amLODipine (NORVASC) 5 MG tablet Take 1 tablet (5 mg total) by mouth daily. 90 tablet 1  ? ASPIRIN LOW DOSE 81 MG EC tablet TAKE 1 TABLET BY MOUTH EVERY DAY 30 tablet 11  ? atorvastatin (LIPITOR) 80 MG tablet Take 1 tablet (80 mg total) by mouth at bedtime. 90 tablet 1  ? cyclobenzaprine (FLEXERIL) 5 MG tablet Take 1 tablet (5 mg total) by  mouth at bedtime as needed for muscle spasms. 30 tablet 1  ? diclofenac (VOLTAREN) 75 MG EC tablet Take 1 tablet (75 mg total) by mouth 2 (two) times daily. 30 tablet 2  ? empagliflozin (JARDIANCE) 10 MG TABS tablet Take 1 tablet (10 mg total) by mouth daily before breakfast. 90 tablet 1  ? ezetimibe (ZETIA) 10 MG tablet Take 1 tablet (10 mg total) by mouth daily. 90 tablet 1  ? hydrochlorothiazide (HYDRODIURIL) 25 MG tablet Take 1 tablet (25 mg total) by mouth daily. 90 tablet 1  ? hydrocortisone 2.5 % cream Apply topically 2 (two) times daily. 30 g 0  ? losartan (COZAAR) 100 MG tablet Take 1 tablet (100 mg total) by mouth daily. 90 tablet 1  ? metFORMIN (GLUCOPHAGE) 1000 MG tablet Take 1 tablet (1,000 mg total) by mouth 2 (two) times daily with a meal. 180 tablet 1  ? metoprolol tartrate (LOPRESSOR) 25 MG tablet Take 1 tablet (25 mg total) by mouth 2 (two) times daily. 180 tablet 1  ? Multiple Vitamins-Minerals (HAIR/SKIN/NAILS PO) Take 2 each by mouth daily.    ? vitamin B-12 (CYANOCOBALAMIN) 100 MCG tablet Take 50 mcg by mouth daily.    ? vitamin C (ASCORBIC ACID) 500 MG tablet Take 500 mg by mouth daily.    ? vitamin E 100 UNIT capsule Take 100 Units by mouth daily.    ? ?No facility-administered medications prior to visit.  ? ? ?Allergies  ?Allergen Reactions  ? Latex   ? Metformin Hcl Other (See Comments)  ? ? ?ROS ?Review of Systems  ?Constitutional:  Negative for chills and fever.  ?HENT:  Positive for ear pain. Negative for ear discharge and hearing loss.   ?Eyes:  Positive for discharge, redness and itching. Negative for pain and visual disturbance.  ? ?  ?Objective:  ?  ?Physical Exam ?Vitals reviewed.  ?Constitutional:   ?   Appearance: Normal appearance.  ?HENT:  ?   Right Ear: Tympanic membrane and ear canal normal.  ?   Left Ear: Tympanic membrane and ear canal normal.  ?   Mouth/Throat:  ?   Mouth: Mucous membranes are moist.  ?   Pharynx: Oropharynx is clear. No oropharyngeal exudate or posterior  oropharyngeal erythema.  ?Eyes:  ?   Comments: Erythema involving both conjunctive very erythematous.  No purulent drainage noted at this time.  Pupils equal round reactive to light.  ?Cardiovascular:  ?   Rate and Rhythm: Normal rate and regular rhythm.  ?Pulmonary:  ?   Effort: Pulmonary effort is normal.  ?  Breath sounds: Normal breath sounds.  ?Neurological:  ?   Mental Status: She is alert.  ? ? ?BP 140/86 (BP Location: Left Arm, Patient Position: Sitting, Cuff Size: Normal)   Pulse 91   Temp 98.4 ?F (36.9 ?C) (Oral)   Ht '5\' 2"'$  (1.575 m)   Wt 193 lb 12.8 oz (87.9 kg)   LMP 07/04/2014 Comment: GYN  SpO2 97%   BMI 35.45 kg/m?  ?Wt Readings from Last 3 Encounters:  ?03/24/22 193 lb 12.8 oz (87.9 kg)  ?03/05/22 198 lb (89.8 kg)  ?11/27/21 195 lb (88.5 kg)  ? ? ? ?Health Maintenance Due  ?Topic Date Due  ? HIV Screening  Never done  ? Hepatitis C Screening  Never done  ? FOOT EXAM  08/18/2019  ? COVID-19 Vaccine (4 - Booster for Pfizer series) 09/15/2021  ? ? ?There are no preventive care reminders to display for this patient. ? ?Lab Results  ?Component Value Date  ? TSH 0.75 08/12/2021  ? ?Lab Results  ?Component Value Date  ? WBC 5.1 08/12/2021  ? HGB 12.7 08/12/2021  ? HCT 37.8 08/12/2021  ? MCV 80.7 08/12/2021  ? PLT 261.0 08/12/2021  ? ?Lab Results  ?Component Value Date  ? NA 140 08/12/2021  ? K 4.0 08/12/2021  ? CO2 26 08/12/2021  ? GLUCOSE 111 (H) 08/12/2021  ? BUN 11 08/12/2021  ? CREATININE 0.63 08/12/2021  ? BILITOT 0.8 08/12/2021  ? ALKPHOS 79 08/12/2021  ? AST 13 08/12/2021  ? ALT 15 08/12/2021  ? PROT 8.1 08/12/2021  ? ALBUMIN 4.2 08/12/2021  ? CALCIUM 10.0 08/12/2021  ? GFR 99.27 08/12/2021  ? ?Lab Results  ?Component Value Date  ? CHOL 190 03/05/2022  ? ?Lab Results  ?Component Value Date  ? HDL 74.10 03/05/2022  ? ?Lab Results  ?Component Value Date  ? LDLCALC 104 (H) 03/05/2022  ? ?Lab Results  ?Component Value Date  ? TRIG 58.0 03/05/2022  ? ?Lab Results  ?Component Value Date  ? CHOLHDL  3 03/05/2022  ? ?Lab Results  ?Component Value Date  ? HGBA1C 7.4 (A) 03/05/2022  ? ? ?  ?Assessment & Plan:  ? ?Problem List Items Addressed This Visit   ?None ?Visit Diagnoses   ? ? Bacterial conjunctivitis of bo

## 2022-04-14 DIAGNOSIS — S4991XA Unspecified injury of right shoulder and upper arm, initial encounter: Secondary | ICD-10-CM | POA: Diagnosis not present

## 2022-04-23 ENCOUNTER — Ambulatory Visit (INDEPENDENT_AMBULATORY_CARE_PROVIDER_SITE_OTHER): Payer: BC Managed Care – PPO

## 2022-04-23 DIAGNOSIS — E538 Deficiency of other specified B group vitamins: Secondary | ICD-10-CM

## 2022-04-23 MED ORDER — CYANOCOBALAMIN 1000 MCG/ML IJ SOLN
1000.0000 ug | Freq: Once | INTRAMUSCULAR | Status: AC
  Administered 2022-04-23: 1000 ug via INTRAMUSCULAR

## 2022-04-23 NOTE — Progress Notes (Signed)
Per orders of Dr. Hernandez, injection of Cyanocobalamin 1000 mcg given by Jericka Kadar L Jamy Whyte. Patient tolerated injection well.  

## 2022-05-20 ENCOUNTER — Other Ambulatory Visit: Payer: Self-pay | Admitting: Internal Medicine

## 2022-05-20 DIAGNOSIS — G8929 Other chronic pain: Secondary | ICD-10-CM

## 2022-05-23 ENCOUNTER — Other Ambulatory Visit: Payer: Self-pay | Admitting: Internal Medicine

## 2022-05-23 DIAGNOSIS — I10 Essential (primary) hypertension: Secondary | ICD-10-CM

## 2022-05-25 ENCOUNTER — Ambulatory Visit (INDEPENDENT_AMBULATORY_CARE_PROVIDER_SITE_OTHER): Payer: BC Managed Care – PPO

## 2022-05-25 DIAGNOSIS — E538 Deficiency of other specified B group vitamins: Secondary | ICD-10-CM

## 2022-05-25 MED ORDER — CYANOCOBALAMIN 1000 MCG/ML IJ SOLN
1000.0000 ug | INTRAMUSCULAR | Status: AC
  Administered 2022-05-25 – 2022-07-27 (×3): 1000 ug via INTRAMUSCULAR

## 2022-05-25 NOTE — Progress Notes (Signed)
Pt here for monthly B12 injection per Dr Jerilee Hoh.  B12 1068mg given IM right deltoid and pt tolerated injection well.  Next B12 injection scheduled for 06/25/22.

## 2022-06-06 ENCOUNTER — Other Ambulatory Visit: Payer: Self-pay | Admitting: Internal Medicine

## 2022-06-17 NOTE — Progress Notes (Unsigned)
I, Wendy Poet, LAT, ATC, am serving as scribe for Dr. Lynne Leader.  Katie Morse is a 56 y.o. female who presents to [facility] today for R shoulder pain.  She was last seen by Dr. Georgina Snell on 10/07/20 for L shoulder pain. Since then, pt reports R shoulder pain since 04/14/22 when she fell while rushing through her house, landing on B outstretched hands, R>L.  She was seen at a [facility] in [place] and had a R shoulder XR.  Today, pt reports that she con't to have R shoulder, scapular and upper arm pain.  R shoulder mechanical symptoms: yes Radiating pain: yes into her R upper arm, axilla and proximal scapula Aggravating factors: R shoulder AROM in all directions, aBD is the worst; laying on her R side Treatments tried: Flexeril; diclofenac; IBU; Tylenol; Biofreeze; IcyHot  Diagnostic testing: R shoulder XR- 04/14/22  Pertinent review of systems: No fevers or chills  Relevant historical information: Hypertension and diabetes.   Exam:  BP (!) 150/90 (BP Location: Left Arm, Patient Position: Sitting, Cuff Size: Normal)   Pulse 82   Ht '5\' 2"'$  (1.575 m)   Wt 197 lb 9.6 oz (89.6 kg)   LMP 07/04/2014 Comment: GYN  SpO2 98%   BMI 36.14 kg/m  General: Well Developed, well nourished, and in no acute distress.   MSK: Right shoulder normal-appearing Nontender. Range of motion abduction 90 degrees.  External rotation 20 degrees beyond neutral position functional internal rotation lumbar spine. Strength abduction 3/5.  External rotation 4/5 internal rotation 4/5. Positive Hawkins and Neer's test. Negative Yergason's and speeds test. Pulses capillary refill and sensation are intact distally.   Lab and Radiology Results  Diagnostic Limited MSK Ultrasound of: Right shoulder Biceps tendon is intact. Subscapularis tendon diminished with large hypoechoic structure superficial to subscapularis tendon consistent with partial tear with large  subdeltoid bursitis. Supraspinatus tendon partial rotator cuff tear with partial retraction present. Infraspinatus tendon does appear to be intact. Impression: Concern for significant subscapularis and moderate supraspinatus rotator cuff tears.   Narrative  This result has an attachment that is not available.  TECHNIQUE: XR SHOULDER MIN 2 VIEWS RIGHT Over read interpretation provided.  Primary interpretation provided by on-site provider referenced in electronic medical record at time of over read. Procedure Note  Robina Ade, MD - 04/15/2022  Formatting of this note might be different from the original.  TECHNIQUE: XR SHOULDER MIN 2 VIEWS RIGHT Over read interpretation provided.  Primary interpretation provided by on-site provider referenced in electronic medical record at time of over read.    PRIMARY INTERPRETATION:COMPARISON: none FINDINGS:  3 views of Rt shoulder obtained for review (ext/int rotation AP  and Y-view).  Bones appear to be in correct alignment w/o dislocation or  fracture.  Degenerative changes of AC joint noted. IMPRESSION:  No acute fracture or dislocation visualized. Results viewed in EMR/EPIC:  Primary read performed by Michiel Sites     AGREE/DISAGREE: Agree with primary interpretation   ADDITIONAL FINDINGS:None   Note:  Discordant or unexpected findings sent via [place] to provider and will be reconciled by on-site provider on receipt of radiologist over read.   Electronically Signed by: Elenore Rota on 04/15/2022 8:04 AM   Assessment and Plan: 56 y.o. female with right shoulder pain after fall occurring about 2 months ago in April.  She has significant limitations in her range of motion and strength.  Ultrasound today is concerning for rotator  cuff tear as is physical exam.  Plan for MRI to further characterize injury and for surgical planning.  Likely refer directly to orthopedic surgery based on the results of the MRI.   PDMP not  reviewed this encounter. Orders Placed This Encounter  Procedures   [place] LIMITED JOINT SPACE STRUCTURES UP RIGHT(NO LINKED CHARGES)    Order Specific Question:   Reason for Exam (SYMPTOM  OR DIAGNOSIS REQUIRED)    Answer:   R shoulder pain    Order Specific Question:   Preferred imaging location?    Answer:   [facility]   MR SHOULDER RIGHT WO CONTRAST    Standing Status:   Future    Standing Expiration Date:   06/19/2023    Order Specific Question:   What is the patient's sedation requirement?    Answer:   No Sedation    Order Specific Question:   Does the patient have a pacemaker or implanted devices?    Answer:   No    Order Specific Question:   Preferred imaging location?    Answer:   [facility] (table limit-550lbs)   No orders of the defined types were placed in this encounter.    Discussed warning signs or symptoms. Please see discharge instructions. Patient expresses understanding.   The above documentation has been reviewed and is accurate and complete Lynne Leader, M.D.

## 2022-06-18 ENCOUNTER — Ambulatory Visit (INDEPENDENT_AMBULATORY_CARE_PROVIDER_SITE_OTHER): Payer: BC Managed Care – PPO | Admitting: Family Medicine

## 2022-06-18 ENCOUNTER — Ambulatory Visit: Payer: Self-pay

## 2022-06-18 ENCOUNTER — Encounter: Payer: Self-pay | Admitting: Family Medicine

## 2022-06-18 VITALS — BP 150/90 | HR 82 | Ht 62.0 in | Wt 197.6 lb

## 2022-06-18 DIAGNOSIS — S46011A Strain of muscle(s) and tendon(s) of the rotator cuff of right shoulder, initial encounter: Secondary | ICD-10-CM

## 2022-06-18 DIAGNOSIS — M25511 Pain in right shoulder: Secondary | ICD-10-CM | POA: Diagnosis not present

## 2022-06-18 NOTE — Patient Instructions (Addendum)
Good to see you today.  I've ordered a R shoulder MRI.  That facility will call you to schedule but please let us know if you don't hear from them in one week regarding scheduling.  Follow-up based on MRI results.

## 2022-06-19 ENCOUNTER — Other Ambulatory Visit: Payer: Self-pay | Admitting: Family Medicine

## 2022-06-19 DIAGNOSIS — Z77018 Contact with and (suspected) exposure to other hazardous metals: Secondary | ICD-10-CM

## 2022-06-25 ENCOUNTER — Ambulatory Visit (INDEPENDENT_AMBULATORY_CARE_PROVIDER_SITE_OTHER): Payer: BC Managed Care – PPO

## 2022-06-25 ENCOUNTER — Ambulatory Visit
Admission: RE | Admit: 2022-06-25 | Discharge: 2022-06-25 | Disposition: A | Discharge status: Home / Self Care | Payer: BC Managed Care – PPO | Source: Ambulatory Visit | Attending: Family Medicine | Admitting: Family Medicine

## 2022-06-25 DIAGNOSIS — E538 Deficiency of other specified B group vitamins: Secondary | ICD-10-CM | POA: Diagnosis not present

## 2022-06-25 DIAGNOSIS — Z77018 Contact with and (suspected) exposure to other hazardous metals: Secondary | ICD-10-CM

## 2022-06-25 DIAGNOSIS — Z01818 Encounter for other preprocedural examination: Secondary | ICD-10-CM | POA: Diagnosis not present

## 2022-06-25 NOTE — Progress Notes (Signed)
Pt here for monthly B12 injection per Dr Jerilee Hoh.  B12 1050mg given IM right deltoid and pt tolerated injection well.  Next B12 injection scheduled for 07/27/22.

## 2022-07-01 ENCOUNTER — Ambulatory Visit
Admission: RE | Admit: 2022-07-01 | Discharge: 2022-07-01 | Disposition: A | Discharge status: Home / Self Care | Payer: BC Managed Care – PPO | Source: Ambulatory Visit | Attending: Family Medicine | Admitting: Family Medicine

## 2022-07-01 DIAGNOSIS — M25511 Pain in right shoulder: Secondary | ICD-10-CM

## 2022-07-01 DIAGNOSIS — S46011A Strain of muscle(s) and tendon(s) of the rotator cuff of right shoulder, initial encounter: Secondary | ICD-10-CM

## 2022-07-03 ENCOUNTER — Telehealth: Payer: Self-pay | Admitting: Family Medicine

## 2022-07-03 DIAGNOSIS — S46011A Strain of muscle(s) and tendon(s) of the rotator cuff of right shoulder, initial encounter: Secondary | ICD-10-CM

## 2022-07-03 NOTE — Progress Notes (Signed)
MRI shows significant rotator cuff tear of the right shoulder.  This is likely what is going to require surgery.  I have referred you to an orthopedic surgeon who does a lot of shoulder surgeries to talk about surgical options for this.  I think if you do not have surgery for this this can be very hard for your shoulder to work normally again.  You should hear from Dr. Eddie Dibbles offiice soon.

## 2022-07-03 NOTE — Telephone Encounter (Signed)
Orthopedic surgery referral placed today. 

## 2022-07-12 ENCOUNTER — Other Ambulatory Visit: Payer: Self-pay | Admitting: Internal Medicine

## 2022-07-13 ENCOUNTER — Ambulatory Visit (INDEPENDENT_AMBULATORY_CARE_PROVIDER_SITE_OTHER): Payer: BC Managed Care – PPO | Admitting: Orthopedic Surgery

## 2022-07-13 DIAGNOSIS — M75121 Complete rotator cuff tear or rupture of right shoulder, not specified as traumatic: Secondary | ICD-10-CM

## 2022-07-19 ENCOUNTER — Encounter: Payer: Self-pay | Admitting: Orthopedic Surgery

## 2022-07-19 NOTE — Progress Notes (Signed)
Office Visit Note   Patient: Katie Morse           Date of Birth: [birth year]           MRN: [id number] Visit Date: 07/13/2022 Requested by: Isaac Bliss, Rayford Halsted, MD [place],  [place] [address] PCP: Isaac Bliss, Rayford Halsted, MD  Subjective: Chief Complaint  Patient presents with   Right Shoulder - Pain    HPI: Patient presents for evaluation of right shoulder pain.  She has had pain since January but the pain became worse and she fell in April.  She is right-hand dominant.  The pain wakes her from sleep at night.  Asked to assist her arm with certain motions which are more painful particularly overhead.  Sleeps with a pillow under her arm.  She works at [employer] which is physical work.  Taking Flexeril as well as Tylenol arthritis and Aleve for her symptoms.  Has a history of diabetes with her last hemoglobin A1c 7.4 in March.  MRI scan is reviewed of the right shoulder.  This shows complete tear of the supraspinatus tendon with 3.7 cm of retraction along with infraspinatus tendon tear as well.  Subscap intact.  No muscle atrophy.              ROS: All systems reviewed are negative as they relate to the chief complaint within the history of present illness.  Patient denies  fevers or chills.   Assessment & Plan: Visit Diagnoses:  1. Complete tear of right rotator cuff, unspecified whether traumatic     Plan: Impression is massive right shoulder rotator cuff tear.  May or may not be repairable in primary fashion.  Surgical plan at this time because of her young age and demanding work would be an attempt at rotator cuff repair.  There is some high-grade partial-thickness cartilage loss as well which could be contributing to her pain symptoms.  That would likely not be addressed with this particular surgery.  If repair is not possible we would proceed with lower trapezius tendon transfer.  I could restore more of the force couple back to the arm to  allow her to delay need for reverse shoulder replacement.  The risk and benefits of each of these procedures are discussed with the patient including not limited to infection or vessel damage the very long rehabilitative process required for each of the surgical options.  Would also do biceps tenodesis at the time.  Patient understands the risk and benefits.  We will plan for surgery sometime in the near future.  All questions answered.  I do want to get her fitted for a abduction external rotation brace just in case we have to do the tendon transfer.  Follow-Up Instructions: No follow-ups on file.   Orders:  No orders of the defined types were placed in this encounter.  No orders of the defined types were placed in this encounter.     Procedures: No procedures performed   Clinical Data: No additional findings.  Objective: Vital Signs: LMP 07/04/2014 Comment: GYN  Physical Exam:   Constitutional: Patient appears well-developed HEENT:  Head: Normocephalic Eyes:EOM are normal Neck: Normal range of motion Cardiovascular: Normal rate Pulmonary/chest: Effort normal Neurologic: Patient is alert Skin: Skin is warm Psychiatric: Patient has normal mood and affect   Ortho Exam: Ortho exam demonstrates passive range of motion on the left of 80/110/180.  On the right 60/105/170.  She does have weakness to infraspinatus  and supraspinatus testing on the right compared to the left.  No Popeye deformity.  Deltoid is functional.  Subscap strength 5+ out of 5 bilaterally.  No masses lymphadenopathy or skin changes noted in that right shoulder girdle region.  Forward flexion and abduction strength gets her just to about 90 degrees.  Specialty Comments:  No specialty comments available.  Imaging: No results found.   PMFS History: Patient Active Problem List   Diagnosis Date Noted   Vitamin B12 deficiency 08/13/2021   Chronic pain of both knees 04/30/2020   Vitamin D deficiency 09/22/2019    Chest pain 08/03/2019   Chronic left shoulder pain 02/22/2019   Neck pain 02/22/2019   Hyperlipidemia associated with type 2 diabetes mellitus ([place]) 02/17/2019   Morbid obesity ([place]) 02/17/2019   DM (diabetes mellitus), type 2 ([place]) 08/04/2016   Obesity, unspecified 09/05/2013   LEIOMYOMA, UTERUS 08/20/2008   SICKLE-CELL TRAIT 08/20/2008   Essential hypertension 08/20/2008   Past Medical History:  Diagnosis Date   ALLERGIC RHINITIS 08/20/2008   Allergy    Anemia    Blood transfusion without reported diagnosis 2011   post surgery   Diabetes mellitus without complication ([place])    DYSPNEA 11/12/2008   Headache(784.0) 08/20/2008   HYPERTENSION 08/20/2008   LEIOMYOMA, UTERUS 08/20/2008   PALPITATIONS, RECURRENT 11/12/2008   Sickle-cell trait ([place]) 7/65/4650   SYSTOLIC MURMUR 35/46/5681    Family History  Problem Relation Age of Onset   Hypertension Mother    Heart Problems Mother    Cancer Father        lung and prostate ca   Diabetes Other    Breast cancer Cousin        2-twins   Colon cancer Neg Hx     Past Surgical History:  Procedure Laterality Date   LIPOMA EXCISION Left 07/19/2019   Procedure: EXCISION OF SUBCUTANEOUS LIPOMA LEFT BUTTOCK;  Surgeon: Donnie Mesa, MD;  Location: [facility];  Service: General;  Laterality: Left;   MYOMECTOMY     fibroids   Social History   Occupational History   Not on file  Tobacco Use   Smoking status: Never   Smokeless tobacco: Never  Vaping Use   Vaping Use: Never used  Substance and Sexual Activity   Alcohol use: Yes    Alcohol/week: 0.0 standard drinks of alcohol    Comment: rarely   Drug use: No   Sexual activity: Not on file

## 2022-07-22 ENCOUNTER — Telehealth: Payer: Self-pay | Admitting: Orthopedic Surgery

## 2022-07-22 NOTE — Telephone Encounter (Signed)
Patient is scheduled for right shoulder arthroscopy, debridement, biceps tenodesis, possible mini open rotator cuff tear repair VS lower trapezius tendon transfer on 08-13-22 at [facility].  She would like to get an idea of her downtime, when she will be able to drive, return to work (part-time at [employer]), when she will be able to shower.   She would also like a note for her employer (to whom it may concern) giving them an idea of when she can return.

## 2022-07-22 NOTE — Telephone Encounter (Signed)
This is really hard to say.  Really depends on what we do but I would say the minimum time for driving and returning to work would be 2-1/2 to 3 months at the earliest.  Again whether or not we do that tendon transfer versus repair of the rotator cuff will make the difference in terms of her return to work.  Okay to shower first postop day because the dressing will be waterproof.

## 2022-07-22 NOTE — Telephone Encounter (Signed)
I have not tried to call patient. Waiting on response from Dr Camelia Phenes contact patient once advised by Dr Marlou Sa.

## 2022-07-22 NOTE — Telephone Encounter (Signed)
Phone call

## 2022-07-22 NOTE — Telephone Encounter (Signed)
done

## 2022-07-22 NOTE — Telephone Encounter (Signed)
Patient called returning phone and would like another call back if possible. CB # [phone]

## 2022-07-27 ENCOUNTER — Ambulatory Visit: Payer: BC Managed Care – PPO

## 2022-07-27 ENCOUNTER — Ambulatory Visit (INDEPENDENT_AMBULATORY_CARE_PROVIDER_SITE_OTHER): Payer: BC Managed Care – PPO

## 2022-07-27 DIAGNOSIS — E538 Deficiency of other specified B group vitamins: Secondary | ICD-10-CM

## 2022-07-27 DIAGNOSIS — H25013 Cortical age-related cataract, bilateral: Secondary | ICD-10-CM | POA: Diagnosis not present

## 2022-07-27 DIAGNOSIS — I10 Essential (primary) hypertension: Secondary | ICD-10-CM | POA: Diagnosis not present

## 2022-07-27 DIAGNOSIS — H2512 Age-related nuclear cataract, left eye: Secondary | ICD-10-CM | POA: Diagnosis not present

## 2022-07-27 DIAGNOSIS — H25043 Posterior subcapsular polar age-related cataract, bilateral: Secondary | ICD-10-CM | POA: Diagnosis not present

## 2022-07-27 DIAGNOSIS — H2513 Age-related nuclear cataract, bilateral: Secondary | ICD-10-CM | POA: Diagnosis not present

## 2022-07-27 NOTE — Progress Notes (Signed)
Pt here for monthly B12 injection per Dr Jerilee Hoh.  B12 1061mg given IM left deltoid and pt tolerated injection well.  Next B12 injection scheduled for 09/01/22 during CPE.

## 2022-07-29 ENCOUNTER — Other Ambulatory Visit: Payer: Self-pay

## 2022-08-04 NOTE — Pre-Procedure Instructions (Addendum)
Surgical Instructions    Your procedure is scheduled on Thursday, August 24th.  Report to [facility] Main Entrance "A" at 5:30 A.M., then check in with the Admitting office.  Call this number if you have problems the morning of surgery:  [phone]   If you have any questions prior to your surgery date call [phone]: Open Monday-Friday 8am-4pm    Remember:  Do not eat after midnight the night before your surgery  You may drink clear liquids until 4:30 a.m. the morning of your surgery.   Clear liquids allowed are: Water, Non-Citrus Juices (without pulp), Carbonated Beverages, Clear Tea, Black Coffee ONLY (NO MILK, CREAM OR POWDERED CREAMER of any kind), and Gatorade.  Patient Instructions   The day of surgery (if you have diabetes):  Drink ONE small 12 oz bottle of Gatorade G2 by 4:30 am the morning of surgery This bottle was given to you during your hospital  pre-op appointment visit.  Nothing else to drink after completing the  Small 12 oz bottle of Gatorade G2.         If you have questions, please contact your surgeon's office.     Take these medicines the morning of surgery with A SIP OF WATER:  amLODipine (NORVASC)   Take these medications as needed: acetaminophen (TYLENOL)  albuterol (VENTOLIN HFA)- Please bring all inhalers with you the day of surgery.  cyclobenzaprine (FLEXERIL)  fluticasone (FLONASE) Eye drops  Follow your surgeon's instructions on when to stop Aspirin.  If no instructions were given by your surgeon then you will need to call the office to get those instructions.    As of today, STOP taking any Aleve, Naproxen, Ibuprofen, Motrin, Advil, Goody's, BC's, all herbal medications, fish oil, and all vitamins. This includes: diclofenac (VOLTAREN).   WHAT DO I DO ABOUT MY DIABETES MEDICATION?   Do not take metFORMIN (GLUCOPHAGE) the morning of surgery.   HOW TO MANAGE YOUR DIABETES BEFORE AND AFTER SURGERY  Why is it important to control my  blood sugar before and after surgery? Improving blood sugar levels before and after surgery helps healing and can limit problems. A way of improving blood sugar control is eating a healthy diet by:  Eating less sugar and carbohydrates  Increasing activity/exercise  Talking with your doctor about reaching your blood sugar goals High blood sugars (greater than 180 mg/dL) can raise your risk of infections and slow your recovery, so you will need to focus on controlling your diabetes during the weeks before surgery. Make sure that the doctor who takes care of your diabetes knows about your planned surgery including the date and location.  How do I manage my blood sugar before surgery? Check your blood sugar at least 4 times a day, starting 2 days before surgery, to make sure that the level is not too high or low.  Check your blood sugar the morning of your surgery when you wake up and every 2 hours until you get to the Short Stay unit.  If your blood sugar is less than 70 mg/dL, you will need to treat for low blood sugar: Do not take insulin. Treat a low blood sugar (less than 70 mg/dL) with  cup of clear juice (cranberry or apple), 4 glucose tablets, OR glucose gel. Recheck blood sugar in 15 minutes after treatment (to make sure it is greater than 70 mg/dL). If your blood sugar is not greater than 70 mg/dL on recheck, call [phone] for further instructions. Report your blood sugar  to the short stay nurse when you get to Short Stay.  If you are admitted to the hospital after surgery: Your blood sugar will be checked by the staff and you will probably be given insulin after surgery (instead of oral diabetes medicines) to make sure you have good blood sugar levels. The goal for blood sugar control after surgery is 80-180 mg/dL.           Do not wear jewelry or makeup. Do not wear lotions, powders, perfumes or deodorant. Do not shave 48 hours prior to surgery.  Men may shave face and  neck. Do not bring valuables to the hospital. Do not wear nail polish, gel polish, artificial nails, or any other type of covering on natural nails (fingers and toes) If you have artificial nails or gel coating that need to be removed by a nail salon, please have this removed prior to surgery. Artificial nails or gel coating may interfere with anesthesia's ability to adequately monitor your vital signs.  [facility] is not responsible for any belongings or valuables.    Do NOT Smoke (Tobacco/Vaping)  24 hours prior to your procedure  If you use a CPAP at night, you may bring your mask for your overnight stay.   Contacts, glasses, hearing aids, dentures or partials may not be worn into surgery, please bring cases for these belongings   For patients admitted to the hospital, discharge time will be determined by your treatment team.   Patients discharged the day of surgery will not be allowed to drive home, and someone needs to stay with them for 24 hours.   SURGICAL WAITING ROOM VISITATION Patients having surgery or a procedure may have no more than 2 support people in the waiting area - these visitors may rotate.   Children under the age of 16 must have an adult with them who is not the patient. If the patient needs to stay at the hospital during part of their recovery, the visitor guidelines for inpatient rooms apply. Pre-op nurse will coordinate an appropriate time for 1 support person to accompany patient in pre-op.  This support person may not rotate.   Please refer to the [facility] website for the visitor guidelines for Inpatients (after your surgery is over and you are in a regular room).    Special instructions:    Oral Hygiene is also important to reduce your risk of infection.  Remember - BRUSH YOUR TEETH THE MORNING OF SURGERY WITH YOUR REGULAR TOOTHPASTE   [facility]- Preparing For Surgery  Before surgery, you can play an important role. Because skin is not sterile, your  skin needs to be as free of germs as possible. You can reduce the number of germs on your skin by washing with CHG (chlorahexidine gluconate) Soap before surgery.  CHG is an antiseptic cleaner which kills germs and bonds with the skin to continue killing germs even after washing.     Please do not use if you have an allergy to CHG or antibacterial soaps. If your skin becomes reddened/irritated stop using the CHG.  Do not shave (including legs and underarms) for at least 48 hours prior to first CHG shower. It is OK to shave your face.  Please follow these instructions carefully.     Shower the NIGHT BEFORE SURGERY and the MORNING OF SURGERY with CHG Soap.   If you chose to wash your hair, wash your hair first as usual with your normal shampoo. After you shampoo, rinse your  hair and body thoroughly to remove the shampoo.  Then [employer] and genitals (private parts) with your normal soap and rinse thoroughly to remove soap.  After that Use CHG Soap as you would any other liquid soap. You can apply CHG directly to the skin and wash gently with a scrungie or a clean washcloth.   Apply the CHG Soap to your body ONLY FROM THE NECK DOWN.  Do not use on open wounds or open sores. Avoid contact with your eyes, ears, mouth and genitals (private parts). Wash Face and genitals (private parts)  with your normal soap.   Wash thoroughly, paying special attention to the area where your surgery will be performed.  Thoroughly rinse your body with warm water from the neck down.  DO NOT shower/wash with your normal soap after using and rinsing off the CHG Soap.  Pat yourself dry with a CLEAN TOWEL.  Wear CLEAN PAJAMAS to bed the night before surgery  Place CLEAN SHEETS on your bed the night before your surgery  DO NOT SLEEP WITH PETS.   Day of Surgery:  Take a shower with CHG soap. Wear Clean/Comfortable clothing the morning of surgery Do not apply any deodorants/lotions.   Remember to brush your  teeth WITH YOUR REGULAR TOOTHPASTE.    If you received a COVID test during your pre-op visit, it is requested that you wear a mask when out in public, stay away from anyone that may not be feeling well, and notify your surgeon if you develop symptoms. If you have been in contact with anyone that has tested positive in the last 10 days, please notify your surgeon.    Please read over the following fact sheets that you were given.

## 2022-08-05 ENCOUNTER — Other Ambulatory Visit: Payer: Self-pay

## 2022-08-05 ENCOUNTER — Other Ambulatory Visit (HOSPITAL_COMMUNITY): Payer: BC Managed Care – PPO

## 2022-08-05 ENCOUNTER — Encounter (HOSPITAL_COMMUNITY): Payer: Self-pay

## 2022-08-05 ENCOUNTER — Encounter (HOSPITAL_COMMUNITY)
Admission: RE | Admit: 2022-08-05 | Discharge: 2022-08-05 | Disposition: A | Discharge status: Home / Self Care | Payer: BC Managed Care – PPO | Source: Ambulatory Visit | Attending: Orthopedic Surgery | Admitting: Orthopedic Surgery

## 2022-08-05 VITALS — BP 143/64 | HR 65 | Temp 97.5°F | Resp 18 | Ht 62.5 in | Wt 197.8 lb

## 2022-08-05 DIAGNOSIS — Z01818 Encounter for other preprocedural examination: Secondary | ICD-10-CM | POA: Insufficient documentation

## 2022-08-05 DIAGNOSIS — R7303 Prediabetes: Secondary | ICD-10-CM | POA: Insufficient documentation

## 2022-08-05 DIAGNOSIS — I2510 Atherosclerotic heart disease of native coronary artery without angina pectoris: Secondary | ICD-10-CM | POA: Diagnosis not present

## 2022-08-05 DIAGNOSIS — E538 Deficiency of other specified B group vitamins: Secondary | ICD-10-CM

## 2022-08-05 HISTORY — DX: Unspecified osteoarthritis, unspecified site: M19.90

## 2022-08-05 HISTORY — DX: Cardiac arrhythmia, unspecified: I49.9

## 2022-08-05 LAB — CBC
HCT: 36.5 % (ref 36.0–46.0)
Hemoglobin: 11.9 g/dL — ABNORMAL LOW (ref 12.0–15.0)
MCH: 26.7 pg (ref 26.0–34.0)
MCHC: 32.6 g/dL (ref 30.0–36.0)
MCV: 81.8 fL (ref 80.0–100.0)
Platelets: 256 10*3/uL (ref 150–400)
RBC: 4.46 MIL/uL (ref 3.87–5.11)
RDW: 13.2 % (ref 11.5–15.5)
WBC: 4.2 10*3/uL (ref 4.0–10.5)
nRBC: 0 % (ref 0.0–0.2)

## 2022-08-05 LAB — BASIC METABOLIC PANEL
Anion gap: 8 (ref 5–15)
BUN: 9 mg/dL (ref 6–20)
CO2: 25 mmol/L (ref 22–32)
Calcium: 9.5 mg/dL (ref 8.9–10.3)
Chloride: 109 mmol/L (ref 98–111)
Creatinine, Ser: 0.64 mg/dL (ref 0.44–1.00)
GFR, Estimated: >60 mL/min (ref >60)
Glucose, Bld: 149 mg/dL — ABNORMAL HIGH (ref 70–99)
Potassium: 3.8 mmol/L (ref 3.5–5.1)
Sodium: 142 mmol/L (ref 135–145)

## 2022-08-05 LAB — HEMOGLOBIN A1C
Hgb A1c MFr Bld: 8.1 % — ABNORMAL HIGH (ref 4.8–5.6)
Mean Plasma Glucose: 185.77 mg/dL

## 2022-08-05 LAB — GLUCOSE, CAPILLARY: Glucose-Capillary: 150 mg/dL — ABNORMAL HIGH (ref 70–99)

## 2022-08-05 NOTE — Progress Notes (Addendum)
PCP - Leotis Shames, MD Cardiologist - Dr. Bettina Gavia  PPM/ICD - Denies Device Orders - n/a Rep Notified - n/a  Chest x-ray - n/a EKG - 08/05/2022 Stress Test - 06/02/2019 ECHO - 06/03/2019 Cardiac Cath - denies  Sleep Study - denies CPAP - n/a  Pt states she is a pre-diabetic, but is taking Metformin. She does not check her blood sugar at home. CBG 150 at PAT appointment and she had not had anything to eat or drink prior to appointment.  Blood Thinner Instructions: n/a Aspirin Instructions: Pt instructed to call Dr. Randel Pigg office after PAT appointment to receive instructions on when to stop taking ASA.  ERAS Protcol - Yes. Clear Liquids until 0430 morning of surgery PRE-SURGERY Ensure or G2- G2 given to patient at PAT appointment  COVID TEST- n/a   Anesthesia review: Yes. Cardiac History. Hgb A1c drawn at PAT visit resulted 8.1  Patient denies shortness of breath, fever, cough and chest pain at PAT appointment   All instructions explained to the patient, with a verbal understanding of the material. Patient agrees to go over the instructions while at home for a better understanding. Patient also instructed to self quarantine after being tested for COVID-19. The opportunity to ask questions was provided.

## 2022-08-06 ENCOUNTER — Telehealth: Payer: Self-pay | Admitting: Orthopedic Surgery

## 2022-08-06 NOTE — Anesthesia Preprocedure Evaluation (Addendum)
Anesthesia Evaluation  Patient identified by MRN, date of birth, ID band Patient awake    Reviewed: Allergy & Precautions, NPO status , Patient's Chart, lab work & pertinent test results  Airway Mallampati: II  TM Distance: >3 FB     Dental   Pulmonary    breath sounds clear to auscultation       Cardiovascular hypertension, (-) dysrhythmias + Valvular Problems/Murmurs  Rhythm:Regular Rate:Normal  Hx noted Dr. [name]   Neuro/Psych    GI/Hepatic negative GI ROS, Neg liver ROS,   Endo/Other  diabetes  Renal/GU negative Renal ROS     Musculoskeletal   Abdominal   Peds  Hematology   Anesthesia Other Findings   Reproductive/Obstetrics                            Anesthesia Physical Anesthesia Plan  ASA: 3  Anesthesia Plan: General   Post-op Pain Management: Regional block*   Induction: Intravenous  PONV Risk Score and Plan: 3 and Ondansetron, Dexamethasone and Midazolam  Airway Management Planned: Oral ETT  Additional Equipment:   Intra-op Plan:   Post-operative Plan: Extubation in OR  Informed Consent: I have reviewed the patients History and Physical, chart, labs and discussed the procedure including the risks, benefits and alternatives for the proposed anesthesia with the patient or authorized representative who has indicated his/her understanding and acceptance.     Dental advisory given  Plan Discussed with:   Anesthesia Plan Comments: (PAT note by Karoline Caldwell, PA-C: Previously evaluated by cardiology in 2020 for chest pain.  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  Last seen by Dr. 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  He was advised to use NSAIDs as needed for similar symptoms in the future.  She was advised to follow-up with  cardiology on an as-needed basis.  Non-insulin-dependent DM2, A1c 8.1 on preop labs.  Remainder of preop labs unremarkable.  EKG 08/05/2022: NSR.  Rate 64.  Coronary CT 08/18/2019: IMPRESSION: 1. No evidence of CAD, CADRADS = 0. 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  2. Coronary calcium score of 0. This was 0 percentile for age and sex matched control.  3. Normal coronary origin with right dominance.  )       Anesthesia Quick Evaluation

## 2022-08-06 NOTE — Telephone Encounter (Signed)
Patient called needing to know when should she stop taking Aspirin? The number to contact patient is [phone]

## 2022-08-06 NOTE — Progress Notes (Signed)
Anesthesia Chart Review:  Previously evaluated by cardiology in 2020 for chest pain.  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  Last seen by Dr. 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  He was advised to use NSAIDs as needed for similar symptoms in the future.  She was advised to follow-up with cardiology on an as-needed basis.  Non-insulin-dependent DM2, A1c 8.1 on preop labs.  Remainder of preop labs unremarkable.  EKG 08/05/2022: NSR.  Rate 64.  Coronary CT 08/18/2019: IMPRESSION: 1. No evidence of CAD, CADRADS = 0. 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   2. Coronary calcium score of 0. This was 0 percentile for age and sex matched control.   3. Normal coronary origin with right dominance.    Wynonia Musty [facility] Short Stay Center/Anesthesiology Phone [phone] 08/06/2022 4:28 PM

## 2022-08-07 NOTE — Telephone Encounter (Signed)
IC advised per Dr Marlou Sa to d/c 5 days prior to surgery.

## 2022-08-13 ENCOUNTER — Ambulatory Visit (HOSPITAL_COMMUNITY): Payer: BC Managed Care – PPO | Admitting: Certified Registered"

## 2022-08-13 ENCOUNTER — Encounter (HOSPITAL_COMMUNITY): Payer: Self-pay | Admitting: Orthopedic Surgery

## 2022-08-13 ENCOUNTER — Other Ambulatory Visit: Payer: Self-pay

## 2022-08-13 ENCOUNTER — Ambulatory Visit (HOSPITAL_COMMUNITY)
Admission: RE | Admit: 2022-08-13 | Discharge: 2022-08-13 | Disposition: A | Discharge status: Home / Self Care | Payer: BC Managed Care – PPO | Source: Ambulatory Visit | Attending: Orthopedic Surgery | Admitting: Orthopedic Surgery

## 2022-08-13 ENCOUNTER — Ambulatory Visit (HOSPITAL_COMMUNITY): Payer: BC Managed Care – PPO | Admitting: Physician Assistant

## 2022-08-13 ENCOUNTER — Encounter (HOSPITAL_COMMUNITY)
Admission: RE | Disposition: A | Discharge status: Home / Self Care | Payer: Self-pay | Source: Ambulatory Visit | Attending: Orthopedic Surgery

## 2022-08-13 DIAGNOSIS — S43431A Superior glenoid labrum lesion of right shoulder, initial encounter: Secondary | ICD-10-CM | POA: Diagnosis not present

## 2022-08-13 DIAGNOSIS — S46011A Strain of muscle(s) and tendon(s) of the rotator cuff of right shoulder, initial encounter: Secondary | ICD-10-CM | POA: Insufficient documentation

## 2022-08-13 DIAGNOSIS — W19XXXA Unspecified fall, initial encounter: Secondary | ICD-10-CM | POA: Insufficient documentation

## 2022-08-13 DIAGNOSIS — R7303 Prediabetes: Secondary | ICD-10-CM

## 2022-08-13 DIAGNOSIS — M75101 Unspecified rotator cuff tear or rupture of right shoulder, not specified as traumatic: Secondary | ICD-10-CM | POA: Diagnosis not present

## 2022-08-13 DIAGNOSIS — E119 Type 2 diabetes mellitus without complications: Secondary | ICD-10-CM | POA: Diagnosis not present

## 2022-08-13 DIAGNOSIS — M7521 Bicipital tendinitis, right shoulder: Secondary | ICD-10-CM

## 2022-08-13 DIAGNOSIS — Z01818 Encounter for other preprocedural examination: Secondary | ICD-10-CM

## 2022-08-13 DIAGNOSIS — M75121 Complete rotator cuff tear or rupture of right shoulder, not specified as traumatic: Secondary | ICD-10-CM | POA: Diagnosis not present

## 2022-08-13 DIAGNOSIS — G8918 Other acute postprocedural pain: Secondary | ICD-10-CM | POA: Diagnosis not present

## 2022-08-13 DIAGNOSIS — I10 Essential (primary) hypertension: Secondary | ICD-10-CM | POA: Diagnosis not present

## 2022-08-13 HISTORY — PX: SHOULDER ARTHROSCOPY WITH OPEN ROTATOR CUFF REPAIR AND DISTAL CLAVICLE ACROMINECTOMY: SHX5683

## 2022-08-13 LAB — GLUCOSE, CAPILLARY
Glucose-Capillary: 172 mg/dL — ABNORMAL HIGH (ref 70–99)
Glucose-Capillary: 174 mg/dL — ABNORMAL HIGH (ref 70–99)
Glucose-Capillary: 149 mg/dL — ABNORMAL HIGH (ref 70–99)

## 2022-08-13 SURGERY — SHOULDER ARTHROSCOPY WITH OPEN ROTATOR CUFF REPAIR AND DISTAL CLAVICLE ACROMINECTOMY
Anesthesia: General | Site: Shoulder | Laterality: Right

## 2022-08-13 MED ORDER — CEFAZOLIN SODIUM-DEXTROSE 2-4 GM/100ML-% IV SOLN
2.0000 g | INTRAVENOUS | Status: AC
  Administered 2022-08-13: 2 g via INTRAVENOUS
  Filled 2022-08-13: qty 100

## 2022-08-13 MED ORDER — BUPIVACAINE HCL (PF) 0.25 % IJ SOLN
INTRAMUSCULAR | Status: AC
  Filled 2022-08-13: qty 30

## 2022-08-13 MED ORDER — 0.9 % SODIUM CHLORIDE (POUR BTL) OPTIME
TOPICAL | Status: DC | PRN
  Administered 2022-08-13: 1000 mL

## 2022-08-13 MED ORDER — OXYCODONE-ACETAMINOPHEN 5-325 MG PO TABS: 1.0000 | ORAL_TABLET | ORAL | 0 refills | Status: DC | PRN

## 2022-08-13 MED ORDER — SODIUM CHLORIDE (PF) 0.9 % IJ SOLN
INTRAMUSCULAR | Status: AC
  Filled 2022-08-13: qty 50

## 2022-08-13 MED ORDER — ORAL CARE MOUTH RINSE: 15.0000 mL | Freq: Once | OROMUCOSAL | Status: AC

## 2022-08-13 MED ORDER — TRANEXAMIC ACID-NACL 1000-0.7 MG/100ML-% IV SOLN
INTRAVENOUS | Status: AC
  Filled 2022-08-13: qty 100

## 2022-08-13 MED ORDER — FENTANYL CITRATE (PF) 250 MCG/5ML IJ SOLN
INTRAMUSCULAR | Status: DC | PRN
  Administered 2022-08-13 (×3): 50 ug via INTRAVENOUS

## 2022-08-13 MED ORDER — BUPIVACAINE-EPINEPHRINE (PF) 0.5% -1:200000 IJ SOLN
INTRAMUSCULAR | Status: DC | PRN
  Administered 2022-08-13: 15 mL via PERINEURAL

## 2022-08-13 MED ORDER — POVIDONE-IODINE 7.5 % EX SOLN
Freq: Once | CUTANEOUS | Status: DC
  Filled 2022-08-13: qty 118

## 2022-08-13 MED ORDER — CHLORHEXIDINE GLUCONATE 0.12 % MT SOLN
15.0000 mL | Freq: Once | OROMUCOSAL | Status: AC
Start: 2022-08-13 — End: 2022-08-13
  Administered 2022-08-13: 15 mL via OROMUCOSAL
  Filled 2022-08-13: qty 15

## 2022-08-13 MED ORDER — SUGAMMADEX SODIUM 200 MG/2ML IV SOLN
INTRAVENOUS | Status: DC | PRN
  Administered 2022-08-13: 200 mg via INTRAVENOUS

## 2022-08-13 MED ORDER — METOPROLOL TARTRATE 12.5 MG HALF TABLET
25.0000 mg | ORAL_TABLET | Freq: Once | ORAL | Status: AC
Start: 2022-08-13 — End: 2022-08-13
  Administered 2022-08-13: 25 mg via ORAL
  Filled 2022-08-13: qty 2

## 2022-08-13 MED ORDER — ONDANSETRON HCL 4 MG/2ML IJ SOLN
INTRAMUSCULAR | Status: DC | PRN
  Administered 2022-08-13: 4 mg via INTRAVENOUS

## 2022-08-13 MED ORDER — PHENYLEPHRINE HCL (PRESSORS) 10 MG/ML IV SOLN
INTRAVENOUS | Status: AC
  Filled 2022-08-13: qty 1

## 2022-08-13 MED ORDER — PROPOFOL 10 MG/ML IV BOLUS
INTRAVENOUS | Status: DC | PRN
  Administered 2022-08-13: 150 mg via INTRAVENOUS

## 2022-08-13 MED ORDER — SODIUM CHLORIDE (PF) 0.9 % IJ SOLN: PREFILLED_SYRINGE | INTRAMUSCULAR | Status: DC | PRN

## 2022-08-13 MED ORDER — INSULIN ASPART 100 UNIT/ML IJ SOLN
0.0000 [IU] | INTRAMUSCULAR | Status: DC | PRN
  Administered 2022-08-13: 2 [IU] via SUBCUTANEOUS
  Filled 2022-08-13: qty 1

## 2022-08-13 MED ORDER — HYDROMORPHONE HCL 1 MG/ML IJ SOLN: 0.2500 mg | INTRAMUSCULAR | Status: DC | PRN

## 2022-08-13 MED ORDER — VANCOMYCIN HCL 1000 MG IV SOLR
INTRAVENOUS | Status: DC | PRN
  Administered 2022-08-13: 1000 mg

## 2022-08-13 MED ORDER — LACTATED RINGERS IV SOLN: INTRAVENOUS | Status: DC

## 2022-08-13 MED ORDER — POVIDONE-IODINE 10 % EX SWAB
2.0000 | Freq: Once | CUTANEOUS | Status: AC
  Administered 2022-08-13: 2 via TOPICAL

## 2022-08-13 MED ORDER — PROPOFOL 10 MG/ML IV BOLUS
INTRAVENOUS | Status: AC
  Filled 2022-08-13: qty 20

## 2022-08-13 MED ORDER — EPINEPHRINE PF 1 MG/ML IJ SOLN
INTRAMUSCULAR | Status: AC
Start: 2022-08-13 — End: ?
  Filled 2022-08-13: qty 1

## 2022-08-13 MED ORDER — TRANEXAMIC ACID-NACL 1000-0.7 MG/100ML-% IV SOLN
1000.0000 mg | INTRAVENOUS | Status: AC
  Administered 2022-08-13: 1000 mg via INTRAVENOUS
  Filled 2022-08-13: qty 100

## 2022-08-13 MED ORDER — EPINEPHRINE PF 1 MG/ML IJ SOLN
INTRAMUSCULAR | Status: AC
  Filled 2022-08-13: qty 2

## 2022-08-13 MED ORDER — BUPIVACAINE LIPOSOME 1.3 % IJ SUSP
INTRAMUSCULAR | Status: DC | PRN
  Administered 2022-08-13: 10 mL via PERINEURAL

## 2022-08-13 MED ORDER — LIDOCAINE 2% (20 MG/ML) 5 ML SYRINGE
INTRAMUSCULAR | Status: DC | PRN
  Administered 2022-08-13: 40 mg via INTRAVENOUS

## 2022-08-13 MED ORDER — MIDAZOLAM HCL 2 MG/2ML IJ SOLN
INTRAMUSCULAR | Status: DC | PRN
  Administered 2022-08-13 (×2): 1 mg via INTRAVENOUS

## 2022-08-13 MED ORDER — VANCOMYCIN HCL 1000 MG IV SOLR
INTRAVENOUS | Status: AC
  Filled 2022-08-13: qty 20

## 2022-08-13 MED ORDER — ROCURONIUM BROMIDE 10 MG/ML (PF) SYRINGE
PREFILLED_SYRINGE | INTRAVENOUS | Status: DC | PRN
  Administered 2022-08-13: 20 mg via INTRAVENOUS
  Administered 2022-08-13: 60 mg via INTRAVENOUS
  Administered 2022-08-13: 20 mg via INTRAVENOUS

## 2022-08-13 MED ORDER — PHENYLEPHRINE HCL-NACL 20-0.9 MG/250ML-% IV SOLN
INTRAVENOUS | Status: DC | PRN
  Administered 2022-08-13: 20 ug/min via INTRAVENOUS

## 2022-08-13 MED ORDER — MIDAZOLAM HCL 2 MG/2ML IJ SOLN
INTRAMUSCULAR | Status: AC
  Filled 2022-08-13: qty 2

## 2022-08-13 MED ORDER — FENTANYL CITRATE (PF) 250 MCG/5ML IJ SOLN
INTRAMUSCULAR | Status: AC
  Filled 2022-08-13: qty 5

## 2022-08-13 SURGICAL SUPPLY — 84 items
AID PSTN UNV HD RSTRNT DISP (MISCELLANEOUS) ×1
ANCH SUT 2 FBRTK KNTLS 1.8 (Anchor) ×1 IMPLANT
ANCH SUT 2 SWLK 19.1 CLS EYLT (Anchor) ×3 IMPLANT
ANCH SUT CRKSW FT 1.3X (Anchor) ×3 IMPLANT
ANCH SUT FBRTK 1.3 2 TPE (Anchor) ×3 IMPLANT
ANCHOR FBRTK 2.6 SUTURETAP 1.3 (Anchor) IMPLANT
ANCHOR SUT 1.8 FIBERTAK SB KL (Anchor) IMPLANT
ANCHOR SUT BIOCOMP CORKSREW (Anchor) IMPLANT
ANCHOR SWIVELOCK BIO 4.75X19.1 (Anchor) IMPLANT
BAG COUNTER SPONGE SURGICOUNT (BAG) ×1 IMPLANT
BAG SPNG CNTER NS LX DISP (BAG) ×1
BLADE EXCALIBUR 4.0X13 (MISCELLANEOUS) IMPLANT
BLADE SURG 11 STRL SS (BLADE) ×1 IMPLANT
BLADE SURG 15 STRL LF DISP TIS (BLADE) IMPLANT
BLADE SURG 15 STRL SS (BLADE) ×1
BUR OVAL 6.0 (BURR) IMPLANT
COVER SURGICAL LIGHT HANDLE (MISCELLANEOUS) ×1 IMPLANT
DONJOY ULTRASLING III, BLK, MEDIUM IMPLANT
DRAPE INCISE IOBAN 66X45 STRL (DRAPES) ×2 IMPLANT
DRAPE STERI 35X30 U-POUCH (DRAPES) ×1 IMPLANT
DRAPE U-SHAPE 47X51 STRL (DRAPES) ×2 IMPLANT
DRSG PAD ABDOMINAL 8X10 ST (GAUZE/BANDAGES/DRESSINGS) ×3 IMPLANT
DRSG TEGADERM 4X4.75 (GAUZE/BANDAGES/DRESSINGS) ×4 IMPLANT
DRSG XEROFORM 1X8 (GAUZE/BANDAGES/DRESSINGS) IMPLANT
DURAPREP 26ML APPLICATOR (WOUND CARE) ×1 IMPLANT
DW OUTFLOW CASSETTE/TUBE SET (MISCELLANEOUS) IMPLANT
ELECT REM PT RETURN 9FT ADLT (ELECTROSURGICAL) ×1
ELECTRODE REM PT RTRN 9FT ADLT (ELECTROSURGICAL) ×1 IMPLANT
FILTER STRAW FLUID ASPIR (MISCELLANEOUS) ×1 IMPLANT
GAUZE SPONGE 4X4 12PLY STRL (GAUZE/BANDAGES/DRESSINGS) IMPLANT
GAUZE SPONGE 4X4 12PLY STRL LF (GAUZE/BANDAGES/DRESSINGS) ×1 IMPLANT
GAUZE XEROFORM 1X8 LF (GAUZE/BANDAGES/DRESSINGS) ×1 IMPLANT
GLOVE BIOGEL PI IND STRL 8 (GLOVE) ×1 IMPLANT
GLOVE BIOGEL PI INDICATOR 8 (GLOVE) ×1
GLOVE ECLIPSE 8.0 STRL XLNG CF (GLOVE) ×1 IMPLANT
GOWN STRL REUS W/ TWL LRG LVL3 (GOWN DISPOSABLE) ×3 IMPLANT
GOWN STRL REUS W/TWL LRG LVL3 (GOWN DISPOSABLE) ×3
KIT BASIN OR (CUSTOM PROCEDURE TRAY) ×1 IMPLANT
KIT STR SPEAR 1.8 FBRTK DISP (KITS) IMPLANT
KIT TURNOVER KIT B (KITS) ×1 IMPLANT
MANIFOLD NEPTUNE II (INSTRUMENTS) ×1 IMPLANT
NDL HYPO 25X1 1.5 SAFETY (NEEDLE) ×1 IMPLANT
NDL SCORPION MULTI FIRE (NEEDLE) IMPLANT
NDL SPNL 18GX3.5 QUINCKE PK (NEEDLE) ×1 IMPLANT
NDL SUT 6 .5 CRC .975X.05 MAYO (NEEDLE) IMPLANT
NEEDLE HYPO 25X1 1.5 SAFETY (NEEDLE) ×1 IMPLANT
NEEDLE MAYO TAPER (NEEDLE)
NEEDLE SCORPION MULTI FIRE (NEEDLE) ×1 IMPLANT
NEEDLE SPNL 18GX3.5 QUINCKE PK (NEEDLE) ×1 IMPLANT
NS IRRIG 1000ML POUR BTL (IV SOLUTION) ×1 IMPLANT
PACK SHOULDER (CUSTOM PROCEDURE TRAY) ×1 IMPLANT
PAD ARMBOARD 7.5X6 YLW CONV (MISCELLANEOUS) ×2 IMPLANT
PENCIL BUTTON HOLSTER BLD 10FT (ELECTRODE) IMPLANT
PORT APPOLLO RF 90DEGREE MULTI (SURGICAL WAND) IMPLANT
RESTRAINT HEAD UNIVERSAL NS (MISCELLANEOUS) ×1 IMPLANT
SLING ARM IMMOBILIZER LRG (SOFTGOODS) IMPLANT
SLING ARM IMMOBILIZER MED (SOFTGOODS) IMPLANT
SLING ARM ULTRA III XL (SLING) IMPLANT
SPONGE T-LAP 4X18 ~~LOC~~+RFID (SPONGE) ×2 IMPLANT
STRIP CLOSURE SKIN 1/2X4 (GAUZE/BANDAGES/DRESSINGS) ×1 IMPLANT
SUCTION FRAZIER HANDLE 10FR (MISCELLANEOUS) ×1
SUCTION TUBE FRAZIER 10FR DISP (MISCELLANEOUS) ×1 IMPLANT
SUT ETHILON 3 0 PS 1 (SUTURE) ×1 IMPLANT
SUT FIBERWIRE #2 38 T-5 BLUE (SUTURE) ×1
SUT MNCRL AB 3-0 PS2 18 (SUTURE) ×1 IMPLANT
SUT VIC AB 0 CT1 27 (SUTURE) ×1
SUT VIC AB 0 CT1 27XBRD ANBCTR (SUTURE) ×1 IMPLANT
SUT VIC AB 1 CT1 27 (SUTURE) ×2
SUT VIC AB 1 CT1 27XBRD ANBCTR (SUTURE) ×1 IMPLANT
SUT VIC AB 1 CT1 36 (SUTURE) IMPLANT
SUT VIC AB 2-0 CT1 27 (SUTURE) ×2
SUT VIC AB 2-0 CT1 TAPERPNT 27 (SUTURE) ×1 IMPLANT
SUT VICRYL 0 UR6 27IN ABS (SUTURE) IMPLANT
SUTURE FIBERWR #2 38 T-5 BLUE (SUTURE) IMPLANT
SYR 20ML LL LF (SYRINGE) ×2 IMPLANT
SYR 3ML LL SCALE MARK (SYRINGE) ×1 IMPLANT
SYR TB 1ML LUER SLIP (SYRINGE) ×1 IMPLANT
SYS FBRTK BUTTON 2.6 (Anchor) ×1 IMPLANT
SYSTEM FBRTK BUTTON 2.6 (Anchor) IMPLANT
TOWEL GREEN STERILE (TOWEL DISPOSABLE) ×1 IMPLANT
TOWEL GREEN STERILE FF (TOWEL DISPOSABLE) ×1 IMPLANT
TUBING ARTHROSCOPY IRRIG 16FT (MISCELLANEOUS) ×1 IMPLANT
WATER STERILE IRR 1000ML POUR (IV SOLUTION) ×1 IMPLANT
YANKAUER SUCT BULB TIP NO VENT (SUCTIONS) IMPLANT

## 2022-08-13 NOTE — Anesthesia Postprocedure Evaluation (Signed)
Anesthesia Post Note  Patient: Katie Morse  Procedure(s) Performed: right shoulder arthroscopy, debridement, biceps tenodesis, mini open rotator cuff tear repair (Right: Shoulder)     Patient location during evaluation: PACU Anesthesia Type: General and Regional Level of consciousness: awake Pain management: pain level controlled Vital Signs Assessment: post-procedure vital signs reviewed and stable Respiratory status: spontaneous breathing Cardiovascular status: stable Postop Assessment: no apparent nausea or vomiting Anesthetic complications: no   No notable events documented.  Last Vitals:  Vitals:   08/13/22 1150 08/13/22 1157  BP: 117/62 117/62  Pulse: 72   Resp: 17   Temp:    SpO2: 91% 94%    Last Pain:  Vitals:   08/13/22 1157  TempSrc:   PainSc: 0-No pain                 Keneisha Heckart

## 2022-08-13 NOTE — Progress Notes (Signed)
BP elevated in short stay 181/108. Per Dr. [name] verbal order patient received Metoprolol 25 mg po (patient didn't take at home her regular dose of metoprolol). Will continue to monitor.

## 2022-08-13 NOTE — Anesthesia Procedure Notes (Signed)
Procedure Name: Intubation Date/Time: 08/13/2022 7:47 AM  Performed by: Amadeo Garnet, CRNAPre-anesthesia Checklist: Patient identified, Emergency Drugs available, Suction available and Patient being monitored Patient Re-evaluated:Patient Re-evaluated prior to induction Oxygen Delivery Method: Circle system utilized Preoxygenation: Pre-oxygenation with 100% oxygen Induction Type: IV induction Ventilation: Mask ventilation without difficulty Laryngoscope Size: Mac and 3 Grade View: Grade I Tube type: Oral Tube size: 7.0 mm Number of attempts: 1 Airway Equipment and Method: Stylet and Oral airway Placement Confirmation: ETT inserted through vocal cords under direct vision, positive ETCO2 and breath sounds checked- equal and bilateral Secured at: 21 cm Tube secured with: Tape Dental Injury: Teeth and Oropharynx as per pre-operative assessment

## 2022-08-13 NOTE — Transfer of Care (Signed)
Immediate Anesthesia Transfer of Care Note  Patient: Ryelle E Boler-Johnson  Procedure(s) Performed: right shoulder arthroscopy, debridement, biceps tenodesis, mini open rotator cuff tear repair (Right: Shoulder)  Patient Location: PACU  Anesthesia Type:GA combined with regional for post-op pain  Level of Consciousness: drowsy and patient cooperative  Airway & Oxygen Therapy: Patient Spontanous Breathing  Post-op Assessment: Report given to RN, Post -op Vital signs reviewed and stable and Patient moving all extremities  Post vital signs: Reviewed and stable  Last Vitals:  Vitals Value Taken Time  BP 151/80 08/13/22 1127  Temp 36.5 C 08/13/22 1127  Pulse 75 08/13/22 1129  Resp 18 08/13/22 1129  SpO2 94 % 08/13/22 1129  Vitals shown include unvalidated device data.  Last Pain:  Vitals:   08/13/22 0646  TempSrc:   PainSc: 0-No pain         Complications: No notable events documented.

## 2022-08-13 NOTE — Anesthesia Procedure Notes (Addendum)
  Anesthesia Regional Block: Interscalene brachial plexus block   Pre-Anesthetic Checklist: , timeout performed,  Correct Patient, Correct Site, Correct Laterality,  Correct Procedure, Correct Position, site marked,  Risks and benefits discussed,  Surgical consent,  Pre-op evaluation,  At surgeon's request  Laterality: Right  Prep: chloraprep       Needles:  Injection technique: Single-shot  Needle Type: Echogenic Stimulator Needle          Additional Needles:   Procedures: Doppler guided,,,, ultrasound used (permanent image in chart),,     Nerve Stimulator or Paresthesia:  Response: 0.5 mA  Additional Responses:   Narrative:  Start time: 08/13/2022 7:10 AM End time: 08/13/2022 7:25 AM Injection made incrementally with aspirations every 5 mL. Anesthesiologist: Belinda Block, MD

## 2022-08-13 NOTE — Op Note (Signed)
NAME: Katie Morse, Katie Morse MEDICAL RECORD NO: [id number] ACCOUNT NO: [id number] DATE OF BIRTH: 12/03/1966 FACILITY: [facility] LOCATION: [facility] PHYSICIAN: Yetta Barre. Marlou Sa, MD  Operative Report   DATE OF PROCEDURE: 08/13/2022  PREOPERATIVE DIAGNOSIS:  Right shoulder biceps tendinitis and massive rotator cuff tear involving the supraspinatus, and infraspinatus.  POSTOPERATIVE DIAGNOSIS:  Right shoulder biceps tendinitis and massive rotator cuff tear involving the supraspinatus, and infraspinatus.  PROCEDURES:  Right shoulder arthroscopy with limited debridement of the superior labrum, and mini open biceps tenodesis and mini open repair of massive rotator cuff tear involving the infraspinatus and supraspinatus.  SURGEON:  Yetta Barre. Marlou Sa, MD  ASSISTANT:  Annie Main.  INDICATIONS:  The patient presents for operative management of shoulder rotator cuff tear following a fall in January.  DESCRIPTION OF PROCEDURE:  The patient was brought to the operating room where general anesthetic was induced.  Preoperative antibiotics administered.  Timeout was called.  The patient was placed in the beach chair position with head in neutral position.   Right shoulder, arm and hand prescrubbed with alcohol and Betadine, allowed to air dry, prepped with DuraPrep solution and draped in sterile manner.  Charlie Pitter was used to seal the operative field and cover the axilla.  Posterior portal was created 2 cm  inferior and medial to the posterolateral margin of the acromion.  Anterior portal created under direct visualization.  Diagnostic arthroscopy was performed.  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   No corresponding changes on the glenoid.  The arthritis was primarily medial and superior on the humeral head.  Infraspinatus and supraspinatus tears were present.  Rotator interval release and slide was performed. The superior labrum was debrided.   Biceps  tendon was released. At this time, instruments were removed.  Portals were closed using 3-0 Vicryl.  Ioban then used to cover the entire operative field.  Next, incision made off the anterolateral margin of the acromion.  Skin and subcutaneous  tissue sharply divided.  Deltoid was split between the anterior middle raphae and measured distance of 4 cm from the anterolateral portion of the acromion.  The anterior and middle deltoids were sutured together with a stay suture at that 4 cm mark.  The  raphae was then divided and bursectomy was performed.  The patient did have a massive rotator cuff tear.  Biceps tendon was then tenodesed after opening up the bicipital groove by dividing the transverse humeral ligament.  Biceps tendon was sutured with  FiberLoop suture.  Then, under appropriate tension was tenodesed into the bicipital groove distally using a knotless SutureTak 1.9 mm from Arthrex and proximally using a 3.2 double loaded knotless suture anchor.  Oversewn x2 using 0 Vicryl suture.  A  stable appropriately tensioned biceps tenodesis was achieved.  Attention then directed towards the rotator cuff tear.  7 Mason-Allen grasping sutures of 0 Vicryl suture were placed posterior to anterior in the rotator cuff tendon.  Then, at that time  mobilization was performed by releasing all subacromial adhesions as well as adhesions between the superior glenoid and the rotator cuff itself.  Rotator interval slide was performed.  The coracohumeral ligament was also released in order to facilitate  mobilization.  All in all, we were able to get the tear mobilized to cover approximately 70% of the native footprint on the greater tuberosity.  Next, the footprint was prepared using a rongeur and knife.  At this time, three Arthrex corkscrew anchors  5.5 mm were placed at the junction of the articular surface and greater tuberosity.  These 12 suture tapes within 3 anchors were then placed using the Scorpion equidistant  beginning posterior to anterior.  At this time, the sutures tapes were tied with  the arm abducted and the tendon reduced back to its footprint.  Those 12 suture tapes were tied and crossed the anterior 6 films and one posterior.  Next, with the arm adducted the 7 Vicryl tacking sutures in the lateral end of the rotator cuff tendon  were then placed into a SwiveLocks.  The tendon was primarily mobilized anteriorly and laterally.  A good fixation was achieved with SwiveLocks.  Next, the suture tapes were placed also in the SwiveLocks anterior and posterior to the Vicryl suture  SwiveLocks.  All in all, a near watertight repair was achieved with the arm externally rotated the rotator interval was closed using 0 Vicryl suture.  Next, thorough irrigation was performed.  Vancomycin powder placed.  Deltoid split closed using #1  Vicryl suture followed by interrupted inverted 0 Vicryl suture, 2-0 Vicryl suture, and 3-0 Monocryl.  Steri-Strips and impervious dressings applied.  Donjoy type brace also applied.  The patient tolerated the procedure well without immediate  complications, transferred to the recovery room in stable condition.  Luke's assistance was required for opening, closing, mobilization of tissue.  His assistance was a medical necessity during the entire case.  Lower trapezius tendon transfer not  required in this patient due to mobilization and repair of the tendon along with early arthritic changes in the humeral head.   PUS D: 08/13/2022 11:30:30 am T: 08/13/2022 4:28:00 pm  JOB: [id number]/ [id number]

## 2022-08-13 NOTE — Addendum Note (Signed)
Addendum  created 08/13/22 1736 by Belinda Block, MD   Clinical Note Signed, Image imported, Intraprocedure Blocks edited

## 2022-08-13 NOTE — H&P (Signed)
Katie Morse is an 56 y.o. female.   Chief Complaint: Right shoulder pain HPI:  Patient presents for evaluation of right shoulder pain.  She has had pain since January but the pain became worse and she fell in April.  She is right-hand dominant.  The pain wakes her from sleep at night.  Asked to assist her arm with certain motions which are more painful particularly overhead.  Sleeps with a pillow under her arm.  She works at [employer] which is physical work.  Taking Flexeril as well as Tylenol arthritis and Aleve for her symptoms.  Has a history of diabetes with her last hemoglobin A1c 7.4 in March.  MRI scan is reviewed of the right shoulder.  This shows complete tear of the supraspinatus tendon with 3.7 cm of retraction along with infraspinatus tendon tear as well.  Subscap intact.  No muscle atrophy.  Past Medical History:  Diagnosis Date   ALLERGIC RHINITIS 08/20/2008   Allergy    Anemia    Arthritis    Blood transfusion without reported diagnosis 2011   post surgery   Diabetes mellitus without complication ([place])    DYSPNEA 11/12/2008   Dysrhythmia    a. fib   Headache(784.0) 08/20/2008   HYPERTENSION 08/20/2008   LEIOMYOMA, UTERUS 08/20/2008   PALPITATIONS, RECURRENT 11/12/2008   Sickle-cell trait ([place]) 16/09/9603   SYSTOLIC MURMUR 54/08/8118    Past Surgical History:  Procedure Laterality Date   LIPOMA EXCISION Left 07/19/2019   Procedure: EXCISION OF SUBCUTANEOUS LIPOMA LEFT BUTTOCK;  Surgeon: Donnie Mesa, MD;  Location: [facility];  Service: General;  Laterality: Left;   MYOMECTOMY     fibroids    Family History  Problem Relation Age of Onset   Hypertension Mother    Heart Problems Mother    Cancer Father        lung and prostate ca   Diabetes Other    Breast cancer Cousin        2-twins   Colon cancer Neg Hx    Social History:  reports that she has never smoked. She has never used smokeless tobacco. She reports current alcohol use.  She reports that she does not use drugs.  Allergies:  Allergies  Allergen Reactions   Latex Rash   Avocado Nausea Only   Collard Greens [Wild Lettuce Extract (Lactuca Virosa)] Other (See Comments)    Gi -upset   Lactose Intolerance (Gi)     Gi-upset   Metformin Hcl Diarrhea   Other Other (See Comments)    Harriet Masson /Gi upset   Watermelon Flavor     Gi upset    Medications Prior to Admission  Medication Sig Dispense Refill   acetaminophen (TYLENOL) 650 MG CR tablet Take 650-1,300 mg by mouth every 8 (eight) hours as needed for pain.     albuterol (VENTOLIN HFA) 108 (90 Base) MCG/ACT inhaler Inhale 1 puff into the lungs every 6 (six) hours as needed for wheezing or shortness of breath. 1 each 3   amLODipine (NORVASC) 5 MG tablet Take 1 tablet (5 mg total) by mouth daily. (Patient taking differently: Take 5 mg by mouth every morning.) 90 tablet 1   ASPIRIN LOW DOSE 81 MG EC tablet TAKE 1 TABLET BY MOUTH EVERY DAY 30 tablet 11   Aspirin-Caffeine (BAYER BACK & BODY) 500-32.5 MG TABS Take 2 tablets by mouth at bedtime as needed (Pain).     atorvastatin (LIPITOR) 80 MG tablet Take 1 tablet (80 mg total)  by mouth at bedtime. (Patient taking differently: Take 40 mg by mouth at bedtime.) 90 tablet 1   cyclobenzaprine (FLEXERIL) 5 MG tablet TAKE 1 TABLET BY MOUTH AT BEDTIME AS NEEDED FOR MUSCLE SPASMS. (Patient taking differently: Take 5 mg by mouth at bedtime.) 30 tablet 1   diclofenac (VOLTAREN) 75 MG EC tablet TAKE 1 TABLET BY MOUTH TWICE A DAY 60 tablet 1   diclofenac Sodium (VOLTAREN) 1 % GEL Apply 2 g topically daily as needed (arm pain).     ezetimibe (ZETIA) 10 MG tablet Take 1 tablet (10 mg total) by mouth daily. (Patient taking differently: Take 10 mg by mouth at bedtime.) 90 tablet 1   hydrochlorothiazide (HYDRODIURIL) 25 MG tablet TAKE 1 TABLET (25 MG TOTAL) BY MOUTH DAILY. 90 tablet 1   Ibuprofen-Acetaminophen (ADVIL DUAL ACTION) 125-250 MG TABS Take 2 tablets by mouth daily as  needed (pain).     losartan (COZAAR) 100 MG tablet Take 1 tablet (100 mg total) by mouth daily. 90 tablet 1   metFORMIN (GLUCOPHAGE) 1000 MG tablet Take 1 tablet (1,000 mg total) by mouth 2 (two) times daily with a meal. (Patient taking differently: Take 500 mg by mouth 2 (two) times daily with a meal.) 180 tablet 1   metoprolol tartrate (LOPRESSOR) 25 MG tablet Take 1 tablet (25 mg total) by mouth 2 (two) times daily. (Patient taking differently: Take 25 mg by mouth at bedtime.) 180 tablet 1   nitroGLYCERIN (NITROSTAT) 0.4 MG SL tablet Place 0.4 mg under the tongue as needed for chest pain.     Polyvinyl Alcohol-Povidone PF (REFRESH) 1.4-0.6 % SOLN Place 1 drop into both eyes in the morning, at noon, in the evening, and at bedtime.     vitamin C (ASCORBIC ACID) 500 MG tablet Take 500 mg by mouth daily.     Cyanocobalamin (VITAMIN B-12 IJ) Inject 1 mL as directed every 30 (thirty) days.     empagliflozin (JARDIANCE) 10 MG TABS tablet Take 1 tablet (10 mg total) by mouth daily before breakfast. (Patient not taking: Reported on 07/30/2022) 90 tablet 1   fluticasone (FLONASE) 50 MCG/ACT nasal spray Place 2 sprays into both nostrils daily as needed for allergies.     hydrocortisone 2.5 % cream Apply topically 2 (two) times daily. (Patient taking differently: Apply 1 Application topically daily as needed (rash).) 30 g 0   ibuprofen (ADVIL) 200 MG tablet Take 200-400 mg by mouth every 6 (six) hours as needed for mild pain or moderate pain.     Prenatal Vit-Fe Fumarate-FA (PRENATAL MULTIVITAMIN) TABS tablet Take 1 tablet by mouth daily at 12 noon.     trimethoprim-polymyxin b (POLYTRIM) ophthalmic solution Place 2 drops into both eyes every 4 (four) hours. (Patient not taking: Reported on 07/30/2022) 10 mL 0    Results for orders placed or performed during the hospital encounter of 08/13/22 (from the past 48 hour(s))  Glucose, capillary     Status: Abnormal   Collection Time: 08/13/22  6:24 AM  Result  Value Ref Range   Glucose-Capillary 172 (H) 70 - 99 mg/dL    Comment: Glucose reference range applies only to samples taken after fasting for at least 8 hours.   No results found.  Review of Systems  Musculoskeletal:  Positive for arthralgias.  All other systems reviewed and are negative.   Blood pressure (!) 169/101, pulse 88, temperature 97.9 F (36.6 C), temperature source Oral, resp. rate 18, height 5' 2.5" (1.588 m), weight 88.9 kg, last  menstrual period 07/04/2014, SpO2 99 %. Physical Exam Vitals reviewed.  HENT:     Head: Normocephalic.     Mouth/Throat:     Mouth: Mucous membranes are moist.  Eyes:     Pupils: Pupils are equal, round, and reactive to light.  Cardiovascular:     Rate and Rhythm: Normal rate.     Pulses: Normal pulses.  Pulmonary:     Effort: Pulmonary effort is normal.  Abdominal:     General: Abdomen is flat.  Musculoskeletal:     Cervical back: Normal range of motion.  Skin:    General: Skin is warm.     Capillary Refill: Capillary refill takes less than 2 seconds.  Neurological:     General: No focal deficit present.     Mental Status: She is alert.  Psychiatric:        Mood and Affect: Mood normal.    Ortho exam demonstrates passive range of motion on the left of 80/110/180.  On the right 60/105/170.  She does have weakness to infraspinatus and supraspinatus testing on the right compared to the left.  No Popeye deformity.  Deltoid is functional.  Subscap strength 5+ out of 5 bilaterally.  No masses lymphadenopathy or skin changes noted in that right shoulder girdle region.  Forward flexion and abduction strength gets her just to about 90 degrees.  Assessment/Plan  Impression is massive right shoulder rotator cuff tear.  May or may not be repairable in primary fashion.  Surgical plan at this time because of her young age and demanding work would be an attempt at rotator cuff repair.  There is some high-grade partial-thickness cartilage loss as well  which could be contributing to her pain symptoms.  That would likely not be addressed with this particular surgery.  If repair is not possible we would proceed with lower trapezius tendon transfer.  That  could restore more of the force couple back to the arm to allow her to delay need for reverse shoulder replacement.  The risk and benefits of each of these procedures are discussed with the patient including not limited to infection or vessel damage the very long rehabilitative process required for each of the surgical options.  Would also do biceps tenodesis at the time.  Patient understands the risk and benefits.  We will plan for surgery sometime in the near future.  All questions answered.  I do want to get her fitted for a abduction external rotation brace just in case we have to do the tendon transfer  Her most recent hemoglobin A1c was 8.1.  The slight increase in risk from elevated hemoglobin A1c in terms of infection is offset by the benefit of doing surgery sooner rather than later in terms of having a better chance to perform a native rotator cuff repair as opposed to tendon transfer. Anderson Malta, MD 08/13/2022, 7:33 AM

## 2022-08-13 NOTE — Brief Op Note (Addendum)
   08/13/2022  11:22 AM  PATIENT:  Katie Morse  56 y.o. female  PRE-OPERATIVE DIAGNOSIS:  right shoulder massive rotator cuff tear, biceps tendonitis  POST-OPERATIVE DIAGNOSIS:  right shoulder massive rotator cuff tear, biceps tendonitis  PROCEDURE:  Procedure(s): right shoulder arthroscopy, debridement, biceps tenodesis, mini open rotator cuff tear repair  SURGEON:  Surgeon(s): Marlou Sa, Tonna Corner, MD  ASSISTANT: Annie Main, PA  ANESTHESIA:    General  EBL: General 35 ml    Total I/O In: 1200 [I.V.:1000; IV Piggyback:200] Out: -   BLOOD ADMINISTERED: none  DRAINS: None  LOCAL MEDICATIONS USED:  none  SPECIMEN:  No Specimen  COUNTS:  YES  TOURNIQUET:  * No tourniquets in log *  DICTATION: .done but no number given  PLAN OF CARE: Discharge to home after PACU  PATIENT DISPOSITION:  PACU - hemodynamically stable   DIAGNOSES: Right shoulder, chronic rotator cuff tear, SLAP tear, and biceps tendinitis.  POST-OPERATIVE DIAGNOSIS: same  PROCEDURE: Open repair chronic rotator cuff tear - 70786 Open subpectoral biceps tenodesis - 75449 Arthroscopic limited debridement - 20100   OPERATIVE FINDING: Exam under anesthesia: Normal Articular space: Normal Chondral surfaces:  Medial and superior humeral head with grade II-III chondromalacia over 30% of the surface area Biceps:  Tendinitis present Subscapularis: Intact   Supraspinatus: Complete tear   Infraspinatus: Complete tear

## 2022-08-14 ENCOUNTER — Encounter (HOSPITAL_COMMUNITY): Payer: Self-pay | Admitting: Orthopedic Surgery

## 2022-08-16 DIAGNOSIS — M7521 Bicipital tendinitis, right shoulder: Secondary | ICD-10-CM

## 2022-08-16 DIAGNOSIS — S43431A Superior glenoid labrum lesion of right shoulder, initial encounter: Secondary | ICD-10-CM

## 2022-08-16 DIAGNOSIS — M75121 Complete rotator cuff tear or rupture of right shoulder, not specified as traumatic: Secondary | ICD-10-CM

## 2022-08-17 ENCOUNTER — Ambulatory Visit: Payer: BC Managed Care – PPO | Admitting: Internal Medicine

## 2022-08-18 ENCOUNTER — Telehealth: Payer: Self-pay | Admitting: Internal Medicine

## 2022-08-18 NOTE — Telephone Encounter (Signed)
Patient has been prescribed Diclofenac last month.  She is taking so many pills since her shoulder surgery that she is wanting a prescription for the cream called in.  [facility]

## 2022-08-20 ENCOUNTER — Encounter: Payer: Self-pay | Admitting: Orthopedic Surgery

## 2022-08-20 ENCOUNTER — Other Ambulatory Visit: Payer: Self-pay | Admitting: Internal Medicine

## 2022-08-20 ENCOUNTER — Ambulatory Visit (INDEPENDENT_AMBULATORY_CARE_PROVIDER_SITE_OTHER): Payer: BC Managed Care – PPO | Admitting: Surgical

## 2022-08-20 DIAGNOSIS — M75121 Complete rotator cuff tear or rupture of right shoulder, not specified as traumatic: Secondary | ICD-10-CM

## 2022-08-20 DIAGNOSIS — I10 Essential (primary) hypertension: Secondary | ICD-10-CM

## 2022-08-20 MED ORDER — OXYCODONE-ACETAMINOPHEN 5-325 MG PO TABS: 1.0000 | ORAL_TABLET | Freq: Four times a day (QID) | ORAL | 0 refills | Status: DC | PRN

## 2022-08-20 NOTE — Progress Notes (Signed)
Post-Op Visit Note   Patient: Katie Morse           Date of Birth: Oct 19, 1966           MRN: [id number] Visit Date: 08/20/2022 PCP: Katie Morse, Katie Halsted, MD   Assessment & Plan:  Chief Complaint:  Chief Complaint  Patient presents with   Right Shoulder - Routine Post Op     08/13/22 (7d) right shoulder arthroscopy, debridement, biceps tenodesis, mini open rotator cuff tear repair     Visit Diagnoses:  1. Complete tear of right rotator cuff, unspecified whether traumatic     Plan: Katie Morse is a 56 y.o. female who presents s/p right shoulder rotator cuff repair and biceps tenodesis on 08/09/2022.  Patient is doing well and pain is overall controlled.  Up to 52 degrees on CPM machine.  Denies any chest pain, SOB, fevers, chills. Taking pain medication every 6 hours.  Having a lot of nighttime pain that is keeping her up.  Overall pain is improving.  Block is completely worn off with no residual effects.  On exam, patient has range of motion 15 degrees external rotation, 75 degrees abduction, 90 degrees forward flexion.  Intact EPL, FPL, finger abduction, finger adduction, pronation/supination, bicep, tricep, deltoid of operative extremity.  Axillary nerve intact with deltoid firing.  Incisions are healing well without evidence of infection or dehiscence.  Sutures removed and replaced with Steri-Strips today.  2+ radial pulse of the operative extremity  Plan is continue with CPM machine.  No active lifting of the operative arm or any lifting with the operative arm.  Oxycodone refilled with plan to completely discontinue this medication around 4 to 6 weeks postop.  Did inform patient about the fairly significant chondromalacia that was noted over approximately 40% of the humeral head surface area which may be responsible for some persistent aching and soreness after recovery from the surgery.  Follow-up in 2 weeks for clinical recheck with Dr. Marlou Sa.   Continue with DonJoy sling..   Follow-Up Instructions: No follow-ups on file.   Orders:  No orders of the defined types were placed in this encounter.  Meds ordered this encounter  Medications   oxyCODONE-acetaminophen (PERCOCET) 5-325 MG tablet    Sig: Take 1 tablet by mouth every 6 (six) hours as needed for severe pain.    Dispense:  28 tablet    Refill:  0    Imaging: No results found.  PMFS History: Patient Active Problem List   Diagnosis Date Noted   Complete tear of right rotator cuff    Biceps tendinitis on right    Degenerative superior labral anterior-to-posterior (SLAP) tear of right shoulder    Vitamin B12 deficiency 08/13/2021   Chronic pain of both knees 04/30/2020   Vitamin D deficiency 09/22/2019   Chest pain 08/03/2019   Chronic left shoulder pain 02/22/2019   Neck pain 02/22/2019   Hyperlipidemia associated with type 2 diabetes mellitus ([place]) 02/17/2019   Morbid obesity ([place]) 02/17/2019   DM (diabetes mellitus), type 2 ([place]) 08/04/2016   Obesity, unspecified 09/05/2013   LEIOMYOMA, UTERUS 08/20/2008   SICKLE-CELL TRAIT 08/20/2008   Essential hypertension 08/20/2008   Past Medical History:  Diagnosis Date   ALLERGIC RHINITIS 08/20/2008   Allergy    Anemia    Arthritis    Blood transfusion without reported diagnosis 2011   post surgery   Diabetes mellitus without complication ([place])    DYSPNEA 11/12/2008   Dysrhythmia  a. fib   Headache(784.0) 08/20/2008   HYPERTENSION 08/20/2008   LEIOMYOMA, UTERUS 08/20/2008   PALPITATIONS, RECURRENT 11/12/2008   Sickle-cell trait ([place]) 63/84/6659   SYSTOLIC MURMUR 93/57/0177    Family History  Problem Relation Age of Onset   Hypertension Mother    Heart Problems Mother    Cancer Father        lung and prostate ca   Diabetes Other    Breast cancer Cousin        2-twins   Colon cancer Neg Hx     Past Surgical History:  Procedure Laterality Date   LIPOMA EXCISION Left 07/19/2019   Procedure:  EXCISION OF SUBCUTANEOUS LIPOMA LEFT BUTTOCK;  Surgeon: Donnie Mesa, MD;  Location: [facility];  Service: General;  Laterality: Left;   MYOMECTOMY     fibroids   SHOULDER ARTHROSCOPY WITH OPEN ROTATOR CUFF REPAIR AND DISTAL CLAVICLE ACROMINECTOMY Right 08/13/2022   Procedure: right shoulder arthroscopy, debridement, biceps tenodesis, mini open rotator cuff tear repair;  Surgeon: Meredith Pel, MD;  Location: [facility];  Service: Orthopedics;  Laterality: Right;   Social History   Occupational History   Not on file  Tobacco Use   Smoking status: Never   Smokeless tobacco: Never  Vaping Use   Vaping Use: Never used  Substance and Sexual Activity   Alcohol use: Yes    Alcohol/week: 0.0 standard drinks of alcohol    Comment: rarely   Drug use: No   Sexual activity: Not on file

## 2022-08-26 NOTE — Telephone Encounter (Signed)
Patient is aware 

## 2022-09-01 ENCOUNTER — Encounter: Payer: Self-pay | Admitting: Internal Medicine

## 2022-09-01 ENCOUNTER — Other Ambulatory Visit: Payer: BC Managed Care – PPO

## 2022-09-01 ENCOUNTER — Ambulatory Visit (INDEPENDENT_AMBULATORY_CARE_PROVIDER_SITE_OTHER): Payer: BC Managed Care – PPO | Admitting: Internal Medicine

## 2022-09-01 VITALS — BP 130/84 | HR 76 | Temp 98.2°F | Ht 62.0 in | Wt 190.0 lb

## 2022-09-01 DIAGNOSIS — E119 Type 2 diabetes mellitus without complications: Secondary | ICD-10-CM | POA: Diagnosis not present

## 2022-09-01 DIAGNOSIS — Z23 Encounter for immunization: Secondary | ICD-10-CM | POA: Diagnosis not present

## 2022-09-01 DIAGNOSIS — E538 Deficiency of other specified B group vitamins: Secondary | ICD-10-CM | POA: Diagnosis not present

## 2022-09-01 DIAGNOSIS — Z0000 Encounter for general adult medical examination without abnormal findings: Secondary | ICD-10-CM

## 2022-09-01 DIAGNOSIS — I10 Essential (primary) hypertension: Secondary | ICD-10-CM

## 2022-09-01 DIAGNOSIS — E785 Hyperlipidemia, unspecified: Secondary | ICD-10-CM

## 2022-09-01 DIAGNOSIS — E559 Vitamin D deficiency, unspecified: Secondary | ICD-10-CM

## 2022-09-01 DIAGNOSIS — E1169 Type 2 diabetes mellitus with other specified complication: Secondary | ICD-10-CM

## 2022-09-01 LAB — COMPREHENSIVE METABOLIC PANEL
ALT: 26 U/L (ref 0–35)
AST: 19 U/L (ref 0–37)
Albumin: 4.1 g/dL (ref 3.5–5.2)
Alkaline Phosphatase: 90 U/L (ref 39–117)
BUN: 10 mg/dL (ref 6–23)
CO2: 28 mEq/L (ref 19–32)
Calcium: 9.7 mg/dL (ref 8.4–10.5)
Chloride: 101 mEq/L (ref 96–112)
Creatinine, Ser: 0.68 mg/dL (ref 0.40–1.20)
GFR: 96.74 mL/min (ref >60.00)
Glucose, Bld: 126 mg/dL — ABNORMAL HIGH (ref 70–99)
Potassium: 3.5 mEq/L (ref 3.5–5.1)
Sodium: 139 mEq/L (ref 135–145)
Total Bilirubin: 0.5 mg/dL (ref 0.2–1.2)
Total Protein: 8.1 g/dL (ref 6.0–8.3)

## 2022-09-01 LAB — CBC WITH DIFFERENTIAL/PLATELET
Basophils Absolute: 0.1 10*3/uL (ref 0.0–0.1)
Basophils Relative: 0.9 % (ref 0.0–3.0)
Eosinophils Absolute: 0.2 10*3/uL (ref 0.0–0.7)
Eosinophils Relative: 3.2 % (ref 0.0–5.0)
HCT: 35.9 % — ABNORMAL LOW (ref 36.0–46.0)
Hemoglobin: 11.9 g/dL — ABNORMAL LOW (ref 12.0–15.0)
Lymphocytes Relative: 36.5 % (ref 12.0–46.0)
Lymphs Abs: 2.3 10*3/uL (ref 0.7–4.0)
MCHC: 33.3 g/dL (ref 30.0–36.0)
MCV: 80.9 fl (ref 78.0–100.0)
Monocytes Absolute: 0.4 10*3/uL (ref 0.1–1.0)
Monocytes Relative: 6.3 % (ref 3.0–12.0)
Neutro Abs: 3.3 10*3/uL (ref 1.4–7.7)
Neutrophils Relative %: 53.1 % (ref 43.0–77.0)
Platelets: 350 10*3/uL (ref 150.0–400.0)
RBC: 4.43 Mil/uL (ref 3.87–5.11)
RDW: 14.4 % (ref 11.5–15.5)
WBC: 6.2 10*3/uL (ref 4.0–10.5)

## 2022-09-01 LAB — MICROALBUMIN / CREATININE URINE RATIO
Creatinine,U: 81.3 mg/dL
Microalb Creat Ratio: 2 mg/g (ref 0.0–30.0)
Microalb, Ur: 1.7 mg/dL (ref 0.0–1.9)

## 2022-09-01 LAB — LIPID PANEL
Cholesterol: 158 mg/dL (ref 0–200)
HDL: 55.5 mg/dL (ref >39.00)
LDL Cholesterol: 84 mg/dL (ref 0–99)
NonHDL: 102.02
Total CHOL/HDL Ratio: 3
Triglycerides: 89 mg/dL (ref 0.0–149.0)
VLDL: 17.8 mg/dL (ref 0.0–40.0)

## 2022-09-01 LAB — VITAMIN B12: Vitamin B-12: 580 pg/mL (ref 211–911)

## 2022-09-01 LAB — VITAMIN D 25 HYDROXY (VIT D DEFICIENCY, FRACTURES): VITD: 21.16 ng/mL — ABNORMAL LOW (ref 30.00–100.00)

## 2022-09-01 LAB — TSH: TSH: 1.24 u[IU]/mL (ref 0.35–5.50)

## 2022-09-01 NOTE — Progress Notes (Signed)
Established Patient Office Visit     CC/Reason for Visit: Annual preventive exam  HPI: Katie Morse is a 56 y.o. female who is coming in today for the above mentioned reasons. Past Medical History is significant for: Hypertension, hyperlipidemia, type 2 diabetes, obesity, vitamin D and B12 deficiencies.  Since I last saw her she has had right shoulder surgery for rotator cuff tear.  She is scheduled to have her first postop visit with orthopedics tomorrow.  She has routine eye and dental care.  She is due for COVID, flu, second shingles vaccines.  She is overdue for mammogram.  She has no acute concerns or complaints.   Past Medical/Surgical History: Past Medical History:  Diagnosis Date   ALLERGIC RHINITIS 08/20/2008   Allergy    Anemia    Arthritis    Blood transfusion without reported diagnosis 2011   post surgery   Diabetes mellitus without complication ([place])    DYSPNEA 11/12/2008   Dysrhythmia    a. fib   Headache(784.0) 08/20/2008   HYPERTENSION 08/20/2008   LEIOMYOMA, UTERUS 08/20/2008   PALPITATIONS, RECURRENT 11/12/2008   Sickle-cell trait ([place]) 01/60/1093   SYSTOLIC MURMUR 23/55/7322    Past Surgical History:  Procedure Laterality Date   LIPOMA EXCISION Left 07/19/2019   Procedure: EXCISION OF SUBCUTANEOUS LIPOMA LEFT BUTTOCK;  Surgeon: Donnie Mesa, MD;  Location: [facility];  Service: General;  Laterality: Left;   MYOMECTOMY     fibroids   SHOULDER ARTHROSCOPY WITH OPEN ROTATOR CUFF REPAIR AND DISTAL CLAVICLE ACROMINECTOMY Right 08/13/2022   Procedure: right shoulder arthroscopy, debridement, biceps tenodesis, mini open rotator cuff tear repair;  Surgeon: Meredith Pel, MD;  Location: [facility];  Service: Orthopedics;  Laterality: Right;    Social History:  reports that she has never smoked. She has never used smokeless tobacco. She reports current alcohol use. She reports that she does not use drugs.  Allergies: Allergies   Allergen Reactions   Latex Rash   Avocado Nausea Only   Collard Greens [Wild Lettuce Extract (Lactuca Virosa)] Other (See Comments)    Gi -upset   Lactose Intolerance (Gi)     Gi-upset   Metformin Hcl Diarrhea   Other Other (See Comments)    Harriet Masson /Gi upset   Watermelon Flavor     Gi upset    Family History:  Family History  Problem Relation Age of Onset   Hypertension Mother    Heart Problems Mother    Cancer Father        lung and prostate ca   Diabetes Other    Breast cancer Cousin        2-twins   Colon cancer Neg Hx      Current Outpatient Medications:    acetaminophen (TYLENOL) 650 MG CR tablet, Take 650-1,300 mg by mouth every 8 (eight) hours as needed for pain., Disp: , Rfl:    albuterol (VENTOLIN HFA) 108 (90 Base) MCG/ACT inhaler, Inhale 1 puff into the lungs every 6 (six) hours as needed for wheezing or shortness of breath., Disp: 1 each, Rfl: 3   amLODipine (NORVASC) 5 MG tablet, Take 1 tablet (5 mg total) by mouth daily. (Patient taking differently: Take 5 mg by mouth every morning.), Disp: 90 tablet, Rfl: 1   ASPIRIN LOW DOSE 81 MG EC tablet, TAKE 1 TABLET BY MOUTH EVERY DAY, Disp: 30 tablet, Rfl: 11   Aspirin-Caffeine (BAYER BACK & BODY) 500-32.5 MG TABS, Take 2 tablets by mouth at  bedtime as needed (Pain)., Disp: , Rfl:    atorvastatin (LIPITOR) 40 MG tablet, TAKE 1 TABLET BY MOUTH EVERY DAY, Disp: 90 tablet, Rfl: 1   atorvastatin (LIPITOR) 80 MG tablet, Take 1 tablet (80 mg total) by mouth at bedtime. (Patient taking differently: Take 40 mg by mouth at bedtime.), Disp: 90 tablet, Rfl: 1   Cyanocobalamin (VITAMIN B-12 IJ), Inject 1 mL as directed every 30 (thirty) days., Disp: , Rfl:    cyclobenzaprine (FLEXERIL) 5 MG tablet, TAKE 1 TABLET BY MOUTH AT BEDTIME AS NEEDED FOR MUSCLE SPASMS. (Patient taking differently: Take 5 mg by mouth at bedtime.), Disp: 30 tablet, Rfl: 1   diclofenac (VOLTAREN) 75 MG EC tablet, TAKE 1 TABLET BY MOUTH TWICE A DAY, Disp: 60  tablet, Rfl: 1   diclofenac Sodium (VOLTAREN) 1 % GEL, Apply 2 g topically daily as needed (arm pain)., Disp: , Rfl:    empagliflozin (JARDIANCE) 10 MG TABS tablet, Take 1 tablet (10 mg total) by mouth daily before breakfast., Disp: 90 tablet, Rfl: 1   ezetimibe (ZETIA) 10 MG tablet, Take 1 tablet (10 mg total) by mouth daily. (Patient taking differently: Take 10 mg by mouth at bedtime.), Disp: 90 tablet, Rfl: 1   fluticasone (FLONASE) 50 MCG/ACT nasal spray, Place 2 sprays into both nostrils daily as needed for allergies., Disp: , Rfl:    hydrochlorothiazide (HYDRODIURIL) 25 MG tablet, TAKE 1 TABLET (25 MG TOTAL) BY MOUTH DAILY., Disp: 90 tablet, Rfl: 1   hydrocortisone 2.5 % cream, Apply topically 2 (two) times daily. (Patient taking differently: Apply 1 Application topically daily as needed (rash).), Disp: 30 g, Rfl: 0   losartan (COZAAR) 100 MG tablet, Take 1 tablet (100 mg total) by mouth daily., Disp: 90 tablet, Rfl: 1   metoprolol tartrate (LOPRESSOR) 25 MG tablet, Take 1 tablet (25 mg total) by mouth 2 (two) times daily. (Patient taking differently: Take 25 mg by mouth at bedtime.), Disp: 180 tablet, Rfl: 1   nitroGLYCERIN (NITROSTAT) 0.4 MG SL tablet, Place 0.4 mg under the tongue as needed for chest pain., Disp: , Rfl:    oxyCODONE-acetaminophen (PERCOCET) 5-325 MG tablet, Take 1 tablet by mouth every 6 (six) hours as needed for severe pain., Disp: 28 tablet, Rfl: 0   Polyvinyl Alcohol-Povidone PF (REFRESH) 1.4-0.6 % SOLN, Place 1 drop into both eyes in the morning, at noon, in the evening, and at bedtime., Disp: , Rfl:    Prenatal Vit-Fe Fumarate-FA (PRENATAL MULTIVITAMIN) TABS tablet, Take 1 tablet by mouth daily at 12 noon., Disp: , Rfl:    trimethoprim-polymyxin b (POLYTRIM) ophthalmic solution, Place 2 drops into both eyes every 4 (four) hours., Disp: 10 mL, Rfl: 0   vitamin C (ASCORBIC ACID) 500 MG tablet, Take 500 mg by mouth daily., Disp: , Rfl:    metFORMIN (GLUCOPHAGE) 1000 MG  tablet, Take 1 tablet (1,000 mg total) by mouth 2 (two) times daily with a meal. (Patient not taking: Reported on 09/01/2022), Disp: 180 tablet, Rfl: 1  Review of Systems:  Constitutional: Denies fever, chills, diaphoresis, appetite change and fatigue.  HEENT: Denies photophobia, eye pain, redness, hearing loss, ear pain, congestion, sore throat, rhinorrhea, sneezing, mouth sores, trouble swallowing, neck pain, neck stiffness and tinnitus.   Respiratory: Denies SOB, DOE, cough, chest tightness,  and wheezing.   Cardiovascular: Denies chest pain, palpitations and leg swelling.  Gastrointestinal: Denies nausea, vomiting, abdominal pain, diarrhea, constipation, blood in stool and abdominal distention.  Genitourinary: Denies dysuria, urgency, frequency, hematuria, flank pain  and difficulty urinating.  Endocrine: Denies: hot or cold intolerance, sweats, changes in hair or nails, polyuria, polydipsia. Musculoskeletal: Denies myalgias, back pain, joint swelling, arthralgias and gait problem.  Skin: Denies pallor, rash and wound.  Neurological: Denies dizziness, seizures, syncope, weakness, light-headedness, numbness and headaches.  Hematological: Denies adenopathy. Easy bruising, personal or family bleeding history  Psychiatric/Behavioral: Denies suicidal ideation, mood changes, confusion, nervousness, sleep disturbance and agitation    Physical Exam: Vitals:   09/01/22 1434  BP: 130/84  Pulse: 76  Temp: 98.2 F (36.8 C)  TempSrc: Oral  SpO2: 99%  Weight: 190 lb (86.2 kg)  Height: '5\' 2"'$  (1.575 m)    Body mass index is 34.75 kg/m.   Constitutional: NAD, calm, comfortable Eyes: PERRL, lids and conjunctivae normal ENMT: Mucous membranes are moist. Posterior pharynx clear of any exudate or lesions. Normal dentition. Tympanic membrane is pearly white, no erythema or bulging. Neck: normal, supple, no masses, no thyromegaly Respiratory: clear to auscultation bilaterally, no wheezing, no  crackles. Normal respiratory effort. No accessory muscle use.  Cardiovascular: Regular rate and rhythm, no murmurs / rubs / gallops. No extremity edema. 2+ pedal pulses. No carotid bruits.  Abdomen: no tenderness, no masses palpated. No hepatosplenomegaly. Bowel sounds positive.  Musculoskeletal: no clubbing / cyanosis. No joint deformity upper and lower extremities. Good ROM, no contractures. Normal muscle tone.  Right arm in sling Skin: no rashes, lesions, ulcers. No induration Neurologic: CN 2-12 grossly intact. Sensation intact, DTR normal. Strength 5/5 in all 4.  Psychiatric: Normal judgment and insight. Alert and oriented x 3. Normal mood.    Impression and Plan:  Encounter for preventive health examination  Type 2 diabetes mellitus without complication, without long-term current use of insulin ([place]) - Plan: MM Digital Screening, CBC with Differential/Platelet, Comprehensive metabolic panel, Hemoglobin A1c, TSH, Microalbumin / creatinine urine ratio, Microalbumin / creatinine urine ratio, TSH, Hemoglobin A1c, Comprehensive metabolic panel, CBC with Differential/Platelet, CANCELED: Urine microalbumin-creatinine with uACR  Vitamin B12 deficiency - Plan: Vitamin B12, Vitamin B12  Vitamin D deficiency - Plan: VITAMIN D 25 Hydroxy (Vit-D Deficiency, Fractures), VITAMIN D 25 Hydroxy (Vit-D Deficiency, Fractures)  Essential hypertension  Hyperlipidemia associated with type 2 diabetes mellitus ([place]) - Plan: Lipid panel, Lipid panel  Need for influenza vaccination   -Recommend routine eye and dental care. -Immunizations: Flu vaccine in office today, she would like to inquire about cost of second shingles vaccine before proceeding, she will get bivalent COVID at pharmacy -Healthy lifestyle discussed in detail. -Labs to be updated today. -Colon cancer screening: 02/2016, 10-year follow-up -Breast cancer screening: 06/2021, order placed -Cervical cancer screening: 06/2021, followed by  GYN -Lung cancer screening: Not applicable -Prostate cancer screening: Not applicable -DEXA: Not applicable    Deondrea Markos Isaac Bliss, MD [facility]

## 2022-09-02 ENCOUNTER — Encounter: Payer: Self-pay | Admitting: Orthopedic Surgery

## 2022-09-02 ENCOUNTER — Ambulatory Visit (INDEPENDENT_AMBULATORY_CARE_PROVIDER_SITE_OTHER): Payer: BC Managed Care – PPO | Admitting: Orthopedic Surgery

## 2022-09-02 ENCOUNTER — Other Ambulatory Visit: Payer: Self-pay | Admitting: Internal Medicine

## 2022-09-02 DIAGNOSIS — M75121 Complete rotator cuff tear or rupture of right shoulder, not specified as traumatic: Secondary | ICD-10-CM

## 2022-09-02 LAB — HEMOGLOBIN A1C
Hgb A1c MFr Bld: 8.8 % of total Hgb — ABNORMAL HIGH (ref <5.7)
Mean Plasma Glucose: 206 mg/dL
eAG (mmol/L): 11.4 mmol/L

## 2022-09-02 MED ORDER — HYDROCODONE-ACETAMINOPHEN 5-325 MG PO TABS: 1.0000 | ORAL_TABLET | Freq: Four times a day (QID) | ORAL | 0 refills | Status: AC | PRN

## 2022-09-02 NOTE — Progress Notes (Signed)
Post-Op Visit Note   Patient: Katie Morse           Date of Birth: March 05, 1966           MRN: [id number] Visit Date: 09/02/2022 PCP: Isaac Bliss, Rayford Halsted, MD   Assessment & Plan:  Chief Complaint:  Chief Complaint  Patient presents with   Right Shoulder - Routine Post Op     08/13/22 (2w 6d) right shoulder arthroscopy, debridement, biceps tenodesis, mini open rotator cuff tear repair    Visit Diagnoses:  1. Complete tear of right rotator cuff, unspecified whether traumatic     Plan: Patient presents now 3 weeks out right shoulder arthroscopy with debridement biceps tenodesis and masses noted may have rotator cuff tear repair.  She is doing well.  She is in a abduction splint.  On examination she has well-healed surgical incision.  Not too much crepitus or grinding with passive range of motion. Plan at this time is to continue the sling  For mobilization but start physical therapy for today limbs only 1 times a week for the next 3 weeks.  Come back in 3 weeks we will discontinue the sling then.  We will also advance her physical therapy then for overhead motion as well as mild strengthening.  No jury duty for the next 2 months status post shoulder surgery.  Muscle relaxers norco refilled.  Therapy prescription provided.  Follow-Up Instructions: No follow-ups on file.   Orders:  No orders of the defined types were placed in this encounter.  No orders of the defined types were placed in this encounter.   Imaging: No results found.  PMFS History: Patient Active Problem List   Diagnosis Date Noted   Complete tear of right rotator cuff    Biceps tendinitis on right    Degenerative superior labral anterior-to-posterior (SLAP) tear of right shoulder    Vitamin B12 deficiency 08/13/2021   Chronic pain of both knees 04/30/2020   Vitamin D deficiency 09/22/2019   Chest pain 08/03/2019   Chronic left shoulder pain 02/22/2019   Neck pain 02/22/2019    Hyperlipidemia associated with type 2 diabetes mellitus ([place]) 02/17/2019   Morbid obesity ([place]) 02/17/2019   DM (diabetes mellitus), type 2 ([place]) 08/04/2016   Obesity, unspecified 09/05/2013   LEIOMYOMA, UTERUS 08/20/2008   SICKLE-CELL TRAIT 08/20/2008   Essential hypertension 08/20/2008   Past Medical History:  Diagnosis Date   ALLERGIC RHINITIS 08/20/2008   Allergy    Anemia    Arthritis    Blood transfusion without reported diagnosis 2011   post surgery   Diabetes mellitus without complication ([place])    DYSPNEA 11/12/2008   Dysrhythmia    a. fib   Headache(784.0) 08/20/2008   HYPERTENSION 08/20/2008   LEIOMYOMA, UTERUS 08/20/2008   PALPITATIONS, RECURRENT 11/12/2008   Sickle-cell trait ([place]) 88/89/1694   SYSTOLIC MURMUR 50/38/8828    Family History  Problem Relation Age of Onset   Hypertension Mother    Heart Problems Mother    Cancer Father        lung and prostate ca   Diabetes Other    Breast cancer Cousin        2-twins   Colon cancer Neg Hx     Past Surgical History:  Procedure Laterality Date   LIPOMA EXCISION Left 07/19/2019   Procedure: EXCISION OF SUBCUTANEOUS LIPOMA LEFT BUTTOCK;  Surgeon: Donnie Mesa, MD;  Location: [facility];  Service: General;  Laterality: Left;   MYOMECTOMY  fibroids   SHOULDER ARTHROSCOPY WITH OPEN ROTATOR CUFF REPAIR AND DISTAL CLAVICLE ACROMINECTOMY Right 08/13/2022   Procedure: right shoulder arthroscopy, debridement, biceps tenodesis, mini open rotator cuff tear repair;  Surgeon: Meredith Pel, MD;  Location: [facility];  Service: Orthopedics;  Laterality: Right;   Social History   Occupational History   Not on file  Tobacco Use   Smoking status: Never   Smokeless tobacco: Never  Vaping Use   Vaping Use: Never used  Substance and Sexual Activity   Alcohol use: Yes    Alcohol/week: 0.0 standard drinks of alcohol    Comment: rarely   Drug use: No   Sexual activity: Not on file

## 2022-09-03 ENCOUNTER — Other Ambulatory Visit: Payer: Self-pay | Admitting: Internal Medicine

## 2022-09-03 DIAGNOSIS — E559 Vitamin D deficiency, unspecified: Secondary | ICD-10-CM

## 2022-09-03 MED ORDER — VITAMIN D (ERGOCALCIFEROL) 1.25 MG (50000 UNIT) PO CAPS: 50000.0000 [IU] | ORAL_CAPSULE | ORAL | 0 refills | Status: AC

## 2022-09-08 ENCOUNTER — Other Ambulatory Visit: Payer: Self-pay | Admitting: Internal Medicine

## 2022-09-08 DIAGNOSIS — G8929 Other chronic pain: Secondary | ICD-10-CM

## 2022-09-10 ENCOUNTER — Other Ambulatory Visit: Payer: Self-pay | Admitting: Internal Medicine

## 2022-09-10 DIAGNOSIS — Z1231 Encounter for screening mammogram for malignant neoplasm of breast: Secondary | ICD-10-CM

## 2022-09-10 DIAGNOSIS — E119 Type 2 diabetes mellitus without complications: Secondary | ICD-10-CM

## 2022-09-22 DIAGNOSIS — Z7982 Long term (current) use of aspirin: Secondary | ICD-10-CM | POA: Diagnosis not present

## 2022-09-22 DIAGNOSIS — Z79891 Long term (current) use of opiate analgesic: Secondary | ICD-10-CM | POA: Diagnosis not present

## 2022-09-22 DIAGNOSIS — E669 Obesity, unspecified: Secondary | ICD-10-CM | POA: Diagnosis not present

## 2022-09-22 DIAGNOSIS — Z7951 Long term (current) use of inhaled steroids: Secondary | ICD-10-CM | POA: Diagnosis not present

## 2022-09-22 DIAGNOSIS — Z79899 Other long term (current) drug therapy: Secondary | ICD-10-CM | POA: Diagnosis not present

## 2022-09-22 DIAGNOSIS — E119 Type 2 diabetes mellitus without complications: Secondary | ICD-10-CM | POA: Diagnosis not present

## 2022-09-22 DIAGNOSIS — Z6835 Body mass index (BMI) 35.0-35.9, adult: Secondary | ICD-10-CM | POA: Diagnosis not present

## 2022-09-22 DIAGNOSIS — H2512 Age-related nuclear cataract, left eye: Secondary | ICD-10-CM | POA: Diagnosis not present

## 2022-09-22 DIAGNOSIS — I10 Essential (primary) hypertension: Secondary | ICD-10-CM | POA: Diagnosis not present

## 2022-09-22 DIAGNOSIS — Z888 Allergy status to other drugs, medicaments and biological substances status: Secondary | ICD-10-CM | POA: Diagnosis not present

## 2022-09-22 DIAGNOSIS — Z791 Long term (current) use of non-steroidal anti-inflammatories (NSAID): Secondary | ICD-10-CM | POA: Diagnosis not present

## 2022-09-22 DIAGNOSIS — Z91040 Latex allergy status: Secondary | ICD-10-CM | POA: Diagnosis not present

## 2022-09-22 DIAGNOSIS — Z7984 Long term (current) use of oral hypoglycemic drugs: Secondary | ICD-10-CM | POA: Diagnosis not present

## 2022-09-22 DIAGNOSIS — E739 Lactose intolerance, unspecified: Secondary | ICD-10-CM | POA: Diagnosis not present

## 2022-09-22 DIAGNOSIS — Z91018 Allergy to other foods: Secondary | ICD-10-CM | POA: Diagnosis not present

## 2022-09-23 DIAGNOSIS — H2511 Age-related nuclear cataract, right eye: Secondary | ICD-10-CM | POA: Diagnosis not present

## 2022-09-25 ENCOUNTER — Telehealth: Payer: Self-pay | Admitting: Internal Medicine

## 2022-09-25 NOTE — Telephone Encounter (Signed)
Pt called to say the metFORMIN (GLUCOPHAGE) 1000 MG tablet  is upsetting her stomach very badly and she would like to know if MD can find her a substitute?  [id number] [place]:  [phone] Fax:  [phone]    Pt was informed that MD does not work on Fridays.  LOV:  09/01/22

## 2022-09-28 NOTE — Telephone Encounter (Signed)
Pt was scheduled for Wednesday - 09/30/22 @ 8am.

## 2022-09-30 ENCOUNTER — Ambulatory Visit: Payer: BC Managed Care – PPO | Admitting: Internal Medicine

## 2022-09-30 ENCOUNTER — Ambulatory Visit (INDEPENDENT_AMBULATORY_CARE_PROVIDER_SITE_OTHER): Payer: BC Managed Care – PPO | Admitting: Internal Medicine

## 2022-09-30 ENCOUNTER — Encounter: Payer: BC Managed Care – PPO | Admitting: Orthopedic Surgery

## 2022-09-30 VITALS — BP 120/74 | HR 91 | Temp 98.3°F | Resp 18 | Ht 62.0 in | Wt 197.0 lb

## 2022-09-30 DIAGNOSIS — E1169 Type 2 diabetes mellitus with other specified complication: Secondary | ICD-10-CM

## 2022-09-30 MED ORDER — OZEMPIC (0.25 OR 0.5 MG/DOSE) 2 MG/3ML ~~LOC~~ SOPN: 0.5000 mg | PEN_INJECTOR | SUBCUTANEOUS | 2 refills | Status: DC

## 2022-09-30 NOTE — Progress Notes (Signed)
Established Patient Office Visit     CC/Reason for Visit: Diarrhea with metformin  HPI: Katie Morse is a 56 y.o. female who is coming in today for the above mentioned reasons. She is unable to tolerate metformin due to stomach upset and diarrhea. She is wanting to discuss alternative treatment.   Past Medical/Surgical History: Past Medical History:  Diagnosis Date   ALLERGIC RHINITIS 08/20/2008   Allergy    Anemia    Arthritis    Blood transfusion without reported diagnosis 2011   post surgery   Diabetes mellitus without complication ([place])    DYSPNEA 11/12/2008   Dysrhythmia    a. fib   Headache(784.0) 08/20/2008   HYPERTENSION 08/20/2008   LEIOMYOMA, UTERUS 08/20/2008   PALPITATIONS, RECURRENT 11/12/2008   Sickle-cell trait ([place]) 91/79/1505   SYSTOLIC MURMUR 69/79/4801    Past Surgical History:  Procedure Laterality Date   LIPOMA EXCISION Left 07/19/2019   Procedure: EXCISION OF SUBCUTANEOUS LIPOMA LEFT BUTTOCK;  Surgeon: Donnie Mesa, MD;  Location: [facility];  Service: General;  Laterality: Left;   MYOMECTOMY     fibroids   SHOULDER ARTHROSCOPY WITH OPEN ROTATOR CUFF REPAIR AND DISTAL CLAVICLE ACROMINECTOMY Right 08/13/2022   Procedure: right shoulder arthroscopy, debridement, biceps tenodesis, mini open rotator cuff tear repair;  Surgeon: Meredith Pel, MD;  Location: [facility];  Service: Orthopedics;  Laterality: Right;    Social History:  reports that she has never smoked. She has never used smokeless tobacco. She reports current alcohol use. She reports that she does not use drugs.  Allergies: Allergies  Allergen Reactions   Latex Rash   Wild Lettuce Extract (Lactuca Virosa) Other (See Comments)    Gi -upset Other reaction(s): Other Gi -upset   Avocado Nausea Only   Lactose Intolerance (Gi)     Gi-upset   Metformin Hcl Diarrhea   Other Other (See Comments)    Harriet Masson /Gi upset   Watermelon Flavor     Gi upset     Family History:  Family History  Problem Relation Age of Onset   Hypertension Mother    Heart Problems Mother    Cancer Father        lung and prostate ca   Diabetes Other    Breast cancer Cousin        2-twins   Colon cancer Neg Hx      Current Outpatient Medications:    acetaminophen (TYLENOL) 650 MG CR tablet, Take 650-1,300 mg by mouth every 8 (eight) hours as needed for pain., Disp: , Rfl:    albuterol (VENTOLIN HFA) 108 (90 Base) MCG/ACT inhaler, INHALE 1 PUFF INTO THE LUNGS EVERY 6 HOURS AS NEEDED FOR WHEEZING OR SHORTNESS OF BREATH., Disp: 6.7 each, Rfl: 3   amLODipine (NORVASC) 5 MG tablet, Take 1 tablet (5 mg total) by mouth daily. (Patient taking differently: Take 5 mg by mouth every morning.), Disp: 90 tablet, Rfl: 1   ASPIRIN LOW DOSE 81 MG EC tablet, TAKE 1 TABLET BY MOUTH EVERY DAY, Disp: 30 tablet, Rfl: 11   Aspirin-Caffeine (BAYER BACK & BODY) 500-32.5 MG TABS, Take 2 tablets by mouth at bedtime as needed (Pain)., Disp: , Rfl:    atorvastatin (LIPITOR) 80 MG tablet, Take 1 tablet (80 mg total) by mouth at bedtime. (Patient taking differently: Take 40 mg by mouth at bedtime.), Disp: 90 tablet, Rfl: 1   Cranberry 250 MG CAPS, Orally, Disp: , Rfl:    Cyanocobalamin (VITAMIN B-12 IJ),  Inject 1 mL as directed every 30 (thirty) days., Disp: , Rfl:    cyclobenzaprine (FLEXERIL) 5 MG tablet, TAKE 1 TABLET BY MOUTH AT BEDTIME AS NEEDED FOR MUSCLE SPASMS., Disp: 30 tablet, Rfl: 1   diclofenac (VOLTAREN) 75 MG EC tablet, TAKE 1 TABLET BY MOUTH TWICE A DAY, Disp: 60 tablet, Rfl: 1   diclofenac Sodium (VOLTAREN) 1 % GEL, Apply 2 g topically daily as needed (arm pain)., Disp: , Rfl:    ezetimibe (ZETIA) 10 MG tablet, Take 1 tablet (10 mg total) by mouth daily. (Patient taking differently: Take 10 mg by mouth at bedtime.), Disp: 90 tablet, Rfl: 1   fluticasone (FLONASE) 50 MCG/ACT nasal spray, Place 2 sprays into both nostrils daily as needed for allergies., Disp: , Rfl:     fluticasone (FLONASE) 50 MCG/ACT nasal spray, Place into the nose., Disp: , Rfl:    hydrochlorothiazide (HYDRODIURIL) 25 MG tablet, TAKE 1 TABLET (25 MG TOTAL) BY MOUTH DAILY., Disp: 90 tablet, Rfl: 1   HYDROcodone-acetaminophen (NORCO) 5-325 MG tablet, Take 1 tablet by mouth every 6 (six) hours as needed for moderate pain., Disp: 35 tablet, Rfl: 0   hydrocortisone 2.5 % cream, Apply topically 2 (two) times daily. (Patient taking differently: Apply 1 Application topically daily as needed (rash).), Disp: 30 g, Rfl: 0   ketorolac (ACULAR) 0.5 % ophthalmic solution, SMARTSIG:In Eye(s), Disp: , Rfl:    losartan (COZAAR) 100 MG tablet, Take 1 tablet (100 mg total) by mouth daily., Disp: 90 tablet, Rfl: 1   metoprolol tartrate (LOPRESSOR) 25 MG tablet, Take 1 tablet (25 mg total) by mouth 2 (two) times daily. (Patient taking differently: Take 25 mg by mouth at bedtime.), Disp: 180 tablet, Rfl: 1   moxifloxacin (VIGAMOX) 0.5 % ophthalmic solution, Apply to eye., Disp: , Rfl:    Multiple Vitamins-Minerals (HAIR/SKIN/NAILS/BIOTIN PO), Orally, Disp: , Rfl:    nitroGLYCERIN (NITROSTAT) 0.4 MG SL tablet, Place 0.4 mg under the tongue as needed for chest pain., Disp: , Rfl:    Omega-3 Fatty Acids (FISH OIL) 500 MG CAPS, 1 capsule Orally Twice a day, Disp: , Rfl:    Polyvinyl Alcohol-Povidone PF (REFRESH) 1.4-0.6 % SOLN, Place 1 drop into both eyes in the morning, at noon, in the evening, and at bedtime., Disp: , Rfl:    POTASSIUM PO, Potassium, Disp: , Rfl:    prednisoLONE acetate (PRED FORTE) 1 % ophthalmic suspension, one drop 4 (four) times daily., Disp: , Rfl:    Prenatal Vit-Fe Fumarate-FA (PRENATAL MULTIVITAMIN) TABS tablet, Take 1 tablet by mouth daily at 12 noon., Disp: , Rfl:    trimethoprim-polymyxin b (POLYTRIM) ophthalmic solution, Place 2 drops into both eyes every 4 (four) hours., Disp: 10 mL, Rfl: 0   vitamin C (ASCORBIC ACID) 500 MG tablet, Take 500 mg by mouth daily., Disp: , Rfl:    Vitamin D,  Ergocalciferol, (DRISDOL) 1.25 MG (50000 UNIT) CAPS capsule, Take 1 capsule (50,000 Units total) by mouth every 7 (seven) days for 12 doses., Disp: 12 capsule, Rfl: 0   vitamin E 200 UNIT capsule, one capsule (200 Units dose)., Disp: , Rfl:    metFORMIN (GLUCOPHAGE) 1000 MG tablet, Take 1 tablet (1,000 mg total) by mouth 2 (two) times daily with a meal. (Patient not taking: Reported on 09/01/2022), Disp: 180 tablet, Rfl: 1   Semaglutide,0.25 or 0.'5MG'$ /DOS, (OZEMPIC, 0.25 OR 0.5 MG/DOSE,) 2 MG/3ML SOPN, Inject 0.5 mg into the skin once a week. 0.25 mg weekly for the first 4 weeks and increase  to 0.5 mg weekly on week 5., Disp: 3 mL, Rfl: 2  Review of Systems:  Constitutional: Denies fever, chills, diaphoresis, appetite change and fatigue.  HEENT: Denies photophobia, eye pain, redness, hearing loss, ear pain, congestion, sore throat, rhinorrhea, sneezing, mouth sores, trouble swallowing, neck pain, neck stiffness and tinnitus.   Respiratory: Denies SOB, DOE, cough, chest tightness,  and wheezing.   Cardiovascular: Denies chest pain, palpitations and leg swelling.  Gastrointestinal: Denies vomiting, abdominal pain,constipation, blood in stool and abdominal distention.  Genitourinary: Denies dysuria, urgency, frequency, hematuria, flank pain and difficulty urinating.  Endocrine: Denies: hot or cold intolerance, sweats, changes in hair or nails, polyuria, polydipsia. Musculoskeletal: Denies myalgias, back pain, joint swelling, arthralgias and gait problem.  Skin: Denies pallor, rash and wound.  Neurological: Denies dizziness, seizures, syncope, weakness, light-headedness, numbness and headaches.  Hematological: Denies adenopathy. Easy bruising, personal or family bleeding history  Psychiatric/Behavioral: Denies suicidal ideation, mood changes, confusion, nervousness, sleep disturbance and agitation    Physical Exam: Vitals:   09/30/22 1548  BP: 120/74  Pulse: 91  Resp: 18  Temp: 98.3 F (36.8 C)   TempSrc: Oral  SpO2: 98%  Weight: 197 lb (89.4 kg)  Height: '5\' 2"'$  (1.575 m)    Body mass index is 36.03 kg/m.   Constitutional: NAD, calm, comfortable Eyes: PERRL, lids and conjunctivae normal ENMT: Mucous membranes are moist.  Psychiatric: Normal judgment and insight. Alert and oriented x 3. Normal mood.    Impression and Plan:  Type 2 diabetes mellitus with other specified complication, without long-term current use of insulin ([place]) - Plan: Semaglutide,0.25 or 0.'5MG'$ /DOS, (OZEMPIC, 0.25 OR 0.5 MG/DOSE,) 2 MG/3ML SOPN, DISCONTINUED: Semaglutide,0.25 or 0.'5MG'$ /DOS, (OZEMPIC, 0.25 OR 0.5 MG/DOSE,) 2 MG/3ML SOPN  -Since she cannot tolerate metformin, will discontinue and start Ozempic 0.25 mg weekly and increase to 0.5 mg on week 5. We have discussed potential side effects. Follow up in 3 months  Time spent:31 minutes reviewing chart, interviewing and examining patient and formulating plan of care.      Lelon Frohlich, MD [facility] at [facility]

## 2022-10-02 ENCOUNTER — Ambulatory Visit
Admission: RE | Admit: 2022-10-02 | Discharge: 2022-10-02 | Disposition: A | Discharge status: Home / Self Care | Payer: BC Managed Care – PPO | Source: Ambulatory Visit | Attending: Internal Medicine | Admitting: Internal Medicine

## 2022-10-02 DIAGNOSIS — Z1231 Encounter for screening mammogram for malignant neoplasm of breast: Secondary | ICD-10-CM | POA: Diagnosis not present

## 2022-10-12 ENCOUNTER — Ambulatory Visit (INDEPENDENT_AMBULATORY_CARE_PROVIDER_SITE_OTHER): Payer: BC Managed Care – PPO | Admitting: Orthopedic Surgery

## 2022-10-12 ENCOUNTER — Encounter: Payer: Self-pay | Admitting: Orthopedic Surgery

## 2022-10-12 VITALS — Ht 62.0 in | Wt 197.0 lb

## 2022-10-12 DIAGNOSIS — M75121 Complete rotator cuff tear or rupture of right shoulder, not specified as traumatic: Secondary | ICD-10-CM

## 2022-10-12 NOTE — Progress Notes (Signed)
Post-Op Visit Note   Patient: Katie Morse           Date of Birth: Nov 09, 1966           MRN: [id number] Visit Date: 10/12/2022 PCP: Isaac Bliss, Rayford Halsted, MD   Assessment & Plan:  Chief Complaint:  Chief Complaint  Patient presents with   Right Shoulder - Routine Post Op, Follow-up    08/13/2022 right shoulder arthroscopy, debridement, biceps tenodesis, mini open rotator cuff tear repair   Visit Diagnoses:  1. Complete tear of right rotator cuff, unspecified whether traumatic     Plan: Patient presents now 6 weeks out right shoulder arthroscopy debridement biceps tenodesis mini open rotator cuff tear repair.  She is doing okay.  And she finished her CPM machine about a week ago.  No physical therapy yet.  She has been in an abduction brace.  On examination she has slight grinding with internal/external rotation about 45 degrees of abduction.  Rotator cuff strength is actually excellent infraspinatus and testing.  Range of motion is 30/95/150.  She is going to have to be out of work for at least 2 more months until she gets some strength back in that shoulder.  Plan is to start physical therapy for active and passive range of motion and active assisted range of motion for the next 3 weeks 2 times a week.  In 3 weeks from now he can start doing some strengthening exercises and she will need to come back and see [place] in 6 weeks  Follow-Up Instructions: No follow-ups on file.   Orders:  No orders of the defined types were placed in this encounter.  No orders of the defined types were placed in this encounter.   Imaging: No results found.  PMFS History: Patient Active Problem List   Diagnosis Date Noted   Complete tear of right rotator cuff    Biceps tendinitis on right    Degenerative superior labral anterior-to-posterior (SLAP) tear of right shoulder    Vitamin B12 deficiency 08/13/2021   Chronic pain of both knees 04/30/2020   Vitamin D deficiency 09/22/2019    Chest pain 08/03/2019   Chronic left shoulder pain 02/22/2019   Neck pain 02/22/2019   Hyperlipidemia associated with type 2 diabetes mellitus ([place]) 02/17/2019   Morbid obesity ([place]) 02/17/2019   DM (diabetes mellitus), type 2 ([place]) 08/04/2016   Obesity, unspecified 09/05/2013   LEIOMYOMA, UTERUS 08/20/2008   SICKLE-CELL TRAIT 08/20/2008   Essential hypertension 08/20/2008   Past Medical History:  Diagnosis Date   ALLERGIC RHINITIS 08/20/2008   Allergy    Anemia    Arthritis    Blood transfusion without reported diagnosis 2011   post surgery   Diabetes mellitus without complication ([place])    DYSPNEA 11/12/2008   Dysrhythmia    a. fib   Headache(784.0) 08/20/2008   HYPERTENSION 08/20/2008   LEIOMYOMA, UTERUS 08/20/2008   PALPITATIONS, RECURRENT 11/12/2008   Sickle-cell trait ([place]) 40/98/1191   SYSTOLIC MURMUR 47/82/9562    Family History  Problem Relation Age of Onset   Hypertension Mother    Heart Problems Mother    Cancer Father        lung and prostate ca   Diabetes Other    Breast cancer Cousin        2-twins   Colon cancer Neg Hx     Past Surgical History:  Procedure Laterality Date   LIPOMA EXCISION Left 07/19/2019   Procedure: EXCISION OF SUBCUTANEOUS LIPOMA LEFT  BUTTOCK;  Surgeon: Donnie Mesa, MD;  Location: [facility];  Service: General;  Laterality: Left;   MYOMECTOMY     fibroids   SHOULDER ARTHROSCOPY WITH OPEN ROTATOR CUFF REPAIR AND DISTAL CLAVICLE ACROMINECTOMY Right 08/13/2022   Procedure: right shoulder arthroscopy, debridement, biceps tenodesis, mini open rotator cuff tear repair;  Surgeon: Meredith Pel, MD;  Location: [facility];  Service: Orthopedics;  Laterality: Right;   Social History   Occupational History   Not on file  Tobacco Use   Smoking status: Never   Smokeless tobacco: Never  Vaping Use   Vaping Use: Never used  Substance and Sexual Activity   Alcohol use: Yes    Alcohol/week: 0.0 standard drinks of  alcohol    Comment: rarely   Drug use: No   Sexual activity: Not on file

## 2022-10-13 ENCOUNTER — Other Ambulatory Visit: Payer: Self-pay | Admitting: Internal Medicine

## 2022-10-13 DIAGNOSIS — Z961 Presence of intraocular lens: Secondary | ICD-10-CM | POA: Diagnosis not present

## 2022-10-13 DIAGNOSIS — Z8616 Personal history of COVID-19: Secondary | ICD-10-CM | POA: Diagnosis not present

## 2022-10-13 DIAGNOSIS — Z791 Long term (current) use of non-steroidal anti-inflammatories (NSAID): Secondary | ICD-10-CM | POA: Diagnosis not present

## 2022-10-13 DIAGNOSIS — E1136 Type 2 diabetes mellitus with diabetic cataract: Secondary | ICD-10-CM | POA: Diagnosis not present

## 2022-10-13 DIAGNOSIS — H2511 Age-related nuclear cataract, right eye: Secondary | ICD-10-CM | POA: Diagnosis not present

## 2022-10-13 DIAGNOSIS — Z79899 Other long term (current) drug therapy: Secondary | ICD-10-CM | POA: Diagnosis not present

## 2022-10-13 DIAGNOSIS — E739 Lactose intolerance, unspecified: Secondary | ICD-10-CM | POA: Diagnosis not present

## 2022-10-13 DIAGNOSIS — Z888 Allergy status to other drugs, medicaments and biological substances status: Secondary | ICD-10-CM | POA: Diagnosis not present

## 2022-10-13 DIAGNOSIS — J449 Chronic obstructive pulmonary disease, unspecified: Secondary | ICD-10-CM | POA: Diagnosis not present

## 2022-10-13 DIAGNOSIS — E785 Hyperlipidemia, unspecified: Secondary | ICD-10-CM | POA: Diagnosis not present

## 2022-10-13 DIAGNOSIS — Z91018 Allergy to other foods: Secondary | ICD-10-CM | POA: Diagnosis not present

## 2022-10-13 DIAGNOSIS — Z9889 Other specified postprocedural states: Secondary | ICD-10-CM | POA: Diagnosis not present

## 2022-10-13 DIAGNOSIS — H25011 Cortical age-related cataract, right eye: Secondary | ICD-10-CM | POA: Diagnosis not present

## 2022-10-13 DIAGNOSIS — I10 Essential (primary) hypertension: Secondary | ICD-10-CM | POA: Diagnosis not present

## 2022-10-13 DIAGNOSIS — Z91040 Latex allergy status: Secondary | ICD-10-CM | POA: Diagnosis not present

## 2022-10-13 DIAGNOSIS — Z7982 Long term (current) use of aspirin: Secondary | ICD-10-CM | POA: Diagnosis not present

## 2022-10-13 DIAGNOSIS — Z9842 Cataract extraction status, left eye: Secondary | ICD-10-CM | POA: Diagnosis not present

## 2022-10-14 DIAGNOSIS — M6281 Muscle weakness (generalized): Secondary | ICD-10-CM | POA: Diagnosis not present

## 2022-10-14 DIAGNOSIS — M25511 Pain in right shoulder: Secondary | ICD-10-CM | POA: Diagnosis not present

## 2022-10-14 DIAGNOSIS — M25611 Stiffness of right shoulder, not elsewhere classified: Secondary | ICD-10-CM | POA: Diagnosis not present

## 2022-10-15 ENCOUNTER — Telehealth: Payer: Self-pay | Admitting: Internal Medicine

## 2022-10-15 NOTE — Telephone Encounter (Signed)
No samples are aviable

## 2022-10-15 NOTE — Telephone Encounter (Signed)
Pt states Semaglutide,0.25 or 0.'5MG'$ /DOS, (OZEMPIC, 0.25 OR 0.5 MG/DOSE,) 2 MG/3ML SOPN is on back order and she is requesting a substitution. Asking if you have samples

## 2022-10-16 DIAGNOSIS — M25611 Stiffness of right shoulder, not elsewhere classified: Secondary | ICD-10-CM | POA: Diagnosis not present

## 2022-10-16 DIAGNOSIS — M6281 Muscle weakness (generalized): Secondary | ICD-10-CM | POA: Diagnosis not present

## 2022-10-16 DIAGNOSIS — M25511 Pain in right shoulder: Secondary | ICD-10-CM | POA: Diagnosis not present

## 2022-10-19 DIAGNOSIS — M25511 Pain in right shoulder: Secondary | ICD-10-CM | POA: Diagnosis not present

## 2022-10-19 DIAGNOSIS — M6281 Muscle weakness (generalized): Secondary | ICD-10-CM | POA: Diagnosis not present

## 2022-10-19 DIAGNOSIS — M25611 Stiffness of right shoulder, not elsewhere classified: Secondary | ICD-10-CM | POA: Diagnosis not present

## 2022-10-21 ENCOUNTER — Other Ambulatory Visit: Payer: Self-pay | Admitting: Internal Medicine

## 2022-10-21 DIAGNOSIS — I10 Essential (primary) hypertension: Secondary | ICD-10-CM

## 2022-10-21 DIAGNOSIS — M25611 Stiffness of right shoulder, not elsewhere classified: Secondary | ICD-10-CM | POA: Diagnosis not present

## 2022-10-21 DIAGNOSIS — M25511 Pain in right shoulder: Secondary | ICD-10-CM | POA: Diagnosis not present

## 2022-10-21 DIAGNOSIS — M6281 Muscle weakness (generalized): Secondary | ICD-10-CM | POA: Diagnosis not present

## 2022-10-22 DIAGNOSIS — M6281 Muscle weakness (generalized): Secondary | ICD-10-CM | POA: Diagnosis not present

## 2022-10-22 DIAGNOSIS — M25511 Pain in right shoulder: Secondary | ICD-10-CM | POA: Diagnosis not present

## 2022-10-22 DIAGNOSIS — M25611 Stiffness of right shoulder, not elsewhere classified: Secondary | ICD-10-CM | POA: Diagnosis not present

## 2022-10-26 DIAGNOSIS — M6281 Muscle weakness (generalized): Secondary | ICD-10-CM | POA: Diagnosis not present

## 2022-10-26 DIAGNOSIS — M25511 Pain in right shoulder: Secondary | ICD-10-CM | POA: Diagnosis not present

## 2022-10-26 DIAGNOSIS — M25611 Stiffness of right shoulder, not elsewhere classified: Secondary | ICD-10-CM | POA: Diagnosis not present

## 2022-10-28 DIAGNOSIS — M6281 Muscle weakness (generalized): Secondary | ICD-10-CM | POA: Diagnosis not present

## 2022-10-28 DIAGNOSIS — M25511 Pain in right shoulder: Secondary | ICD-10-CM | POA: Diagnosis not present

## 2022-10-28 DIAGNOSIS — M25611 Stiffness of right shoulder, not elsewhere classified: Secondary | ICD-10-CM | POA: Diagnosis not present

## 2022-11-03 DIAGNOSIS — M25511 Pain in right shoulder: Secondary | ICD-10-CM | POA: Diagnosis not present

## 2022-11-03 DIAGNOSIS — M6281 Muscle weakness (generalized): Secondary | ICD-10-CM | POA: Diagnosis not present

## 2022-11-03 DIAGNOSIS — M25611 Stiffness of right shoulder, not elsewhere classified: Secondary | ICD-10-CM | POA: Diagnosis not present

## 2022-11-04 DIAGNOSIS — M6281 Muscle weakness (generalized): Secondary | ICD-10-CM | POA: Diagnosis not present

## 2022-11-04 DIAGNOSIS — M25511 Pain in right shoulder: Secondary | ICD-10-CM | POA: Diagnosis not present

## 2022-11-04 DIAGNOSIS — M25611 Stiffness of right shoulder, not elsewhere classified: Secondary | ICD-10-CM | POA: Diagnosis not present

## 2022-11-05 DIAGNOSIS — M25611 Stiffness of right shoulder, not elsewhere classified: Secondary | ICD-10-CM | POA: Diagnosis not present

## 2022-11-05 DIAGNOSIS — M25511 Pain in right shoulder: Secondary | ICD-10-CM | POA: Diagnosis not present

## 2022-11-05 DIAGNOSIS — M6281 Muscle weakness (generalized): Secondary | ICD-10-CM | POA: Diagnosis not present

## 2022-11-08 DIAGNOSIS — M6281 Muscle weakness (generalized): Secondary | ICD-10-CM | POA: Diagnosis not present

## 2022-11-08 DIAGNOSIS — M25511 Pain in right shoulder: Secondary | ICD-10-CM | POA: Diagnosis not present

## 2022-11-08 DIAGNOSIS — M25611 Stiffness of right shoulder, not elsewhere classified: Secondary | ICD-10-CM | POA: Diagnosis not present

## 2022-11-10 DIAGNOSIS — M25511 Pain in right shoulder: Secondary | ICD-10-CM | POA: Diagnosis not present

## 2022-11-10 DIAGNOSIS — M6281 Muscle weakness (generalized): Secondary | ICD-10-CM | POA: Diagnosis not present

## 2022-11-10 DIAGNOSIS — M25611 Stiffness of right shoulder, not elsewhere classified: Secondary | ICD-10-CM | POA: Diagnosis not present

## 2022-11-17 DIAGNOSIS — M6281 Muscle weakness (generalized): Secondary | ICD-10-CM | POA: Diagnosis not present

## 2022-11-17 DIAGNOSIS — M25611 Stiffness of right shoulder, not elsewhere classified: Secondary | ICD-10-CM | POA: Diagnosis not present

## 2022-11-17 DIAGNOSIS — M25511 Pain in right shoulder: Secondary | ICD-10-CM | POA: Diagnosis not present

## 2022-11-19 ENCOUNTER — Telehealth: Payer: Self-pay | Admitting: Internal Medicine

## 2022-11-19 DIAGNOSIS — M25611 Stiffness of right shoulder, not elsewhere classified: Secondary | ICD-10-CM | POA: Diagnosis not present

## 2022-11-19 DIAGNOSIS — M25511 Pain in right shoulder: Secondary | ICD-10-CM | POA: Diagnosis not present

## 2022-11-19 DIAGNOSIS — M6281 Muscle weakness (generalized): Secondary | ICD-10-CM | POA: Diagnosis not present

## 2022-11-19 NOTE — Telephone Encounter (Signed)
Pt is calling and would like to sch b12 injection. Please advise

## 2022-11-23 ENCOUNTER — Ambulatory Visit (INDEPENDENT_AMBULATORY_CARE_PROVIDER_SITE_OTHER): Payer: BC Managed Care – PPO | Admitting: Surgical

## 2022-11-23 ENCOUNTER — Encounter: Payer: Self-pay | Admitting: Orthopedic Surgery

## 2022-11-23 DIAGNOSIS — M75121 Complete rotator cuff tear or rupture of right shoulder, not specified as traumatic: Secondary | ICD-10-CM

## 2022-11-23 NOTE — Progress Notes (Signed)
Post-Op Visit Note   Patient: Katie Morse           Date of Birth: 11/14/1966           MRN: [id number] Visit Date: 11/23/2022 PCP: Isaac Bliss, Rayford Halsted, MD   Assessment & Plan:  Chief Complaint:  Chief Complaint  Patient presents with   Right Shoulder - Routine Post Op   Visit Diagnoses:  1. Complete tear of right rotator cuff, unspecified whether traumatic     Plan: Patient is a 56 year old female who returns s/p right shoulder arthroscopy, debridement, biceps tenodesis, mini open rotator cuff tear repair on 08/13/2022.  She states she has some good days and bad days.  Occasional popping sensation in the shoulder this not new for her.  Physical therapy 2 times per week where mostly working on range of motion exercises and they have started strengthening within the last 2-1/2 to 3 weeks.  She still notes some difficulty with using her arm when putting her bra on but she feels she is making progressive improvement.  Takes Tylenol arthritis and uses Voltaren gel and Biofreeze and also occasional use of diclofenac and Flexeril.  Ortho exam demonstrates 45 degrees X rotation, 80 degrees abduction, 170 degrees forward flexion.  5 -/5 infraspinatus and supraspinatus strength.  5/5 subscapularis strength.  Incisions are well-healed.  Axillary nerve intact with deltoid firing.  Intact EPL, FPL, finger abduction, grip strength testing, pronation/supination, bicep, tricep, deltoid.  Active range of motion equivalent to passive range of motion.  Plan is to continue with out of work until return to work on 01/05/2023 where she will return to work with no lifting more than 15 pounds and no lifting overhead.  Follow-up in 6 weeks for clinical recheck which will be about 2 weeks after she returns to work so we can check on how she is doing.  Follow-Up Instructions: No follow-ups on file.   Orders:  No orders of the defined types were placed in this encounter.  No orders of the  defined types were placed in this encounter.   Imaging: No results found.  PMFS History: Patient Active Problem List   Diagnosis Date Noted   Complete tear of right rotator cuff    Biceps tendinitis on right    Degenerative superior labral anterior-to-posterior (SLAP) tear of right shoulder    Vitamin B12 deficiency 08/13/2021   Chronic pain of both knees 04/30/2020   Vitamin D deficiency 09/22/2019   Chest pain 08/03/2019   Chronic left shoulder pain 02/22/2019   Neck pain 02/22/2019   Hyperlipidemia associated with type 2 diabetes mellitus ([place]) 02/17/2019   Morbid obesity ([place]) 02/17/2019   DM (diabetes mellitus), type 2 ([place]) 08/04/2016   Obesity, unspecified 09/05/2013   LEIOMYOMA, UTERUS 08/20/2008   SICKLE-CELL TRAIT 08/20/2008   Essential hypertension 08/20/2008   Past Medical History:  Diagnosis Date   ALLERGIC RHINITIS 08/20/2008   Allergy    Anemia    Arthritis    Blood transfusion without reported diagnosis 2011   post surgery   Diabetes mellitus without complication ([place])    DYSPNEA 11/12/2008   Dysrhythmia    a. fib   Headache(784.0) 08/20/2008   HYPERTENSION 08/20/2008   LEIOMYOMA, UTERUS 08/20/2008   PALPITATIONS, RECURRENT 11/12/2008   Sickle-cell trait ([place]) 89/38/1017   SYSTOLIC MURMUR 51/01/5851    Family History  Problem Relation Age of Onset   Hypertension Mother    Heart Problems Mother    Cancer Father  lung and prostate ca   Diabetes Other    Breast cancer Cousin        2-twins   Colon cancer Neg Hx     Past Surgical History:  Procedure Laterality Date   LIPOMA EXCISION Left 07/19/2019   Procedure: EXCISION OF SUBCUTANEOUS LIPOMA LEFT BUTTOCK;  Surgeon: Donnie Mesa, MD;  Location: [facility];  Service: General;  Laterality: Left;   MYOMECTOMY     fibroids   SHOULDER ARTHROSCOPY WITH OPEN ROTATOR CUFF REPAIR AND DISTAL CLAVICLE ACROMINECTOMY Right 08/13/2022   Procedure: right shoulder arthroscopy,  debridement, biceps tenodesis, mini open rotator cuff tear repair;  Surgeon: Meredith Pel, MD;  Location: [facility];  Service: Orthopedics;  Laterality: Right;   Social History   Occupational History   Not on file  Tobacco Use   Smoking status: Never   Smokeless tobacco: Never  Vaping Use   Vaping Use: Never used  Substance and Sexual Activity   Alcohol use: Yes    Alcohol/week: 0.0 standard drinks of alcohol    Comment: rarely   Drug use: No   Sexual activity: Not on file

## 2022-11-24 DIAGNOSIS — M25611 Stiffness of right shoulder, not elsewhere classified: Secondary | ICD-10-CM | POA: Diagnosis not present

## 2022-11-24 DIAGNOSIS — M6281 Muscle weakness (generalized): Secondary | ICD-10-CM | POA: Diagnosis not present

## 2022-11-24 DIAGNOSIS — M25511 Pain in right shoulder: Secondary | ICD-10-CM | POA: Diagnosis not present

## 2022-11-24 NOTE — Telephone Encounter (Signed)
Appointment scheduled.

## 2022-11-24 NOTE — Telephone Encounter (Signed)
Pt called, returning CMA's call. CMA was unavailable. Pt asked that CMA call back at their earliest convenience. 

## 2022-11-24 NOTE — Telephone Encounter (Signed)
Left message on machine returning patient's call. Okay to schedule B12 injection.

## 2022-11-24 NOTE — Telephone Encounter (Signed)
Left detailed message on machine for patient to schedule her B12 injections.

## 2022-11-26 ENCOUNTER — Ambulatory Visit (INDEPENDENT_AMBULATORY_CARE_PROVIDER_SITE_OTHER): Payer: BC Managed Care – PPO

## 2022-11-26 DIAGNOSIS — E538 Deficiency of other specified B group vitamins: Secondary | ICD-10-CM | POA: Diagnosis not present

## 2022-11-26 DIAGNOSIS — M6281 Muscle weakness (generalized): Secondary | ICD-10-CM | POA: Diagnosis not present

## 2022-11-26 DIAGNOSIS — M25511 Pain in right shoulder: Secondary | ICD-10-CM | POA: Diagnosis not present

## 2022-11-26 DIAGNOSIS — M25611 Stiffness of right shoulder, not elsewhere classified: Secondary | ICD-10-CM | POA: Diagnosis not present

## 2022-11-26 MED ORDER — CYANOCOBALAMIN 1000 MCG/ML IJ SOLN
1000.0000 ug | INTRAMUSCULAR | Status: AC
  Administered 2022-11-26: 1000 ug via INTRAMUSCULAR

## 2022-11-26 NOTE — Progress Notes (Signed)
Pt here for monthly B12 injection per Dr Jerilee Hoh.  B12 1064mg given IM right deltoid and pt tolerated injection well.  Next B12 injection scheduled for 12/31/22 during OV.

## 2022-12-01 DIAGNOSIS — M25511 Pain in right shoulder: Secondary | ICD-10-CM | POA: Diagnosis not present

## 2022-12-01 DIAGNOSIS — M25611 Stiffness of right shoulder, not elsewhere classified: Secondary | ICD-10-CM | POA: Diagnosis not present

## 2022-12-01 DIAGNOSIS — M6281 Muscle weakness (generalized): Secondary | ICD-10-CM | POA: Diagnosis not present

## 2022-12-03 DIAGNOSIS — M6281 Muscle weakness (generalized): Secondary | ICD-10-CM | POA: Diagnosis not present

## 2022-12-03 DIAGNOSIS — M25511 Pain in right shoulder: Secondary | ICD-10-CM | POA: Diagnosis not present

## 2022-12-03 DIAGNOSIS — M25611 Stiffness of right shoulder, not elsewhere classified: Secondary | ICD-10-CM | POA: Diagnosis not present

## 2022-12-04 ENCOUNTER — Other Ambulatory Visit: Payer: Self-pay | Admitting: Internal Medicine

## 2022-12-04 DIAGNOSIS — G8929 Other chronic pain: Secondary | ICD-10-CM

## 2022-12-07 NOTE — Telephone Encounter (Signed)
Last OV 09/30/22

## 2022-12-09 DIAGNOSIS — M25611 Stiffness of right shoulder, not elsewhere classified: Secondary | ICD-10-CM | POA: Diagnosis not present

## 2022-12-09 DIAGNOSIS — M25511 Pain in right shoulder: Secondary | ICD-10-CM | POA: Diagnosis not present

## 2022-12-09 DIAGNOSIS — M6281 Muscle weakness (generalized): Secondary | ICD-10-CM | POA: Diagnosis not present

## 2022-12-16 DIAGNOSIS — M6281 Muscle weakness (generalized): Secondary | ICD-10-CM | POA: Diagnosis not present

## 2022-12-16 DIAGNOSIS — M25511 Pain in right shoulder: Secondary | ICD-10-CM | POA: Diagnosis not present

## 2022-12-16 DIAGNOSIS — M25611 Stiffness of right shoulder, not elsewhere classified: Secondary | ICD-10-CM | POA: Diagnosis not present

## 2022-12-22 DIAGNOSIS — M25511 Pain in right shoulder: Secondary | ICD-10-CM | POA: Diagnosis not present

## 2022-12-22 DIAGNOSIS — M25611 Stiffness of right shoulder, not elsewhere classified: Secondary | ICD-10-CM | POA: Diagnosis not present

## 2022-12-22 DIAGNOSIS — M6281 Muscle weakness (generalized): Secondary | ICD-10-CM | POA: Diagnosis not present

## 2022-12-24 ENCOUNTER — Telehealth: Payer: Self-pay | Admitting: Orthopedic Surgery

## 2022-12-24 NOTE — Telephone Encounter (Signed)
Patient is requesting a letter pertaining to the 08-13-22 surgery with Dr. Marlou Sa. She requires this letter to show the eligibility or need of benefits.  She must provide proof of income-which she states is nothing since she was out of work because of surgery.  Please call patient if more details are needed.  [phone]

## 2022-12-25 ENCOUNTER — Telehealth: Payer: Self-pay | Admitting: Orthopedic Surgery

## 2022-12-25 DIAGNOSIS — M25511 Pain in right shoulder: Secondary | ICD-10-CM | POA: Diagnosis not present

## 2022-12-25 DIAGNOSIS — M25611 Stiffness of right shoulder, not elsewhere classified: Secondary | ICD-10-CM | POA: Diagnosis not present

## 2022-12-25 DIAGNOSIS — M6281 Muscle weakness (generalized): Secondary | ICD-10-CM | POA: Diagnosis not present

## 2022-12-25 NOTE — Telephone Encounter (Signed)
Patient called again needing this letter for her social services foodstamps. (This is last note from [place]. Please call pt when letter is ready for pick up. Patient is requesting a letter pertaining to the 08-13-22 surgery with Dr. Marlou Sa. She requires this letter to show the eligibility or need of benefits. She must provide proof of income-which she states is nothing since she was out of work because of surgery. Please call patient if more details are needed. [phone])

## 2022-12-25 NOTE — Telephone Encounter (Signed)
See other note. Waiting to be advised by Luke/Dr Marlou Sa

## 2022-12-26 NOTE — Telephone Encounter (Signed)
Oow for 5 months after surgery  thx

## 2022-12-28 NOTE — Telephone Encounter (Signed)
Note generated and sent through mychart to patient.

## 2022-12-30 DIAGNOSIS — M25611 Stiffness of right shoulder, not elsewhere classified: Secondary | ICD-10-CM | POA: Diagnosis not present

## 2022-12-30 DIAGNOSIS — M6281 Muscle weakness (generalized): Secondary | ICD-10-CM | POA: Diagnosis not present

## 2022-12-30 DIAGNOSIS — M25511 Pain in right shoulder: Secondary | ICD-10-CM | POA: Diagnosis not present

## 2022-12-31 ENCOUNTER — Ambulatory Visit (INDEPENDENT_AMBULATORY_CARE_PROVIDER_SITE_OTHER): Payer: BC Managed Care – PPO | Admitting: Internal Medicine

## 2022-12-31 VITALS — BP 154/103 | HR 80 | Temp 98.3°F | Wt 189.1 lb

## 2022-12-31 DIAGNOSIS — E785 Hyperlipidemia, unspecified: Secondary | ICD-10-CM | POA: Diagnosis not present

## 2022-12-31 DIAGNOSIS — E1169 Type 2 diabetes mellitus with other specified complication: Secondary | ICD-10-CM | POA: Diagnosis not present

## 2022-12-31 DIAGNOSIS — I10 Essential (primary) hypertension: Secondary | ICD-10-CM

## 2022-12-31 LAB — POCT GLYCOSYLATED HEMOGLOBIN (HGB A1C): Hemoglobin A1C: 12.4 % — AB (ref 4.0–5.6)

## 2022-12-31 MED ORDER — TRULICITY 0.75 MG/0.5ML ~~LOC~~ SOAJ: 0.7500 mg | SUBCUTANEOUS | 2 refills | Status: DC

## 2022-12-31 MED ORDER — TIRZEPATIDE 2.5 MG/0.5ML ~~LOC~~ SOAJ: 2.5000 mg | SUBCUTANEOUS | 2 refills | Status: DC

## 2022-12-31 NOTE — Progress Notes (Signed)
Established Patient Office Visit     CC/Reason for Visit: Follow-up chronic conditions  HPI: Katie Morse is a 57 y.o. female who is coming in today for the above mentioned reasons. Past Medical History is significant for: Hypertension, hyperlipidemia, type 2 diabetes, obesity.  She has been unable to tolerate metformin due to GI upset and diarrhea.  She was prescribed semaglutide at last visit, however was unable to afford.  Unsurprisingly, her A1c has risen to 12.4.  In addition her blood pressure is very elevated today on 2 separate measurements.  She is not doing ambulatory blood pressure measurement.  She states she has been compliant with her antihypertensive regimen which includes amlodipine 5 mg, hydrochlorothiazide 25 mg, losartan 100 mg and metoprolol 25 mg twice daily.  She denies headaches, chest pain, shortness of breath or focal neurologic deficits.   Past Medical/Surgical History: Past Medical History:  Diagnosis Date   ALLERGIC RHINITIS 08/20/2008   Allergy    Anemia    Arthritis    Blood transfusion without reported diagnosis 2011   post surgery   Diabetes mellitus without complication ([place])    DYSPNEA 11/12/2008   Dysrhythmia    a. fib   Headache(784.0) 08/20/2008   HYPERTENSION 08/20/2008   LEIOMYOMA, UTERUS 08/20/2008   PALPITATIONS, RECURRENT 11/12/2008   Sickle-cell trait ([place]) 02/72/5366   SYSTOLIC MURMUR 44/02/4741    Past Surgical History:  Procedure Laterality Date   LIPOMA EXCISION Left 07/19/2019   Procedure: EXCISION OF SUBCUTANEOUS LIPOMA LEFT BUTTOCK;  Surgeon: Donnie Mesa, MD;  Location: [facility];  Service: General;  Laterality: Left;   MYOMECTOMY     fibroids   SHOULDER ARTHROSCOPY WITH OPEN ROTATOR CUFF REPAIR AND DISTAL CLAVICLE ACROMINECTOMY Right 08/13/2022   Procedure: right shoulder arthroscopy, debridement, biceps tenodesis, mini open rotator cuff tear repair;  Surgeon: Meredith Pel, MD;   Location: [facility];  Service: Orthopedics;  Laterality: Right;    Social History:  reports that she has never smoked. She has never used smokeless tobacco. She reports current alcohol use. She reports that she does not use drugs.  Allergies: Allergies  Allergen Reactions   Latex Rash   Wild Lettuce Extract (Lactuca Virosa) Other (See Comments)    Gi -upset Other reaction(s): Other Gi -upset   Avocado Nausea Only   Lactose Intolerance (Gi)     Gi-upset   Metformin Hcl Diarrhea   Other Other (See Comments)    Harriet Masson /Gi upset   Watermelon Flavor     Gi upset    Family History:  Family History  Problem Relation Age of Onset   Hypertension Mother    Heart Problems Mother    Cancer Father        lung and prostate ca   Diabetes Other    Breast cancer Cousin        2-twins   Colon cancer Neg Hx      Current Outpatient Medications:    acetaminophen (TYLENOL) 650 MG CR tablet, Take 650-1,300 mg by mouth every 8 (eight) hours as needed for pain., Disp: , Rfl:    albuterol (VENTOLIN HFA) 108 (90 Base) MCG/ACT inhaler, INHALE 1 PUFF INTO THE LUNGS EVERY 6 HOURS AS NEEDED FOR WHEEZING OR SHORTNESS OF BREATH., Disp: 6.7 each, Rfl: 3   amLODipine (NORVASC) 5 MG tablet, Take 1 tablet (5 mg total) by mouth daily., Disp: 90 tablet, Rfl: 1   ASPIRIN LOW DOSE 81 MG EC tablet, TAKE 1 TABLET  BY MOUTH EVERY DAY, Disp: 30 tablet, Rfl: 11   Aspirin-Caffeine (BAYER BACK & BODY) 500-32.5 MG TABS, Take 2 tablets by mouth at bedtime as needed (Pain)., Disp: , Rfl:    atorvastatin (LIPITOR) 80 MG tablet, Take 1 tablet (80 mg total) by mouth at bedtime., Disp: 90 tablet, Rfl: 1   Cranberry 250 MG CAPS, Orally, Disp: , Rfl:    Cyanocobalamin (VITAMIN B-12 IJ), Inject 1 mL as directed every 30 (thirty) days., Disp: , Rfl:    cyclobenzaprine (FLEXERIL) 5 MG tablet, TAKE 1 TABLET BY MOUTH AT BEDTIME AS NEEDED FOR MUSCLE SPASMS., Disp: 30 tablet, Rfl: 1   diclofenac (VOLTAREN) 75 MG EC tablet, TAKE 1  TABLET BY MOUTH TWICE A DAY, Disp: 60 tablet, Rfl: 0   diclofenac Sodium (VOLTAREN) 1 % GEL, Apply 2 g topically daily as needed (arm pain)., Disp: , Rfl:    Dulaglutide (TRULICITY) 7.10 GY/6.9SW SOPN, Inject 0.75 mg into the skin once a week., Disp: 2 mL, Rfl: 2   ezetimibe (ZETIA) 10 MG tablet, Take 1 tablet (10 mg total) by mouth daily., Disp: 90 tablet, Rfl: 1   fluticasone (FLONASE) 50 MCG/ACT nasal spray, Place 2 sprays into both nostrils daily as needed for allergies., Disp: , Rfl:    fluticasone (FLONASE) 50 MCG/ACT nasal spray, Place into the nose., Disp: , Rfl:    hydrochlorothiazide (HYDRODIURIL) 25 MG tablet, TAKE 1 TABLET (25 MG TOTAL) BY MOUTH DAILY., Disp: 90 tablet, Rfl: 1   HYDROcodone-acetaminophen (NORCO) 5-325 MG tablet, Take 1 tablet by mouth every 6 (six) hours as needed for moderate pain., Disp: 35 tablet, Rfl: 0   hydrocortisone 2.5 % cream, Apply topically 2 (two) times daily., Disp: 30 g, Rfl: 0   ketorolac (ACULAR) 0.5 % ophthalmic solution, SMARTSIG:In Eye(s), Disp: , Rfl:    losartan (COZAAR) 100 MG tablet, Take 1 tablet (100 mg total) by mouth daily., Disp: 90 tablet, Rfl: 1   metoprolol tartrate (LOPRESSOR) 25 MG tablet, TAKE 1 TABLET BY MOUTH TWICE A DAY, Disp: 180 tablet, Rfl: 0   moxifloxacin (VIGAMOX) 0.5 % ophthalmic solution, Apply to eye., Disp: , Rfl:    Multiple Vitamins-Minerals (HAIR/SKIN/NAILS/BIOTIN PO), Orally, Disp: , Rfl:    nitroGLYCERIN (NITROSTAT) 0.4 MG SL tablet, Place 0.4 mg under the tongue as needed for chest pain., Disp: , Rfl:    Omega-3 Fatty Acids (FISH OIL) 500 MG CAPS, 1 capsule Orally Twice a day, Disp: , Rfl:    Polyvinyl Alcohol-Povidone PF (REFRESH) 1.4-0.6 % SOLN, Place 1 drop into both eyes in the morning, at noon, in the evening, and at bedtime., Disp: , Rfl:    POTASSIUM PO, Potassium, Disp: , Rfl:    prednisoLONE acetate (PRED FORTE) 1 % ophthalmic suspension, one drop 4 (four) times daily., Disp: , Rfl:    Prenatal Vit-Fe  Fumarate-FA (PRENATAL MULTIVITAMIN) TABS tablet, Take 1 tablet by mouth daily at 12 noon., Disp: , Rfl:    trimethoprim-polymyxin b (POLYTRIM) ophthalmic solution, Place 2 drops into both eyes every 4 (four) hours., Disp: 10 mL, Rfl: 0   vitamin C (ASCORBIC ACID) 500 MG tablet, Take 500 mg by mouth daily., Disp: , Rfl:    vitamin E 200 UNIT capsule, one capsule (200 Units dose)., Disp: , Rfl:   Current Facility-Administered Medications:    cyanocobalamin (VITAMIN B12) injection 1,000 mcg, 1,000 mcg, Intramuscular, Q30 days, Isaac Bliss, Rayford Halsted, MD, 1,000 mcg at 11/26/22 1410  Review of Systems:  Constitutional: Denies fever, chills, diaphoresis, appetite  change and fatigue.  HEENT: Denies photophobia, eye pain, redness, hearing loss, ear pain, congestion, sore throat, rhinorrhea, sneezing, mouth sores, trouble swallowing, neck pain, neck stiffness and tinnitus.   Respiratory: Denies SOB, DOE, cough, chest tightness,  and wheezing.   Cardiovascular: Denies chest pain, palpitations and leg swelling.  Gastrointestinal: Denies nausea, vomiting, abdominal pain, diarrhea, constipation, blood in stool and abdominal distention.  Genitourinary: Denies dysuria, urgency, frequency, hematuria, flank pain and difficulty urinating.  Endocrine: Denies: hot or cold intolerance, sweats, changes in hair or nails, polyuria, polydipsia. Musculoskeletal: Denies myalgias, back pain, joint swelling, arthralgias and gait problem.  Skin: Denies pallor, rash and wound.  Neurological: Denies dizziness, seizures, syncope, weakness, light-headedness, numbness and headaches.  Hematological: Denies adenopathy. Easy bruising, personal or family bleeding history  Psychiatric/Behavioral: Denies suicidal ideation, mood changes, confusion, nervousness, sleep disturbance and agitation    Physical Exam: Vitals:   12/31/22 1046 12/31/22 1055  BP: (!) 170/106 (!) 154/103  Pulse: 80   Temp: 98.3 F (36.8 C)   TempSrc:  Oral   SpO2: 98%   Weight: 189 lb 1.6 oz (85.8 kg)     Body mass index is 34.59 kg/m.   Constitutional: NAD, calm, comfortable Eyes: PERRL, lids and conjunctivae normal ENMT: Mucous membranes are moist.  Respiratory: clear to auscultation bilaterally, no wheezing, no crackles. Normal respiratory effort. No accessory muscle use.  Cardiovascular: Regular rate and rhythm, no murmurs / rubs / gallops. No extremity edema.   Psychiatric: Normal judgment and insight. Alert and oriented x 3. Normal mood.    Impression and Plan:  Type 2 diabetes mellitus with other specified complication, without long-term current use of insulin ([place]) - Plan: POCT glycosylated hemoglobin (Hb A1C), Dulaglutide (TRULICITY) 7.00 FV/4.9SW SOPN, DISCONTINUED: tirzepatide (MOUNJARO) 2.5 MG/0.5ML Pen  Morbid obesity ([facility])  Essential hypertension  Hyperlipidemia associated with type 2 diabetes mellitus ([facility])  -A1c of 12.4 is uncontrolled which is unsurprising as she is no longer able to tolerate metformin due to GI upset and was not able to afford semaglutide. -Will attempt Trulicity which should be covered under her Medicaid plan, she will start at 0.75 mg weekly. -Blood pressure is uncontrolled.  She has not taken medication today.  She states she has been adherent, lots of social stressors.  I will have her return in 2 to 3 weeks with ambulatory blood pressure measurements. -She continues on maximum atorvastatin and ezetimibe, recheck lipids when she returns fasting. -Discussed healthy lifestyle, including increased physical activity and better food choices to promote weight loss.   Time spent:34 minutes reviewing chart, interviewing and examining patient and formulating plan of care.      Lelon Frohlich, MD [facility]

## 2023-01-01 DIAGNOSIS — M25611 Stiffness of right shoulder, not elsewhere classified: Secondary | ICD-10-CM | POA: Diagnosis not present

## 2023-01-01 DIAGNOSIS — M6281 Muscle weakness (generalized): Secondary | ICD-10-CM | POA: Diagnosis not present

## 2023-01-01 DIAGNOSIS — M25511 Pain in right shoulder: Secondary | ICD-10-CM | POA: Diagnosis not present

## 2023-01-04 DIAGNOSIS — M25611 Stiffness of right shoulder, not elsewhere classified: Secondary | ICD-10-CM | POA: Diagnosis not present

## 2023-01-04 DIAGNOSIS — M25511 Pain in right shoulder: Secondary | ICD-10-CM | POA: Diagnosis not present

## 2023-01-04 DIAGNOSIS — M6281 Muscle weakness (generalized): Secondary | ICD-10-CM | POA: Diagnosis not present

## 2023-01-05 ENCOUNTER — Other Ambulatory Visit: Payer: Self-pay | Admitting: Internal Medicine

## 2023-01-06 DIAGNOSIS — M25611 Stiffness of right shoulder, not elsewhere classified: Secondary | ICD-10-CM | POA: Diagnosis not present

## 2023-01-06 DIAGNOSIS — M6281 Muscle weakness (generalized): Secondary | ICD-10-CM | POA: Diagnosis not present

## 2023-01-06 DIAGNOSIS — M25511 Pain in right shoulder: Secondary | ICD-10-CM | POA: Diagnosis not present

## 2023-01-11 DIAGNOSIS — M6281 Muscle weakness (generalized): Secondary | ICD-10-CM | POA: Diagnosis not present

## 2023-01-11 DIAGNOSIS — M25511 Pain in right shoulder: Secondary | ICD-10-CM | POA: Diagnosis not present

## 2023-01-11 DIAGNOSIS — M25611 Stiffness of right shoulder, not elsewhere classified: Secondary | ICD-10-CM | POA: Diagnosis not present

## 2023-01-13 DIAGNOSIS — M6281 Muscle weakness (generalized): Secondary | ICD-10-CM | POA: Diagnosis not present

## 2023-01-13 DIAGNOSIS — M25511 Pain in right shoulder: Secondary | ICD-10-CM | POA: Diagnosis not present

## 2023-01-13 DIAGNOSIS — M25611 Stiffness of right shoulder, not elsewhere classified: Secondary | ICD-10-CM | POA: Diagnosis not present

## 2023-01-14 ENCOUNTER — Telehealth: Payer: Self-pay | Admitting: Orthopedic Surgery

## 2023-01-14 NOTE — Telephone Encounter (Signed)
Received call from patient checking on her request for a note. I advised patient note is ready and was sent to her through mychart. She would like to pick it up. Note placed at front desk for patient to pick up.

## 2023-01-18 DIAGNOSIS — M25611 Stiffness of right shoulder, not elsewhere classified: Secondary | ICD-10-CM | POA: Diagnosis not present

## 2023-01-18 DIAGNOSIS — M25511 Pain in right shoulder: Secondary | ICD-10-CM | POA: Diagnosis not present

## 2023-01-18 DIAGNOSIS — M6281 Muscle weakness (generalized): Secondary | ICD-10-CM | POA: Diagnosis not present

## 2023-01-19 DIAGNOSIS — M25611 Stiffness of right shoulder, not elsewhere classified: Secondary | ICD-10-CM | POA: Diagnosis not present

## 2023-01-19 DIAGNOSIS — M6281 Muscle weakness (generalized): Secondary | ICD-10-CM | POA: Diagnosis not present

## 2023-01-19 DIAGNOSIS — M25511 Pain in right shoulder: Secondary | ICD-10-CM | POA: Diagnosis not present

## 2023-01-20 ENCOUNTER — Ambulatory Visit (INDEPENDENT_AMBULATORY_CARE_PROVIDER_SITE_OTHER): Payer: BC Managed Care – PPO | Admitting: Orthopedic Surgery

## 2023-01-20 ENCOUNTER — Encounter: Payer: Self-pay | Admitting: Orthopedic Surgery

## 2023-01-20 DIAGNOSIS — M75121 Complete rotator cuff tear or rupture of right shoulder, not specified as traumatic: Secondary | ICD-10-CM | POA: Diagnosis not present

## 2023-01-20 NOTE — Progress Notes (Signed)
Office Visit Note   Patient: Katie Morse           Date of Birth: 12/20/1966           MRN: [id number] Visit Date: 01/20/2023 Requested by: Isaac Bliss, Rayford Halsted, MD [place],  [place] [address] PCP: Isaac Bliss, Rayford Halsted, MD  Subjective: Chief Complaint  Patient presents with   Right Shoulder - Routine Post Op    HPI: Katie Morse is a 57 y.o. female who presents to the office reporting mild right shoulder pain.  She underwent right shoulder arthroscopy debridement biceps tenodesis and mini open rotator cuff tear repair about 5 months ago.  Soreness is level 5.  Takes Tylenol and Voltaren tablets.  Hard for her to lay on that right-hand side.  She would like to get back to work at [employer] today..                ROS: All systems reviewed are negative as they relate to the chief complaint within the history of present illness.  Patient denies fevers or chills.  Assessment & Plan: Visit Diagnoses:  1. Complete tear of right rotator cuff, unspecified whether traumatic     Plan: Impression is right shoulder doing well following surgery.  She has good range of motion and good strength.  I would like her to be careful doing a lot of heavy lifting away from her body at this [place].  Continue with home exercises and follow-up as needed  Follow-Up Instructions: No follow-ups on file.   Orders:  No orders of the defined types were placed in this encounter.  No orders of the defined types were placed in this encounter.     Procedures: No procedures performed   Clinical Data: No additional findings.  Objective: Vital Signs: LMP 07/04/2014 Comment: GYN  Physical Exam:  Constitutional: Patient appears well-developed HEENT:  Head: Normocephalic Eyes:EOM are normal Neck: Normal range of motion Cardiovascular: Normal rate Pulmonary/chest: Effort normal Neurologic: Patient is alert Skin: Skin is warm Psychiatric:  Patient has normal mood and affect  Ortho Exam: Ortho exam demonstrates range of motion on the right of 45/100/170.  Very good rotator cuff strength infraspinatus supraspinatus and subscap muscle testing.  No masses lymphadenopathy or skin changes noted in that shoulder region.  Specialty Comments:  No specialty comments available.  Imaging: No results found.   PMFS History: Patient Active Problem List   Diagnosis Date Noted   Complete tear of right rotator cuff    Biceps tendinitis on right    Degenerative superior labral anterior-to-posterior (SLAP) tear of right shoulder    Vitamin B12 deficiency 08/13/2021   Chronic pain of both knees 04/30/2020   Vitamin D deficiency 09/22/2019   Chest pain 08/03/2019   Chronic left shoulder pain 02/22/2019   Neck pain 02/22/2019   Hyperlipidemia associated with type 2 diabetes mellitus ([place]) 02/17/2019   Morbid obesity ([place]) 02/17/2019   DM (diabetes mellitus), type 2 ([place]) 08/04/2016   Obesity, unspecified 09/05/2013   LEIOMYOMA, UTERUS 08/20/2008   SICKLE-CELL TRAIT 08/20/2008   Essential hypertension 08/20/2008   Past Medical History:  Diagnosis Date   ALLERGIC RHINITIS 08/20/2008   Allergy    Anemia    Arthritis    Blood transfusion without reported diagnosis 2011   post surgery   Diabetes mellitus without complication ([place])    DYSPNEA 11/12/2008   Dysrhythmia    a. fib   Headache(784.0) 08/20/2008   HYPERTENSION  08/20/2008   LEIOMYOMA, UTERUS 08/20/2008   PALPITATIONS, RECURRENT 11/12/2008   Sickle-cell trait ([place]) 04/30/210   SYSTOLIC MURMUR 17/35/6701    Family History  Problem Relation Age of Onset   Hypertension Mother    Heart Problems Mother    Cancer Father        lung and prostate ca   Diabetes Other    Breast cancer Cousin        2-twins   Colon cancer Neg Hx     Past Surgical History:  Procedure Laterality Date   LIPOMA EXCISION Left 07/19/2019   Procedure: EXCISION OF SUBCUTANEOUS LIPOMA LEFT  BUTTOCK;  Surgeon: Donnie Mesa, MD;  Location: [facility];  Service: General;  Laterality: Left;   MYOMECTOMY     fibroids   SHOULDER ARTHROSCOPY WITH OPEN ROTATOR CUFF REPAIR AND DISTAL CLAVICLE ACROMINECTOMY Right 08/13/2022   Procedure: right shoulder arthroscopy, debridement, biceps tenodesis, mini open rotator cuff tear repair;  Surgeon: Meredith Pel, MD;  Location: [facility];  Service: Orthopedics;  Laterality: Right;   Social History   Occupational History   Not on file  Tobacco Use   Smoking status: Never   Smokeless tobacco: Never  Vaping Use   Vaping Use: Never used  Substance and Sexual Activity   Alcohol use: Yes    Alcohol/week: 0.0 standard drinks of alcohol    Comment: rarely   Drug use: No   Sexual activity: Not on file

## 2023-01-21 ENCOUNTER — Ambulatory Visit (INDEPENDENT_AMBULATORY_CARE_PROVIDER_SITE_OTHER): Payer: BC Managed Care – PPO | Admitting: Internal Medicine

## 2023-01-21 ENCOUNTER — Encounter: Payer: Self-pay | Admitting: Internal Medicine

## 2023-01-21 VITALS — BP 111/65 | HR 76 | Temp 98.1°F | Wt 185.4 lb

## 2023-01-21 DIAGNOSIS — E1169 Type 2 diabetes mellitus with other specified complication: Secondary | ICD-10-CM | POA: Diagnosis not present

## 2023-01-21 DIAGNOSIS — I10 Essential (primary) hypertension: Secondary | ICD-10-CM

## 2023-01-21 NOTE — Progress Notes (Signed)
Established Patient Office Visit     CC/Reason for Visit: Follow-up blood pressure  HPI: Katie Morse is a 57 y.o. female who is coming in today for the above mentioned reasons.  On January 11 she was seen and noted to have a blood pressure of 170/100.  She is taking hydrochlorothiazide 25 mg, losartan 100 mg, amlodipine 5 mg as well as metoprolol 25 mg twice daily.  She is adherent to medical therapy.  She has been doing ambulatory home measurements with systolics in the 1 teens to 025K and diastolics in the 27C to 62B.   Past Medical/Surgical History: Past Medical History:  Diagnosis Date   ALLERGIC RHINITIS 08/20/2008   Allergy    Anemia    Arthritis    Blood transfusion without reported diagnosis 2011   post surgery   Diabetes mellitus without complication ([place])    DYSPNEA 11/12/2008   Dysrhythmia    a. fib   Headache(784.0) 08/20/2008   HYPERTENSION 08/20/2008   LEIOMYOMA, UTERUS 08/20/2008   PALPITATIONS, RECURRENT 11/12/2008   Sickle-cell trait ([place]) 76/28/3151   SYSTOLIC MURMUR 76/16/0737    Past Surgical History:  Procedure Laterality Date   LIPOMA EXCISION Left 07/19/2019   Procedure: EXCISION OF SUBCUTANEOUS LIPOMA LEFT BUTTOCK;  Surgeon: Donnie Mesa, MD;  Location: [facility];  Service: General;  Laterality: Left;   MYOMECTOMY     fibroids   SHOULDER ARTHROSCOPY WITH OPEN ROTATOR CUFF REPAIR AND DISTAL CLAVICLE ACROMINECTOMY Right 08/13/2022   Procedure: right shoulder arthroscopy, debridement, biceps tenodesis, mini open rotator cuff tear repair;  Surgeon: Meredith Pel, MD;  Location: [facility];  Service: Orthopedics;  Laterality: Right;    Social History:  reports that she has never smoked. She has never used smokeless tobacco. She reports current alcohol use. She reports that she does not use drugs.  Allergies: Allergies  Allergen Reactions   Latex Rash   Wild Lettuce Extract (Lactuca Virosa) Other (See Comments)     Gi -upset Other reaction(s): Other Gi -upset   Avocado Nausea Only   Lactose Intolerance (Gi)     Gi-upset   Metformin Hcl Diarrhea   Other Other (See Comments)    Harriet Masson /Gi upset   Watermelon Flavor     Gi upset    Family History: Family History  Problem Relation Age of Onset   Hypertension Mother    Heart Problems Mother    Cancer Father        lung and prostate ca   Diabetes Other    Breast cancer Cousin        2-twins   Colon cancer Neg Hx      Current Outpatient Medications:    acetaminophen (TYLENOL) 650 MG CR tablet, Take 650-1,300 mg by mouth every 8 (eight) hours as needed for pain., Disp: , Rfl:    albuterol (VENTOLIN HFA) 108 (90 Base) MCG/ACT inhaler, INHALE 1 PUFF INTO THE LUNGS EVERY 6 HOURS AS NEEDED FOR WHEEZING OR SHORTNESS OF BREATH., Disp: 6.7 each, Rfl: 3   amLODipine (NORVASC) 5 MG tablet, Take 1 tablet (5 mg total) by mouth daily., Disp: 90 tablet, Rfl: 1   ASPIRIN LOW DOSE 81 MG EC tablet, TAKE 1 TABLET BY MOUTH EVERY DAY, Disp: 30 tablet, Rfl: 11   Aspirin-Caffeine (BAYER BACK & BODY) 500-32.5 MG TABS, Take 2 tablets by mouth at bedtime as needed (Pain)., Disp: , Rfl:    atorvastatin (LIPITOR) 80 MG tablet, Take 1 tablet (80 mg  total) by mouth at bedtime., Disp: 90 tablet, Rfl: 1   Cranberry 250 MG CAPS, Orally, Disp: , Rfl:    Cyanocobalamin (VITAMIN B-12 IJ), Inject 1 mL as directed every 30 (thirty) days., Disp: , Rfl:    cyclobenzaprine (FLEXERIL) 5 MG tablet, TAKE 1 TABLET BY MOUTH AT BEDTIME AS NEEDED FOR MUSCLE SPASMS., Disp: 30 tablet, Rfl: 1   diclofenac (VOLTAREN) 75 MG EC tablet, TAKE 1 TABLET BY MOUTH TWICE A DAY, Disp: 60 tablet, Rfl: 0   diclofenac Sodium (VOLTAREN) 1 % GEL, Apply 2 g topically daily as needed (arm pain)., Disp: , Rfl:    ezetimibe (ZETIA) 10 MG tablet, Take 1 tablet (10 mg total) by mouth daily., Disp: 90 tablet, Rfl: 1   fluticasone (FLONASE) 50 MCG/ACT nasal spray, Place 2 sprays into both nostrils daily as  needed for allergies., Disp: , Rfl:    fluticasone (FLONASE) 50 MCG/ACT nasal spray, Place into the nose., Disp: , Rfl:    hydrochlorothiazide (HYDRODIURIL) 25 MG tablet, TAKE 1 TABLET (25 MG TOTAL) BY MOUTH DAILY., Disp: 90 tablet, Rfl: 1   HYDROcodone-acetaminophen (NORCO) 5-325 MG tablet, Take 1 tablet by mouth every 6 (six) hours as needed for moderate pain., Disp: 35 tablet, Rfl: 0   hydrocortisone 2.5 % cream, Apply topically 2 (two) times daily., Disp: 30 g, Rfl: 0   ketorolac (ACULAR) 0.5 % ophthalmic solution, SMARTSIG:In Eye(s), Disp: , Rfl:    losartan (COZAAR) 100 MG tablet, Take 1 tablet (100 mg total) by mouth daily., Disp: 90 tablet, Rfl: 1   metoprolol tartrate (LOPRESSOR) 25 MG tablet, TAKE 1 TABLET BY MOUTH TWICE A DAY, Disp: 180 tablet, Rfl: 0   moxifloxacin (VIGAMOX) 0.5 % ophthalmic solution, Apply to eye., Disp: , Rfl:    Multiple Vitamins-Minerals (HAIR/SKIN/NAILS/BIOTIN PO), Orally, Disp: , Rfl:    nitroGLYCERIN (NITROSTAT) 0.4 MG SL tablet, Place 0.4 mg under the tongue as needed for chest pain., Disp: , Rfl:    Omega-3 Fatty Acids (FISH OIL) 500 MG CAPS, 1 capsule Orally Twice a day, Disp: , Rfl:    Polyvinyl Alcohol-Povidone PF (REFRESH) 1.4-0.6 % SOLN, Place 1 drop into both eyes in the morning, at noon, in the evening, and at bedtime., Disp: , Rfl:    POTASSIUM PO, Potassium, Disp: , Rfl:    prednisoLONE acetate (PRED FORTE) 1 % ophthalmic suspension, one drop 4 (four) times daily., Disp: , Rfl:    Prenatal Vit-Fe Fumarate-FA (PRENATAL MULTIVITAMIN) TABS tablet, Take 1 tablet by mouth daily at 12 noon., Disp: , Rfl:    tirzepatide (MOUNJARO) 2.5 MG/0.5ML Pen, Inject 2.5 mg into the skin once a week., Disp: , Rfl:    trimethoprim-polymyxin b (POLYTRIM) ophthalmic solution, Place 2 drops into both eyes every 4 (four) hours., Disp: 10 mL, Rfl: 0   vitamin C (ASCORBIC ACID) 500 MG tablet, Take 500 mg by mouth daily., Disp: , Rfl:    vitamin E 200 UNIT capsule, one capsule  (200 Units dose)., Disp: , Rfl:   Current Facility-Administered Medications:    cyanocobalamin (VITAMIN B12) injection 1,000 mcg, 1,000 mcg, Intramuscular, Q30 days, Isaac Bliss, Rayford Halsted, MD, 1,000 mcg at 11/26/22 1410  Review of Systems:  Negative unless indicated in HPI.   Physical Exam: Vitals:   01/21/23 1121  BP: 111/65  Pulse: 76  Temp: 98.1 F (36.7 C)  TempSrc: Oral  SpO2: 96%  Weight: 185 lb 6.4 oz (84.1 kg)    Body mass index is 33.91 kg/m.   Physical  Exam Vitals reviewed.  Constitutional:      Appearance: Normal appearance.  HENT:     Head: Normocephalic and atraumatic.  Eyes:     Conjunctiva/sclera: Conjunctivae normal.     Pupils: Pupils are equal, round, and reactive to light.  Cardiovascular:     Rate and Rhythm: Normal rate and regular rhythm.  Pulmonary:     Effort: Pulmonary effort is normal.     Breath sounds: Normal breath sounds.  Skin:    General: Skin is warm and dry.  Neurological:     General: No focal deficit present.     Mental Status: She is alert and oriented to person, place, and time.  Psychiatric:        Mood and Affect: Mood normal.        Behavior: Behavior normal.        Thought Content: Thought content normal.        Judgment: Judgment normal.      Impression and Plan:  Type 2 diabetes mellitus with other specified complication, without long-term current use of insulin (HCC)  Essential hypertension  -Excellent blood pressure control, she is adherent to all 4 medications. -She is tolerating Mounjaro well, she has taken her second dose.  Follow-up A1c in 3 months.  Time spent:22 minutes reviewing chart, interviewing and examining patient and formulating plan of care.     Lelon Frohlich, MD [facility]

## 2023-01-26 DIAGNOSIS — M25511 Pain in right shoulder: Secondary | ICD-10-CM | POA: Diagnosis not present

## 2023-01-26 DIAGNOSIS — M25611 Stiffness of right shoulder, not elsewhere classified: Secondary | ICD-10-CM | POA: Diagnosis not present

## 2023-01-26 DIAGNOSIS — M6281 Muscle weakness (generalized): Secondary | ICD-10-CM | POA: Diagnosis not present

## 2023-01-28 DIAGNOSIS — M6281 Muscle weakness (generalized): Secondary | ICD-10-CM | POA: Diagnosis not present

## 2023-01-28 DIAGNOSIS — M25611 Stiffness of right shoulder, not elsewhere classified: Secondary | ICD-10-CM | POA: Diagnosis not present

## 2023-01-28 DIAGNOSIS — M25511 Pain in right shoulder: Secondary | ICD-10-CM | POA: Diagnosis not present

## 2023-01-29 ENCOUNTER — Other Ambulatory Visit: Payer: Self-pay | Admitting: Internal Medicine

## 2023-01-29 DIAGNOSIS — G8929 Other chronic pain: Secondary | ICD-10-CM

## 2023-01-29 DIAGNOSIS — I10 Essential (primary) hypertension: Secondary | ICD-10-CM

## 2023-02-02 DIAGNOSIS — M25511 Pain in right shoulder: Secondary | ICD-10-CM | POA: Diagnosis not present

## 2023-02-02 DIAGNOSIS — M6281 Muscle weakness (generalized): Secondary | ICD-10-CM | POA: Diagnosis not present

## 2023-02-02 DIAGNOSIS — M25611 Stiffness of right shoulder, not elsewhere classified: Secondary | ICD-10-CM | POA: Diagnosis not present

## 2023-02-11 DIAGNOSIS — M25511 Pain in right shoulder: Secondary | ICD-10-CM | POA: Diagnosis not present

## 2023-02-11 DIAGNOSIS — M6281 Muscle weakness (generalized): Secondary | ICD-10-CM | POA: Diagnosis not present

## 2023-02-11 DIAGNOSIS — M25611 Stiffness of right shoulder, not elsewhere classified: Secondary | ICD-10-CM | POA: Diagnosis not present

## 2023-02-14 ENCOUNTER — Other Ambulatory Visit: Payer: Self-pay | Admitting: Internal Medicine

## 2023-02-17 DIAGNOSIS — M6281 Muscle weakness (generalized): Secondary | ICD-10-CM | POA: Diagnosis not present

## 2023-02-17 DIAGNOSIS — M25511 Pain in right shoulder: Secondary | ICD-10-CM | POA: Diagnosis not present

## 2023-02-17 DIAGNOSIS — M25611 Stiffness of right shoulder, not elsewhere classified: Secondary | ICD-10-CM | POA: Diagnosis not present

## 2023-02-18 DIAGNOSIS — M25511 Pain in right shoulder: Secondary | ICD-10-CM | POA: Diagnosis not present

## 2023-02-18 DIAGNOSIS — M6281 Muscle weakness (generalized): Secondary | ICD-10-CM | POA: Diagnosis not present

## 2023-02-18 DIAGNOSIS — M25611 Stiffness of right shoulder, not elsewhere classified: Secondary | ICD-10-CM | POA: Diagnosis not present

## 2023-02-22 DIAGNOSIS — M25611 Stiffness of right shoulder, not elsewhere classified: Secondary | ICD-10-CM | POA: Diagnosis not present

## 2023-02-22 DIAGNOSIS — M6281 Muscle weakness (generalized): Secondary | ICD-10-CM | POA: Diagnosis not present

## 2023-02-22 DIAGNOSIS — M25511 Pain in right shoulder: Secondary | ICD-10-CM | POA: Diagnosis not present

## 2023-02-24 DIAGNOSIS — M25611 Stiffness of right shoulder, not elsewhere classified: Secondary | ICD-10-CM | POA: Diagnosis not present

## 2023-02-24 DIAGNOSIS — M6281 Muscle weakness (generalized): Secondary | ICD-10-CM | POA: Diagnosis not present

## 2023-02-24 DIAGNOSIS — M25511 Pain in right shoulder: Secondary | ICD-10-CM | POA: Diagnosis not present

## 2023-03-01 DIAGNOSIS — M6281 Muscle weakness (generalized): Secondary | ICD-10-CM | POA: Diagnosis not present

## 2023-03-01 DIAGNOSIS — M25611 Stiffness of right shoulder, not elsewhere classified: Secondary | ICD-10-CM | POA: Diagnosis not present

## 2023-03-01 DIAGNOSIS — M25511 Pain in right shoulder: Secondary | ICD-10-CM | POA: Diagnosis not present

## 2023-03-03 DIAGNOSIS — M6281 Muscle weakness (generalized): Secondary | ICD-10-CM | POA: Diagnosis not present

## 2023-03-03 DIAGNOSIS — M25611 Stiffness of right shoulder, not elsewhere classified: Secondary | ICD-10-CM | POA: Diagnosis not present

## 2023-03-03 DIAGNOSIS — M25511 Pain in right shoulder: Secondary | ICD-10-CM | POA: Diagnosis not present

## 2023-03-04 ENCOUNTER — Ambulatory Visit (INDEPENDENT_AMBULATORY_CARE_PROVIDER_SITE_OTHER): Payer: BC Managed Care – PPO

## 2023-03-04 ENCOUNTER — Telehealth: Payer: Self-pay | Admitting: Internal Medicine

## 2023-03-04 DIAGNOSIS — E538 Deficiency of other specified B group vitamins: Secondary | ICD-10-CM | POA: Diagnosis not present

## 2023-03-04 MED ORDER — CYANOCOBALAMIN 1000 MCG/ML IJ SOLN
1000.0000 ug | Freq: Once | INTRAMUSCULAR | Status: AC
  Administered 2023-03-04: 1000 ug via INTRAMUSCULAR

## 2023-03-04 NOTE — Progress Notes (Signed)
Pt here for monthly B12 injection per Dr Jerilee Hoh  B12 1057mg given IM, and pt tolerated injection well.  Next B12 injection scheduled for 04/05/23

## 2023-03-04 NOTE — Telephone Encounter (Signed)
Pt can not afford diclofenac (VOLTAREN) 75 MG EC tablet the cost is 29.00 and pt would like something else sent [facility] pharm  [id number] [place] [phone] Fax: [phone]

## 2023-03-08 NOTE — Telephone Encounter (Signed)
Left message on machine for patient to return our call 

## 2023-03-08 NOTE — Telephone Encounter (Signed)
Patient is aware 

## 2023-03-24 ENCOUNTER — Other Ambulatory Visit: Payer: Self-pay | Admitting: Internal Medicine

## 2023-04-02 ENCOUNTER — Other Ambulatory Visit: Payer: Self-pay | Admitting: Internal Medicine

## 2023-04-02 DIAGNOSIS — E1169 Type 2 diabetes mellitus with other specified complication: Secondary | ICD-10-CM

## 2023-04-05 ENCOUNTER — Ambulatory Visit (INDEPENDENT_AMBULATORY_CARE_PROVIDER_SITE_OTHER): Payer: BC Managed Care – PPO

## 2023-04-05 DIAGNOSIS — E538 Deficiency of other specified B group vitamins: Secondary | ICD-10-CM | POA: Diagnosis not present

## 2023-04-05 MED ORDER — CYANOCOBALAMIN 1000 MCG/ML IJ SOLN
1000.0000 ug | Freq: Once | INTRAMUSCULAR | Status: AC
Start: 2023-04-05 — End: 2023-04-05
  Administered 2023-04-05: 1000 ug via INTRAMUSCULAR

## 2023-04-05 NOTE — Progress Notes (Signed)
Per orders of Dr. Ardyth Harps, injection of Cyanocobalamin Inj. 1000 mcg/mL  given by Vickii Chafe on Right Deltoid.  Patient tolerated injection well.   Pt is scheduled for next injection on 05/05/2023.

## 2023-04-18 ENCOUNTER — Other Ambulatory Visit: Payer: Self-pay | Admitting: Internal Medicine

## 2023-04-21 ENCOUNTER — Ambulatory Visit: Payer: BC Managed Care – PPO | Admitting: Internal Medicine

## 2023-04-21 DIAGNOSIS — E1169 Type 2 diabetes mellitus with other specified complication: Secondary | ICD-10-CM

## 2023-04-28 ENCOUNTER — Other Ambulatory Visit: Payer: Self-pay | Admitting: Internal Medicine

## 2023-04-28 DIAGNOSIS — I10 Essential (primary) hypertension: Secondary | ICD-10-CM

## 2023-04-29 ENCOUNTER — Ambulatory Visit: Payer: BC Managed Care – PPO | Admitting: Internal Medicine

## 2023-04-29 ENCOUNTER — Encounter: Payer: Self-pay | Admitting: Internal Medicine

## 2023-04-29 ENCOUNTER — Ambulatory Visit (INDEPENDENT_AMBULATORY_CARE_PROVIDER_SITE_OTHER): Payer: BC Managed Care – PPO | Admitting: Internal Medicine

## 2023-04-29 ENCOUNTER — Other Ambulatory Visit: Payer: Self-pay | Admitting: Internal Medicine

## 2023-04-29 VITALS — BP 126/82 | HR 81 | Temp 97.9°F | Wt 194.0 lb

## 2023-04-29 DIAGNOSIS — E538 Deficiency of other specified B group vitamins: Secondary | ICD-10-CM | POA: Diagnosis not present

## 2023-04-29 DIAGNOSIS — Z7985 Long-term (current) use of injectable non-insulin antidiabetic drugs: Secondary | ICD-10-CM | POA: Diagnosis not present

## 2023-04-29 DIAGNOSIS — E785 Hyperlipidemia, unspecified: Secondary | ICD-10-CM

## 2023-04-29 DIAGNOSIS — E559 Vitamin D deficiency, unspecified: Secondary | ICD-10-CM

## 2023-04-29 DIAGNOSIS — E1169 Type 2 diabetes mellitus with other specified complication: Secondary | ICD-10-CM

## 2023-04-29 DIAGNOSIS — Z23 Encounter for immunization: Secondary | ICD-10-CM

## 2023-04-29 DIAGNOSIS — I10 Essential (primary) hypertension: Secondary | ICD-10-CM | POA: Diagnosis not present

## 2023-04-29 DIAGNOSIS — Z6835 Body mass index (BMI) 35.0-35.9, adult: Secondary | ICD-10-CM

## 2023-04-29 LAB — VITAMIN D 25 HYDROXY (VIT D DEFICIENCY, FRACTURES): VITD: 25.98 ng/mL — ABNORMAL LOW (ref 30.00–100.00)

## 2023-04-29 LAB — LIPID PANEL
Cholesterol: 123 mg/dL (ref 0–200)
HDL: 49 mg/dL (ref >39.00)
LDL Cholesterol: 52 mg/dL (ref 0–99)
NonHDL: 74.41
Total CHOL/HDL Ratio: 3
Triglycerides: 110 mg/dL (ref 0.0–149.0)
VLDL: 22 mg/dL (ref 0.0–40.0)

## 2023-04-29 LAB — POCT GLYCOSYLATED HEMOGLOBIN (HGB A1C): Hemoglobin A1C: 7.7 % — AB (ref 4.0–5.6)

## 2023-04-29 MED ORDER — VITAMIN D (ERGOCALCIFEROL) 1.25 MG (50000 UNIT) PO CAPS
50000.0000 [IU] | ORAL_CAPSULE | ORAL | 0 refills | Status: AC
Start: 2023-04-29 — End: 2023-07-16

## 2023-04-29 NOTE — Assessment & Plan Note (Signed)
On monthly IM B12

## 2023-04-29 NOTE — Progress Notes (Signed)
Established Patient Office Visit     CC/Reason for Visit: 12-month follow-up chronic medical conditions  HPI: Katie Morse is a 57 y.o. female who is coming in today for the above mentioned reasons. Past Medical History is significant for: Hypertension, hyperlipidemia, type 2 diabetes, obesity, vitamin B12 deficiency.  She is feeling well and has no acute concerns or complaints.  She has been on Mounjaro 2.5 mg now for 3 months and is tolerating well.  She is due to recheck vitamin D and cholesterol levels.   Past Medical/Surgical History: Past Medical History:  Diagnosis Date   ALLERGIC RHINITIS 08/20/2008   Allergy    Anemia    Arthritis    Blood transfusion without reported diagnosis 2011   post surgery   Diabetes mellitus without complication (HCC)    DYSPNEA 11/12/2008   Dysrhythmia    a. fib   Headache(784.0) 08/20/2008   HYPERTENSION 08/20/2008   LEIOMYOMA, UTERUS 08/20/2008   PALPITATIONS, RECURRENT 11/12/2008   Sickle-cell trait (HCC) 08/20/2008   SYSTOLIC MURMUR 11/12/2008    Past Surgical History:  Procedure Laterality Date   LIPOMA EXCISION Left 07/19/2019   Procedure: EXCISION OF SUBCUTANEOUS LIPOMA LEFT BUTTOCK;  Surgeon: Manus Rudd, MD;  Location: [facility];  Service: General;  Laterality: Left;   MYOMECTOMY     fibroids   SHOULDER ARTHROSCOPY WITH OPEN ROTATOR CUFF REPAIR AND DISTAL CLAVICLE ACROMINECTOMY Right 08/13/2022   Procedure: right shoulder arthroscopy, debridement, biceps tenodesis, mini open rotator cuff tear repair;  Surgeon: Cammy Copa, MD;  [facility];  Service: Orthopedics;  Laterality: Right;    Social History:  reports that she has never smoked. She has never used smokeless tobacco. She reports current alcohol use. She reports that she does not use drugs.  Allergies: Allergies  Allergen Reactions   Latex Rash   Wild Lettuce Extract (Lactuca Virosa) Other (See Comments)    Gi  -upset Other reaction(s): Other Gi -upset   Avocado Nausea Only   Lactose Intolerance (Gi)     Gi-upset   Metformin Hcl Diarrhea   Other Other (See Comments)    [facility] /Gi upset   Watermelon Flavor     Gi upset    Family History:  Family History  Problem Relation Age of Onset   Hypertension Mother    Heart Problems Mother    Cancer Father        lung and prostate ca   Diabetes Other    Breast cancer Cousin        2-twins   Colon cancer Neg Hx      Current Outpatient Medications:    acetaminophen (TYLENOL) 650 MG CR tablet, Take 650-1,300 mg by mouth every 8 (eight) hours as needed for pain., Disp: , Rfl:    albuterol (VENTOLIN HFA) 108 (90 Base) MCG/ACT inhaler, INHALE 1 PUFF INTO THE LUNGS EVERY 6 HOURS AS NEEDED FOR WHEEZING OR SHORTNESS OF BREATH., Disp: 6.7 each, Rfl: 3   amLODipine (NORVASC) 5 MG tablet, Take 1 tablet (5 mg total) by mouth daily., Disp: 90 tablet, Rfl: 1   ASPIRIN LOW DOSE 81 MG EC tablet, TAKE 1 TABLET BY MOUTH EVERY DAY, Disp: 30 tablet, Rfl: 11   Aspirin-Caffeine (BAYER BACK & BODY) 500-32.5 MG TABS, Take 2 tablets by mouth at bedtime as needed (Pain)., Disp: , Rfl:    atorvastatin (LIPITOR) 80 MG tablet, Take 1 tablet (80 mg total) by mouth at bedtime., Disp: 90 tablet, Rfl: 1  Cranberry 250 MG CAPS, Orally, Disp: , Rfl:    Cyanocobalamin (VITAMIN B-12 IJ), Inject 1 mL as directed every 30 (thirty) days., Disp: , Rfl:    cyclobenzaprine (FLEXERIL) 5 MG tablet, TAKE 1 TABLET BY MOUTH AT BEDTIME AS NEEDED FOR MUSCLE SPASMS., Disp: 30 tablet, Rfl: 1   diclofenac (VOLTAREN) 75 MG EC tablet, TAKE 1 TABLET BY MOUTH TWICE A DAY, Disp: 60 tablet, Rfl: 0   diclofenac Sodium (VOLTAREN) 1 % GEL, Apply 2 g topically daily as needed (arm pain)., Disp: , Rfl:    ezetimibe (ZETIA) 10 MG tablet, TAKE 1 TABLET BY MOUTH EVERY DAY, Disp: 90 tablet, Rfl: 1   fluticasone (FLONASE) 50 MCG/ACT nasal spray, Place 2 sprays into both nostrils daily as needed for  allergies., Disp: , Rfl:    fluticasone (FLONASE) 50 MCG/ACT nasal spray, Place into the nose., Disp: , Rfl:    hydrochlorothiazide (HYDRODIURIL) 25 MG tablet, TAKE 1 TABLET (25 MG TOTAL) BY MOUTH DAILY., Disp: 90 tablet, Rfl: 1   HYDROcodone-acetaminophen (NORCO) 5-325 MG tablet, Take 1 tablet by mouth every 6 (six) hours as needed for moderate pain., Disp: 35 tablet, Rfl: 0   hydrocortisone 2.5 % cream, Apply topically 2 (two) times daily., Disp: 30 g, Rfl: 0   ketorolac (ACULAR) 0.5 % ophthalmic solution, SMARTSIG:In Eye(s), Disp: , Rfl:    losartan (COZAAR) 100 MG tablet, TAKE 1 TABLET BY MOUTH EVERY DAY, Disp: 90 tablet, Rfl: 1   metoprolol tartrate (LOPRESSOR) 25 MG tablet, TAKE 1 TABLET BY MOUTH TWICE A DAY, Disp: 180 tablet, Rfl: 1   moxifloxacin (VIGAMOX) 0.5 % ophthalmic solution, Apply to eye., Disp: , Rfl:    Multiple Vitamins-Minerals (HAIR/SKIN/NAILS/BIOTIN PO), Orally, Disp: , Rfl:    nitroGLYCERIN (NITROSTAT) 0.4 MG SL tablet, PLACE 1 TABLET UNDER THE TONGUE EVERY 5 MINUTES AS NEEDED FOR CHEST PAIN., Disp: 25 tablet, Rfl: 2   Omega-3 Fatty Acids (FISH OIL) 500 MG CAPS, 1 capsule Orally Twice a day, Disp: , Rfl:    Polyvinyl Alcohol-Povidone PF (REFRESH) 1.4-0.6 % SOLN, Place 1 drop into both eyes in the morning, at noon, in the evening, and at bedtime., Disp: , Rfl:    POTASSIUM PO, Potassium, Disp: , Rfl:    prednisoLONE acetate (PRED FORTE) 1 % ophthalmic suspension, one drop 4 (four) times daily., Disp: , Rfl:    Prenatal Vit-Fe Fumarate-FA (PRENATAL MULTIVITAMIN) TABS tablet, Take 1 tablet by mouth daily at 12 noon., Disp: , Rfl:    tirzepatide (MOUNJARO) 2.5 MG/0.5ML Pen, INJECT 2.5 MG SUBCUTANEOUSLY WEEKLY, Disp: 6 mL, Rfl: 0   trimethoprim-polymyxin b (POLYTRIM) ophthalmic solution, Place 2 drops into both eyes every 4 (four) hours., Disp: 10 mL, Rfl: 0   vitamin C (ASCORBIC ACID) 500 MG tablet, Take 500 mg by mouth daily., Disp: , Rfl:    vitamin E 200 UNIT capsule, one  capsule (200 Units dose)., Disp: , Rfl:   Review of Systems:  Negative unless indicated in HPI.   Physical Exam: Vitals:   04/29/23 1021  BP: 126/82  Pulse: 81  Temp: 97.9 F (36.6 C)  TempSrc: Oral  SpO2: 99%  Weight: 194 lb (88 kg)    Body mass index is 35.48 kg/m.   Physical Exam Vitals reviewed.  Constitutional:      Appearance: Normal appearance.  HENT:     Head: Normocephalic and atraumatic.  Eyes:     Conjunctiva/sclera: Conjunctivae normal.     Pupils: Pupils are equal, round, and reactive to light.  Cardiovascular:     Rate and Rhythm: Normal rate and regular rhythm.  Pulmonary:     Effort: Pulmonary effort is normal.     Breath sounds: Normal breath sounds.  Skin:    General: Skin is warm and dry.  Neurological:     General: No focal deficit present.     Mental Status: She is alert and oriented to person, place, and time.  Psychiatric:        Mood and Affect: Mood normal.        Behavior: Behavior normal.        Thought Content: Thought content normal.        Judgment: Judgment normal.      Impression and Plan:  Type 2 diabetes mellitus with other specified complication, without long-term current use of insulin (HCC) Assessment & Plan: A1c with significant improvement from 12.4 down to 7.7. Now on Mounjaro 2.5 mg. Keep on same dose due to supply chain issues but consider escalating once availability improves.  Orders: -     POCT glycosylated hemoglobin (Hb A1C)  Morbid obesity (HCC) Assessment & Plan: -Discussed healthy lifestyle, including increased physical activity and better food choices to promote weight loss.    Vitamin B12 deficiency Assessment & Plan: On monthly IM B12.   Vitamin D deficiency Assessment & Plan: Check levels today.  Orders: -     VITAMIN D 25 Hydroxy (Vit-D Deficiency, Fractures); Future  Hyperlipidemia associated with type 2 diabetes mellitus (HCC) Assessment & Plan: On max dose atorvastatin and  ezetimibe. Recheck lipids. If still above goal consider referral to lipid clinic.  Orders: -     Lipid panel; Future  Essential hypertension Assessment & Plan: Well controlled on amlodipine, losartan, HCTZ, metoprolol.      Time spent:32 minutes reviewing chart, interviewing and examining patient and formulating plan of care.     Chaya Jan, MD [facility]

## 2023-04-29 NOTE — Assessment & Plan Note (Signed)
On max dose atorvastatin and ezetimibe. Recheck lipids. If still above goal consider referral to lipid clinic.

## 2023-04-29 NOTE — Assessment & Plan Note (Signed)
Check levels today.  

## 2023-04-29 NOTE — Assessment & Plan Note (Signed)
Well controlled on amlodipine, losartan, HCTZ, metoprolol.

## 2023-04-29 NOTE — Assessment & Plan Note (Signed)
A1c with significant improvement from 12.4 down to 7.7. Now on Mounjaro 2.5 mg. Keep on same dose due to supply chain issues but consider escalating once availability improves.

## 2023-04-29 NOTE — Assessment & Plan Note (Signed)
Discussed healthy lifestyle, including increased physical activity and better food choices to promote weight loss.  

## 2023-05-05 ENCOUNTER — Ambulatory Visit: Payer: BC Managed Care – PPO

## 2023-05-05 ENCOUNTER — Ambulatory Visit (INDEPENDENT_AMBULATORY_CARE_PROVIDER_SITE_OTHER): Payer: BC Managed Care – PPO

## 2023-05-05 DIAGNOSIS — E538 Deficiency of other specified B group vitamins: Secondary | ICD-10-CM | POA: Diagnosis not present

## 2023-05-05 MED ORDER — CYANOCOBALAMIN 1000 MCG/ML IJ SOLN
1000.0000 ug | Freq: Once | INTRAMUSCULAR | Status: AC
Start: 2023-05-05 — End: 2023-05-05
  Administered 2023-05-05: 1000 ug via INTRAMUSCULAR

## 2023-05-05 NOTE — Progress Notes (Signed)
Per orders of Dr. Hernandez, injection of Cyanocobalamin 1000 mcg given by [place] Shannahan L Jolleen Seman. Patient tolerated injection well.  

## 2023-05-14 ENCOUNTER — Other Ambulatory Visit: Payer: Self-pay | Admitting: Internal Medicine

## 2023-05-14 DIAGNOSIS — G8929 Other chronic pain: Secondary | ICD-10-CM

## 2023-05-24 ENCOUNTER — Other Ambulatory Visit: Payer: Self-pay | Admitting: Internal Medicine

## 2023-05-24 DIAGNOSIS — I10 Essential (primary) hypertension: Secondary | ICD-10-CM

## 2023-05-26 ENCOUNTER — Telehealth: Payer: Self-pay | Admitting: Internal Medicine

## 2023-05-26 MED ORDER — ATORVASTATIN CALCIUM 80 MG PO TABS: 80.0000 mg | ORAL_TABLET | Freq: Every day | ORAL | 1 refills | Status: DC

## 2023-05-26 NOTE — Telephone Encounter (Signed)
Prescription Request  05/26/2023  LOV: 04/29/2023  What is the name of the medication or equipment? atorvastatin (LIPITOR) 80 MG tablet  Have you contacted your pharmacy to request a refill? Yes   Which pharmacy would you like this sent to?  [phone] [place] [place] - [address] [place] [address] Phone: [phone] Fax: [phone]    Patient notified that their request is being sent to the clinical staff for review and that they should receive a response within 2 business days.   Please advise at Mobile [phone] (mobile)

## 2023-05-26 NOTE — Telephone Encounter (Signed)
Refill sent.

## 2023-05-28 DIAGNOSIS — H10231 Serous conjunctivitis, except viral, right eye: Secondary | ICD-10-CM | POA: Diagnosis not present

## 2023-06-04 ENCOUNTER — Ambulatory Visit (INDEPENDENT_AMBULATORY_CARE_PROVIDER_SITE_OTHER): Payer: BC Managed Care – PPO

## 2023-06-04 DIAGNOSIS — E538 Deficiency of other specified B group vitamins: Secondary | ICD-10-CM

## 2023-06-04 MED ORDER — CYANOCOBALAMIN 1000 MCG/ML IJ SOLN
1000.0000 ug | Freq: Once | INTRAMUSCULAR | Status: AC
Start: 2023-06-04 — End: 2023-06-04
  Administered 2023-06-04: 1000 ug via INTRAMUSCULAR

## 2023-06-04 NOTE — Progress Notes (Signed)
Per orders of Dr. Caryl Never, injection of Cyanocobalamin Inj. 1000 mcg given by Vickii Chafe Left Deltoid. Patient tolerated injection well.

## 2023-07-01 ENCOUNTER — Other Ambulatory Visit: Payer: Self-pay | Admitting: Internal Medicine

## 2023-07-01 DIAGNOSIS — I10 Essential (primary) hypertension: Secondary | ICD-10-CM

## 2023-07-02 ENCOUNTER — Ambulatory Visit (INDEPENDENT_AMBULATORY_CARE_PROVIDER_SITE_OTHER): Payer: BC Managed Care – PPO

## 2023-07-02 DIAGNOSIS — E538 Deficiency of other specified B group vitamins: Secondary | ICD-10-CM

## 2023-07-02 MED ORDER — CYANOCOBALAMIN 1000 MCG/ML IJ SOLN
1000.0000 ug | Freq: Once | INTRAMUSCULAR | Status: AC
Start: 2023-07-02 — End: 2023-07-02
  Administered 2023-07-02: 1000 ug via INTRAMUSCULAR

## 2023-07-02 NOTE — Progress Notes (Signed)
Pt here for monthly B12 injection per Dr. Caryl Never.  B12 given IM and pt tolerated injection well.

## 2023-07-06 ENCOUNTER — Other Ambulatory Visit: Payer: Self-pay | Admitting: Internal Medicine

## 2023-07-06 DIAGNOSIS — I10 Essential (primary) hypertension: Secondary | ICD-10-CM

## 2023-07-07 NOTE — Telephone Encounter (Signed)
Pt called to say she is completely out of this medication and has been for a few days now.  Please advise.

## 2023-07-16 NOTE — Telephone Encounter (Signed)
Pt called to follow up on this refill request. Pt was informed MD [facility] tablets [facility] [address] [facility] [place] on 07/06/23

## 2023-07-20 NOTE — Telephone Encounter (Signed)
Pt is calling and I see the hydrochlorothiazide transmission failed please resend

## 2023-07-21 ENCOUNTER — Encounter (INDEPENDENT_AMBULATORY_CARE_PROVIDER_SITE_OTHER): Payer: Self-pay

## 2023-08-05 ENCOUNTER — Encounter (INDEPENDENT_AMBULATORY_CARE_PROVIDER_SITE_OTHER): Payer: Self-pay

## 2023-08-05 ENCOUNTER — Ambulatory Visit: Payer: BC Managed Care – PPO

## 2023-08-05 DIAGNOSIS — E538 Deficiency of other specified B group vitamins: Secondary | ICD-10-CM

## 2023-08-05 MED ORDER — CYANOCOBALAMIN 1000 MCG/ML IJ SOLN
1000.0000 ug | Freq: Once | INTRAMUSCULAR | Status: AC
Start: 2023-08-05 — End: 2023-08-05
  Administered 2023-08-05: 1000 ug via INTRAMUSCULAR

## 2023-08-05 NOTE — Progress Notes (Signed)
Per orders of Dr. Michael, injection of Cyanocobalamin 1000 mcg given by Mykal L Good. Patient tolerated injection well.  

## 2023-08-12 ENCOUNTER — Other Ambulatory Visit (INDEPENDENT_AMBULATORY_CARE_PROVIDER_SITE_OTHER): Payer: BC Managed Care – PPO

## 2023-08-12 DIAGNOSIS — E559 Vitamin D deficiency, unspecified: Secondary | ICD-10-CM | POA: Diagnosis not present

## 2023-08-13 LAB — VITAMIN D 25 HYDROXY (VIT D DEFICIENCY, FRACTURES): VITD: 38.61 ng/mL (ref 30.00–100.00)

## 2023-08-20 ENCOUNTER — Other Ambulatory Visit: Payer: Self-pay | Admitting: Internal Medicine

## 2023-09-03 ENCOUNTER — Ambulatory Visit: Payer: BC Managed Care – PPO

## 2023-09-03 ENCOUNTER — Ambulatory Visit (INDEPENDENT_AMBULATORY_CARE_PROVIDER_SITE_OTHER): Payer: BC Managed Care – PPO

## 2023-09-03 DIAGNOSIS — E538 Deficiency of other specified B group vitamins: Secondary | ICD-10-CM

## 2023-09-03 MED ORDER — CYANOCOBALAMIN 1000 MCG/ML IJ SOLN
1000.0000 ug | Freq: Once | INTRAMUSCULAR | Status: AC
Start: 2023-09-03 — End: 2023-09-03
  Administered 2023-09-03: 1000 ug via INTRAMUSCULAR

## 2023-09-03 NOTE — Progress Notes (Signed)
Per orders of Dr. Ardyth Harps, injection of B-12 given by Stann Ore. Patient tolerated injection well.

## 2023-09-08 ENCOUNTER — Other Ambulatory Visit: Payer: Self-pay | Admitting: Internal Medicine

## 2023-09-16 ENCOUNTER — Other Ambulatory Visit: Payer: Self-pay | Admitting: Internal Medicine

## 2023-09-16 DIAGNOSIS — I10 Essential (primary) hypertension: Secondary | ICD-10-CM

## 2023-09-21 MED ORDER — HYDROCHLOROTHIAZIDE 25 MG PO TABS: 25.0000 mg | ORAL_TABLET | Freq: Every day | ORAL | 1 refills | Status: DC

## 2023-09-21 NOTE — Addendum Note (Signed)
Addended by: Kern Reap B on: 09/21/2023 09:01 AM   Modules accepted: Orders

## 2023-09-23 ENCOUNTER — Ambulatory Visit: Payer: BC Managed Care – PPO | Admitting: Internal Medicine

## 2023-09-30 ENCOUNTER — Ambulatory Visit: Payer: BC Managed Care – PPO | Admitting: Internal Medicine

## 2023-09-30 ENCOUNTER — Telehealth: Payer: BC Managed Care – PPO | Admitting: Internal Medicine

## 2023-09-30 ENCOUNTER — Encounter: Payer: Self-pay | Admitting: Internal Medicine

## 2023-09-30 DIAGNOSIS — Z7985 Long-term (current) use of injectable non-insulin antidiabetic drugs: Secondary | ICD-10-CM

## 2023-09-30 DIAGNOSIS — I10 Essential (primary) hypertension: Secondary | ICD-10-CM

## 2023-09-30 DIAGNOSIS — E1169 Type 2 diabetes mellitus with other specified complication: Secondary | ICD-10-CM | POA: Diagnosis not present

## 2023-09-30 MED ORDER — AMLODIPINE BESYLATE 5 MG PO TABS
5.0000 mg | ORAL_TABLET | Freq: Every day | ORAL | 1 refills | Status: DC
Start: 2023-09-30 — End: 2024-05-22

## 2023-09-30 MED ORDER — HYDROCHLOROTHIAZIDE 25 MG PO TABS
25.0000 mg | ORAL_TABLET | Freq: Every day | ORAL | 1 refills | Status: DC
Start: 2023-09-30 — End: 2024-07-19

## 2023-09-30 MED ORDER — TIRZEPATIDE 5 MG/0.5ML ~~LOC~~ SOAJ
5.0000 mg | SUBCUTANEOUS | 0 refills | Status: DC
Start: 2023-09-30 — End: 2024-01-06

## 2023-09-30 NOTE — Progress Notes (Signed)
Virtual Visit via Video Note  I connected with Katie Morse on 09/30/23 at  2:00 PM EDT by a video enabled telemedicine application and verified that I am speaking with the correct person using two identifiers.  Location patient: home Location provider: work office Persons participating in the virtual visit: patient, provider  I discussed the limitations of evaluation and management by telemedicine and the availability of in person appointments. The patient expressed understanding and agreed to proceed.   HPI: She scheduled this visit for medication refills.  For reasons that are unclear to me she has been out of her amlodipine and hydrochlorothiazide for about 3 weeks.  She is also needing refills of her Greggory Keen but she has not been out of this 1.  She states her blood pressure has been elevated but cannot give me exact numbers.  She is otherwise feeling well.   ROS: Negative unless indicated in HPI.  Past Medical History:  Diagnosis Date   ALLERGIC RHINITIS 08/20/2008   Allergy    Anemia    Arthritis    Blood transfusion without reported diagnosis 2011   post surgery   Diabetes mellitus without complication (HCC)    DYSPNEA 11/12/2008   Dysrhythmia    a. fib   Headache(784.0) 08/20/2008   HYPERTENSION 08/20/2008   LEIOMYOMA, UTERUS 08/20/2008   PALPITATIONS, RECURRENT 11/12/2008   Sickle-cell trait (HCC) 08/20/2008   SYSTOLIC MURMUR 11/12/2008    Past Surgical History:  Procedure Laterality Date   LIPOMA EXCISION Left 07/19/2019   Procedure: EXCISION OF SUBCUTANEOUS LIPOMA LEFT BUTTOCK;  Surgeon: Manus Rudd, MD;  Location: [facility];  Service: General;  Laterality: Left;   MYOMECTOMY     fibroids   SHOULDER ARTHROSCOPY WITH OPEN ROTATOR CUFF REPAIR AND DISTAL CLAVICLE ACROMINECTOMY Right 08/13/2022   Procedure: right shoulder arthroscopy, debridement, biceps tenodesis, mini open rotator cuff tear repair;  Surgeon: Cammy Copa, MD;  Location: [facility];  Service: Orthopedics;  Laterality: Right;    Family History  Problem Relation Age of Onset   Hypertension Mother    Heart Problems Mother    Cancer Father        lung and prostate ca   Diabetes Other    Breast cancer Cousin        2-twins   Colon cancer Neg Hx     SOCIAL HX:   reports that she has never smoked. She has never used smokeless tobacco. She reports current alcohol use. She reports that she does not use drugs.   Current Outpatient Medications:    acetaminophen (TYLENOL) 650 MG CR tablet, Take 650-1,300 mg by mouth every 8 (eight) hours as needed for pain., Disp: , Rfl:    ASPIRIN LOW DOSE 81 MG EC tablet, TAKE 1 TABLET BY MOUTH EVERY DAY, Disp: 30 tablet, Rfl: 11   Aspirin-Caffeine (BAYER BACK & BODY) 500-32.5 MG TABS, Take 2 tablets by mouth at bedtime as needed (Pain)., Disp: , Rfl:    atorvastatin (LIPITOR) 80 MG tablet, Take 1 tablet (80 mg total) by mouth at bedtime., Disp: 90 tablet, Rfl: 1   Cranberry 250 MG CAPS, Orally, Disp: , Rfl:    Cyanocobalamin (VITAMIN B-12 IJ), Inject 1 mL as directed every 30 (thirty) days., Disp: , Rfl:    cyclobenzaprine (FLEXERIL) 5 MG tablet, TAKE 1 TABLET BY MOUTH AT BEDTIME AS NEEDED FOR MUSCLE SPASMS., Disp: 30 tablet, Rfl: 1   diclofenac (VOLTAREN) 75 MG EC tablet, TAKE 1 TABLET BY  MOUTH TWICE A DAY, Disp: 60 tablet, Rfl: 0   diclofenac Sodium (VOLTAREN) 1 % GEL, Apply 2 g topically daily as needed (arm pain)., Disp: , Rfl:    ezetimibe (ZETIA) 10 MG tablet, TAKE 1 TABLET BY MOUTH EVERY DAY, Disp: 90 tablet, Rfl: 1   fluticasone (FLONASE) 50 MCG/ACT nasal spray, Place 2 sprays into both nostrils daily as needed for allergies., Disp: , Rfl:    fluticasone (FLONASE) 50 MCG/ACT nasal spray, Place into the nose., Disp: , Rfl:    HYDROcodone-acetaminophen (NORCO) 5-325 MG tablet, Take 1 tablet by mouth every 6 (six) hours as needed for moderate pain., Disp: 35 tablet, Rfl: 0   hydrocortisone 2.5 % cream,  Apply topically 2 (two) times daily., Disp: 30 g, Rfl: 0   ketorolac (ACULAR) 0.5 % ophthalmic solution, SMARTSIG:In Eye(s), Disp: , Rfl:    losartan (COZAAR) 100 MG tablet, TAKE 1 TABLET BY MOUTH EVERY DAY, Disp: 90 tablet, Rfl: 1   metoprolol tartrate (LOPRESSOR) 25 MG tablet, TAKE 1 TABLET BY MOUTH TWICE A DAY, Disp: 180 tablet, Rfl: 1   moxifloxacin (VIGAMOX) 0.5 % ophthalmic solution, Apply to eye., Disp: , Rfl:    Multiple Vitamins-Minerals (HAIR/SKIN/NAILS/BIOTIN PO), Orally, Disp: , Rfl:    nitroGLYCERIN (NITROSTAT) 0.4 MG SL tablet, PLACE 1 TABLET UNDER THE TONGUE EVERY 5 MINUTES AS NEEDED FOR CHEST PAIN., Disp: 25 tablet, Rfl: 2   Omega-3 Fatty Acids (FISH OIL) 500 MG CAPS, 1 capsule Orally Twice a day, Disp: , Rfl:    Polyvinyl Alcohol-Povidone PF (REFRESH) 1.4-0.6 % SOLN, Place 1 drop into both eyes in the morning, at noon, in the evening, and at bedtime., Disp: , Rfl:    POTASSIUM PO, Potassium, Disp: , Rfl:    prednisoLONE acetate (PRED FORTE) 1 % ophthalmic suspension, one drop 4 (four) times daily., Disp: , Rfl:    Prenatal Vit-Fe Fumarate-FA (PRENATAL MULTIVITAMIN) TABS tablet, Take 1 tablet by mouth daily at 12 noon., Disp: , Rfl:    tirzepatide (MOUNJARO) 5 MG/0.5ML Pen, Inject 5 mg into the skin once a week., Disp: 6 mL, Rfl: 0   trimethoprim-polymyxin b (POLYTRIM) ophthalmic solution, Place 2 drops into both eyes every 4 (four) hours., Disp: 10 mL, Rfl: 0   VENTOLIN HFA 108 (90 Base) MCG/ACT inhaler, INHALE 1 PUFF INTO THE LUNGS EVERY 6 HOURS AS NEEDED FOR WHEEZING OR SHORTNESS OF BREATH., Disp: 18 each, Rfl: 3   vitamin C (ASCORBIC ACID) 500 MG tablet, Take 500 mg by mouth daily., Disp: , Rfl:    vitamin E 200 UNIT capsule, one capsule (200 Units dose)., Disp: , Rfl:    amLODipine (NORVASC) 5 MG tablet, Take 1 tablet (5 mg total) by mouth daily., Disp: 90 tablet, Rfl: 1   hydrochlorothiazide (HYDRODIURIL) 25 MG tablet, Take 1 tablet (25 mg total) by mouth daily., Disp: 90  tablet, Rfl: 1  EXAM:   VITALS per patient if applicable: None reported  GENERAL: alert, oriented, appears well and in no acute distress  HEENT: atraumatic, conjunttiva clear, no obvious abnormalities on inspection of external nose and ears  NECK: normal movements of the head and neck  LUNGS: on inspection no signs of respiratory distress, breathing rate appears normal, no obvious gross increased work of breathing, gasping or wheezing  CV: no obvious cyanosis  MS: moves all visible extremities without noticeable abnormality  PSYCH/NEURO: pleasant and cooperative, no obvious depression or anxiety, speech and thought processing grossly intact  ASSESSMENT AND PLAN:   Type 2 diabetes  mellitus with other specified complication, without long-term current use of insulin (HCC) - Plan: tirzepatide (MOUNJARO) 5 MG/0.5ML Pen  Essential hypertension - Plan: amLODipine (NORVASC) 5 MG tablet, hydrochlorothiazide (HYDRODIURIL) 25 MG tablet  -Refill amlodipine and hydrochlorothiazide for blood pressure.  Return in 3 months for follow-up. -Increase Mounjaro to 5 mg and follow-up in 3 months for repeat A1c.   I discussed the assessment and treatment plan with the patient. The patient was provided an opportunity to ask questions and all were answered. The patient agreed with the plan and demonstrated an understanding of the instructions.   The patient was advised to call back or seek an in-person evaluation if the symptoms worsen or if the condition fails to improve as anticipated.    Katie Jan, MD  [facility]

## 2023-10-07 ENCOUNTER — Ambulatory Visit: Payer: BC Managed Care – PPO

## 2023-10-07 ENCOUNTER — Encounter: Payer: Self-pay | Admitting: *Deleted

## 2023-10-07 ENCOUNTER — Telehealth: Payer: Self-pay | Admitting: Internal Medicine

## 2023-10-07 DIAGNOSIS — E538 Deficiency of other specified B group vitamins: Secondary | ICD-10-CM

## 2023-10-07 MED ORDER — CYANOCOBALAMIN 1000 MCG/ML IJ SOLN
1000.0000 ug | Freq: Once | INTRAMUSCULAR | Status: AC
Start: 2023-10-07 — End: 2023-10-07
  Administered 2023-10-07: 1000 ug via INTRAMUSCULAR

## 2023-10-07 NOTE — Progress Notes (Signed)
Per orders of Dr. Ardyth Harps, injection of Cyanocobalamin 1000 mcg given by Yazir Koerber L Yarah Fuente. Patient tolerated injection well.

## 2023-10-07 NOTE — Telephone Encounter (Signed)
Requesting a note to excuse her for work on Monday so that she can rest. Says she will be here for b-12 shot at 3 and asks for the letter to be ready then

## 2023-10-08 ENCOUNTER — Ambulatory Visit (INDEPENDENT_AMBULATORY_CARE_PROVIDER_SITE_OTHER): Payer: BC Managed Care – PPO | Admitting: Family Medicine

## 2023-10-08 ENCOUNTER — Encounter: Payer: Self-pay | Admitting: Family Medicine

## 2023-10-08 VITALS — BP 148/92 | HR 86 | Temp 98.3°F | Ht 62.0 in | Wt 197.6 lb

## 2023-10-08 DIAGNOSIS — I10 Essential (primary) hypertension: Secondary | ICD-10-CM

## 2023-10-08 DIAGNOSIS — Z7985 Long-term (current) use of injectable non-insulin antidiabetic drugs: Secondary | ICD-10-CM

## 2023-10-08 DIAGNOSIS — R0789 Other chest pain: Secondary | ICD-10-CM

## 2023-10-08 DIAGNOSIS — E1169 Type 2 diabetes mellitus with other specified complication: Secondary | ICD-10-CM

## 2023-10-08 NOTE — Progress Notes (Signed)
Established Patient Office Visit  Subjective   Patient ID: Katie Morse, female    DOB: 1966/01/17  Age: 57 y.o. MRN: [id number]  Chief Complaint  Patient presents with   Chest Pain    Patinet complains of chest pain, x1 week    Shortness of Breath    Patient complains of shortness of breath, x1 week     HPI   [name] is seen with intermittent chest pain and shortness of breath past week.  She has had very similar episodes multiple times in the past.  She apparently uses nitroglycerin with chest pain episodes and that seems to help.  She feels like her chest pains may be related to increased stress issues.  She had CT coronary morphology study back in 2020 with coronary calcium score of 0.  There was comment of left anterior descending artery making a 90 degree bend at the ostium but no significant stenosis in the vessel.  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  No cough.  No fever.  No pleuritic pain.  Non-smoker.  She works at [employer] and when out of work on the 16th and is requesting note to go back on the 22nd.  She has type 2 diabetes currently on Mounjaro 5 mg subcutaneous once weekly.  Her weight has plateaued.  Last A1c 7.7%.  Does not appear to have any follow-up currently with primary.  She has hypertension treated with multidrug regimen including amlodipine, HCTZ, losartan, and metoprolol.  Tries to watch sodium intake.  No regular alcohol use.  Past Medical History:  Diagnosis Date   ALLERGIC RHINITIS 08/20/2008   Allergy    Anemia    Arthritis    Blood transfusion without reported diagnosis 2011   post surgery   Diabetes mellitus without complication (HCC)    DYSPNEA 11/12/2008   Dysrhythmia    a. fib   Headache(784.0) 08/20/2008   HYPERTENSION 08/20/2008   LEIOMYOMA, UTERUS 08/20/2008   PALPITATIONS, RECURRENT 11/12/2008   Sickle-cell trait (HCC) 08/20/2008   SYSTOLIC MURMUR 11/12/2008   Past Surgical History:   Procedure Laterality Date   LIPOMA EXCISION Left 07/19/2019   Procedure: EXCISION OF SUBCUTANEOUS LIPOMA LEFT BUTTOCK;  Surgeon: Manus Rudd, MD;  Location: [facility];  Service: General;  Laterality: Left;   MYOMECTOMY     fibroids   SHOULDER ARTHROSCOPY WITH OPEN ROTATOR CUFF REPAIR AND DISTAL CLAVICLE ACROMINECTOMY Right 08/13/2022   Procedure: right shoulder arthroscopy, debridement, biceps tenodesis, mini open rotator cuff tear repair;  Surgeon: Cammy Copa, MD;  Location: [facility];  Service: Orthopedics;  Laterality: Right;    reports that she has never smoked. She has never used smokeless tobacco. She reports current alcohol use. She reports that she does not use drugs. family history includes Breast cancer in her cousin; Cancer in her father; Diabetes in an other family member; Heart Problems in her mother; Hypertension in her mother. Allergies  Allergen Reactions   Latex Rash   Wild Lettuce Extract (Lactuca Virosa) Other (See Comments)    Gi -upset Other reaction(s): Other Gi -upset   Avocado Nausea Only   Lactose Intolerance (Gi)     Gi-upset   Metformin Hcl Diarrhea   Other Other (See Comments)    [facility] /Gi upset   Watermelon Flavor     Gi upset    Review of Systems  Constitutional:  Negative for malaise/fatigue.  Eyes:  Negative for blurred vision.  Respiratory:  Negative for cough and shortness  of breath.   Cardiovascular:  Positive for chest pain. Negative for palpitations, leg swelling and PND.  Neurological:  Negative for dizziness, weakness and headaches.      Objective:     BP (!) 148/92 (BP Location: Left Arm, Cuff Size: Normal)   Pulse 86   Temp 98.3 F (36.8 C) (Oral)   Ht 5\' 2"  (1.575 m)   Wt 197 lb 9.6 oz (89.6 kg)   LMP 07/04/2014 Comment: GYN  SpO2 98%   BMI 36.14 kg/m  BP Readings from Last 3 Encounters:  10/08/23 (!) 148/92  04/29/23 126/82  01/21/23 111/65   Wt Readings from Last 3 Encounters:  10/08/23 197  lb 9.6 oz (89.6 kg)  04/29/23 194 lb (88 kg)  01/21/23 185 lb 6.4 oz (84.1 kg)      Physical Exam Vitals reviewed.  Constitutional:      Appearance: She is well-developed.  Eyes:     Pupils: Pupils are equal, round, and reactive to light.  Neck:     Thyroid: No thyromegaly.     Vascular: No JVD.  Cardiovascular:     Rate and Rhythm: Normal rate and regular rhythm.     Heart sounds:     No gallop.  Pulmonary:     Effort: Pulmonary effort is normal. No respiratory distress.     Breath sounds: Normal breath sounds. No wheezing or rales.  Musculoskeletal:     Cervical back: Neck supple.  Neurological:     Mental Status: She is alert.      No results found for any visits on 10/08/23.  Last CBC Lab Results  Component Value Date   WBC 6.2 09/01/2022   HGB 11.9 (L) 09/01/2022   HCT 35.9 (L) 09/01/2022   MCV 80.9 09/01/2022   MCH 26.7 08/05/2022   RDW 14.4 09/01/2022   PLT 350.0 09/01/2022   Last metabolic panel Lab Results  Component Value Date   GLUCOSE 126 (H) 09/01/2022   NA 139 09/01/2022   K 3.5 09/01/2022   CL 101 09/01/2022   CO2 28 09/01/2022   BUN 10 09/01/2022   CREATININE 0.68 09/01/2022   GFR 96.74 09/01/2022   CALCIUM 9.7 09/01/2022   PHOS 3.1 11/23/2010   PROT 8.1 09/01/2022   ALBUMIN 4.1 09/01/2022   BILITOT 0.5 09/01/2022   ALKPHOS 90 09/01/2022   AST 19 09/01/2022   ALT 26 09/01/2022   ANIONGAP 8 08/05/2022   Last lipids Lab Results  Component Value Date   CHOL 123 04/29/2023   HDL 49.00 04/29/2023   LDLCALC 52 04/29/2023   LDLDIRECT 135.0 05/11/2012   TRIG 110.0 04/29/2023   CHOLHDL 3 04/29/2023   Last hemoglobin A1c Lab Results  Component Value Date   HGBA1C 7.7 (A) 04/29/2023      The ASCVD Risk score (Arnett DK, et al., 2019) failed to calculate for the following reasons:   The valid total cholesterol range is 130 to 320 mg/dL 57 yo AA female with diet-controlled DM, HTN and atypical chest pain   EXAM: Cardiac/Coronary  CTA   TECHNIQUE: The patient was scanned on a [employer].   PROTOCOL: A 120 kV prospective scan was triggered in the descending thoracic aorta at 111 HU's. Axial non-contrast 3 mm slices were carried out through the heart. The data set was analyzed on a dedicated work station and scored using the Agatson method. Gantry rotation speed was 250 msecs and collimation was .6 mm. Beta blockade and 0.8 mg of sl  NTG was given. The 3D data set was reconstructed in 5% intervals of the 67-82 % of the R-R cycle. Diastolic phases were analyzed on a dedicated work station using MPR, MIP and VRT modes. The patient received 80mL OMNIPAQUE IOHEXOL 350 MG/ML SOLN of contrast.   FINDINGS: Coronary calcium score: The patient's coronary artery calcium score is 0, which places the patient in the 0 percentile.   Coronary arteries: Normal coronary origins.  Right dominance.   Right Coronary Artery: Dominant. Tortuous vessel without significant disease.   Left Main Coronary Artery: Longer segment with tortuous course. Free of disease. Bifurcates in a pitchfork fashion into the LAD, LCx and a small ramus branch at the crux, which is non-obstructive.   Left Anterior Descending Coronary Artery: The proximal LAD makes a sharp 90 degree bend at the ostium. There is no significant stenosis in the vessel. 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 A small diagonal branch free of disease.   Left Circumflex Artery: The circumflex artery is a large vessel and makes a 90 degree bend at the ostium. There is a large marginal branch. No significant stenosis.   Aorta: Normal size, 29 mm at the mid ascending aorta (level of the PA bifurcation) measured double oblique. No calcifications. No dissection.   Aortic Valve: Trileaflet.  No calcifications.   Other findings:   Normal pulmonary vein drainage into the left atrium.   Normal left atrial appendage without a thrombus.   Normal size of the  pulmonary artery.   IMPRESSION: 1. No evidence of CAD, CADRADS = 0. 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   2. Coronary calcium score of 0. This was 0 percentile for age and sex matched control.   3. Normal coronary origin with right dominance    Assessment & Plan:   #1 atypical chest pains.  She has had these apparently for years with CT coronary morphology studies as above with coronary calcium score of 0   4 years ago.  Currently denies any chest pain whatsoever.  She states she has history of stress-induced chest pain which usually interestingly responds to nitroglycerin.  This was reportedly prescribed per cardiology.  No recent exertional chest pains.  Work note written from 10/16 through the 21st with return to work on 10-12-2023  #2 hypertension poorly controlled.  Repeat reading did come down substantially from initial of 180/100 to 148/92.  Discussed low-sodium diet.  Handout on DASH diet given.  Recommend she schedule follow-up with her primary soon to reassess.  Continue current regimen above including amlodipine, HCTZ, losartan, metoprolol.  #3 type 2 diabetes.  Last A1c 7.7%.  Patient on Mounjaro.  Needs follow-up with primary.  She is encouraged to set this up soon.  May need further titration of Mounjaro to get to goal  Evelena Peat, MD

## 2023-10-21 ENCOUNTER — Other Ambulatory Visit: Payer: Self-pay | Admitting: Internal Medicine

## 2023-10-21 DIAGNOSIS — I10 Essential (primary) hypertension: Secondary | ICD-10-CM

## 2023-10-21 DIAGNOSIS — G8929 Other chronic pain: Secondary | ICD-10-CM

## 2023-10-21 DIAGNOSIS — E559 Vitamin D deficiency, unspecified: Secondary | ICD-10-CM

## 2023-11-17 ENCOUNTER — Other Ambulatory Visit: Payer: Self-pay | Admitting: Internal Medicine

## 2023-11-22 ENCOUNTER — Other Ambulatory Visit: Payer: Self-pay | Admitting: Internal Medicine

## 2023-12-07 ENCOUNTER — Ambulatory Visit: Payer: BC Managed Care – PPO

## 2023-12-09 ENCOUNTER — Ambulatory Visit: Payer: BC Managed Care – PPO

## 2023-12-09 DIAGNOSIS — E538 Deficiency of other specified B group vitamins: Secondary | ICD-10-CM

## 2023-12-09 MED ORDER — CYANOCOBALAMIN 1000 MCG/ML IJ SOLN
1000.0000 ug | Freq: Once | INTRAMUSCULAR | Status: AC
Start: 2023-12-09 — End: 2023-12-09
  Administered 2023-12-09: 1000 ug via INTRAMUSCULAR

## 2023-12-09 NOTE — Progress Notes (Signed)
Per orders of Dr. Hernandez, injection of B12 given by Dailyn Reith. Patient tolerated injection well.  

## 2023-12-14 ENCOUNTER — Other Ambulatory Visit: Payer: Self-pay | Admitting: Internal Medicine

## 2024-01-04 ENCOUNTER — Encounter: Payer: BC Managed Care – PPO | Admitting: Internal Medicine

## 2024-01-06 ENCOUNTER — Other Ambulatory Visit: Payer: BC Managed Care – PPO

## 2024-01-06 ENCOUNTER — Ambulatory Visit (INDEPENDENT_AMBULATORY_CARE_PROVIDER_SITE_OTHER): Payer: BC Managed Care – PPO | Admitting: Internal Medicine

## 2024-01-06 ENCOUNTER — Other Ambulatory Visit: Payer: Self-pay

## 2024-01-06 ENCOUNTER — Encounter: Payer: Self-pay | Admitting: Internal Medicine

## 2024-01-06 VITALS — BP 130/84 | HR 88 | Ht 62.0 in | Wt 186.9 lb

## 2024-01-06 DIAGNOSIS — I10 Essential (primary) hypertension: Secondary | ICD-10-CM

## 2024-01-06 DIAGNOSIS — Z114 Encounter for screening for human immunodeficiency virus [HIV]: Secondary | ICD-10-CM

## 2024-01-06 DIAGNOSIS — Z0000 Encounter for general adult medical examination without abnormal findings: Secondary | ICD-10-CM | POA: Diagnosis not present

## 2024-01-06 DIAGNOSIS — E538 Deficiency of other specified B group vitamins: Secondary | ICD-10-CM | POA: Diagnosis not present

## 2024-01-06 DIAGNOSIS — E785 Hyperlipidemia, unspecified: Secondary | ICD-10-CM

## 2024-01-06 DIAGNOSIS — E1169 Type 2 diabetes mellitus with other specified complication: Secondary | ICD-10-CM

## 2024-01-06 DIAGNOSIS — Z124 Encounter for screening for malignant neoplasm of cervix: Secondary | ICD-10-CM

## 2024-01-06 DIAGNOSIS — E559 Vitamin D deficiency, unspecified: Secondary | ICD-10-CM | POA: Diagnosis not present

## 2024-01-06 DIAGNOSIS — Z23 Encounter for immunization: Secondary | ICD-10-CM

## 2024-01-06 DIAGNOSIS — Z1231 Encounter for screening mammogram for malignant neoplasm of breast: Secondary | ICD-10-CM

## 2024-01-06 DIAGNOSIS — Z1159 Encounter for screening for other viral diseases: Secondary | ICD-10-CM

## 2024-01-06 LAB — VITAMIN D 25 HYDROXY (VIT D DEFICIENCY, FRACTURES): VITD: 20.26 ng/mL — ABNORMAL LOW (ref 30.00–100.00)

## 2024-01-06 LAB — MICROALBUMIN / CREATININE URINE RATIO
Creatinine,U: 63.8 mg/dL
Microalb Creat Ratio: 2.9 mg/g (ref 0.0–30.0)
Microalb, Ur: 1.8 mg/dL (ref 0.0–1.9)

## 2024-01-06 LAB — LIPID PANEL
Cholesterol: 163 mg/dL (ref 0–200)
HDL: 61.6 mg/dL (ref >39.00)
LDL Cholesterol: 88 mg/dL (ref 0–99)
NonHDL: 101.88
Total CHOL/HDL Ratio: 3
Triglycerides: 69 mg/dL (ref 0.0–149.0)
VLDL: 13.8 mg/dL (ref 0.0–40.0)

## 2024-01-06 LAB — COMPREHENSIVE METABOLIC PANEL
ALT: 20 U/L (ref 0–35)
AST: 17 U/L (ref 0–37)
Albumin: 4.4 g/dL (ref 3.5–5.2)
Alkaline Phosphatase: 87 U/L (ref 39–117)
BUN: 9 mg/dL (ref 6–23)
CO2: 30 meq/L (ref 19–32)
Calcium: 9.9 mg/dL (ref 8.4–10.5)
Chloride: 101 meq/L (ref 96–112)
Creatinine, Ser: 0.68 mg/dL (ref 0.40–1.20)
GFR: 95.83 mL/min (ref >60.00)
Glucose, Bld: 153 mg/dL — ABNORMAL HIGH (ref 70–99)
Potassium: 4 meq/L (ref 3.5–5.1)
Sodium: 139 meq/L (ref 135–145)
Total Bilirubin: 0.8 mg/dL (ref 0.2–1.2)
Total Protein: 7.6 g/dL (ref 6.0–8.3)

## 2024-01-06 LAB — CBC WITH DIFFERENTIAL/PLATELET
Basophils Absolute: 0 10*3/uL (ref 0.0–0.1)
Basophils Relative: 0.4 % (ref 0.0–3.0)
Eosinophils Absolute: 0.1 10*3/uL (ref 0.0–0.7)
Eosinophils Relative: 3 % (ref 0.0–5.0)
HCT: 39 % (ref 36.0–46.0)
Hemoglobin: 12.9 g/dL (ref 12.0–15.0)
Lymphocytes Relative: 30.5 % (ref 12.0–46.0)
Lymphs Abs: 1.3 10*3/uL (ref 0.7–4.0)
MCHC: 33 g/dL (ref 30.0–36.0)
MCV: 81.6 fL (ref 78.0–100.0)
Monocytes Absolute: 0.3 10*3/uL (ref 0.1–1.0)
Monocytes Relative: 7.2 % (ref 3.0–12.0)
Neutro Abs: 2.6 10*3/uL (ref 1.4–7.7)
Neutrophils Relative %: 58.9 % (ref 43.0–77.0)
Platelets: 265 10*3/uL (ref 150.0–400.0)
RBC: 4.78 Mil/uL (ref 3.87–5.11)
RDW: 14.6 % (ref 11.5–15.5)
WBC: 4.4 10*3/uL (ref 4.0–10.5)

## 2024-01-06 LAB — TSH: TSH: 0.98 u[IU]/mL (ref 0.35–5.50)

## 2024-01-06 LAB — VITAMIN B12: Vitamin B-12: >1537 pg/mL — ABNORMAL HIGH (ref 211–911)

## 2024-01-06 MED ORDER — CYANOCOBALAMIN 1000 MCG/ML IJ SOLN
1000.0000 ug | Freq: Once | INTRAMUSCULAR | Status: AC
  Administered 2024-01-06: 1000 ug via INTRAMUSCULAR

## 2024-01-06 MED ORDER — TIRZEPATIDE 7.5 MG/0.5ML ~~LOC~~ SOAJ: 7.5000 mg | SUBCUTANEOUS | 0 refills | Status: DC

## 2024-01-06 NOTE — Progress Notes (Signed)
Established Patient Office Visit     CC/Reason for Visit: Annual preventive exam, follow-up chronic conditions  HPI: Katie Morse is a 58 y.o. female who is coming in today for the above mentioned reasons. Past Medical History is significant for: Hypertension, hyperlipidemia, type 2 diabetes, obesity, vitamin D and B12 deficiency.  Feeling well without concerns or complaints.  She is due for Tdap, for mammogram and Pap smear.  She would like to increase her Mounjaro up from 5 mg.  She is tolerating this well.  She is overdue for diabetic eye exam, she will call and schedule.   Past Medical/Surgical History: Past Medical History:  Diagnosis Date   ALLERGIC RHINITIS 08/20/2008   Allergy    Anemia    Arthritis    Blood transfusion without reported diagnosis 2011   post surgery   Diabetes mellitus without complication (HCC)    DYSPNEA 11/12/2008   Dysrhythmia    a. fib   Headache(784.0) 08/20/2008   HYPERTENSION 08/20/2008   LEIOMYOMA, UTERUS 08/20/2008   PALPITATIONS, RECURRENT 11/12/2008   Sickle-cell trait (HCC) 08/20/2008   SYSTOLIC MURMUR 11/12/2008    Past Surgical History:  Procedure Laterality Date   LIPOMA EXCISION Left 07/19/2019   Procedure: EXCISION OF SUBCUTANEOUS LIPOMA LEFT BUTTOCK;  Surgeon: Manus Rudd, MD;  Location: [facility];  Service: General;  Laterality: Left;   MYOMECTOMY     fibroids   SHOULDER ARTHROSCOPY WITH OPEN ROTATOR CUFF REPAIR AND DISTAL CLAVICLE ACROMINECTOMY Right 08/13/2022   Procedure: right shoulder arthroscopy, debridement, biceps tenodesis, mini open rotator cuff tear repair;  Surgeon: Cammy Copa, MD;  Location: [facility];  Service: Orthopedics;  Laterality: Right;    Social History:  reports that she has never smoked. She has never used smokeless tobacco. She reports current alcohol use. She reports that she does not use drugs.  Allergies: Allergies  Allergen Reactions   Latex Rash   Wild  Lettuce Extract (Lactuca Virosa) Other (See Comments)    Gi -upset Other reaction(s): Other Gi -upset   Avocado Nausea Only   Lactose Intolerance (Gi)     Gi-upset   Metformin Hcl Diarrhea   Other Other (See Comments)    [facility] /Gi upset   Watermelon Flavoring Agent (Non-Screening)     Gi upset    Family History:  Family History  Problem Relation Age of Onset   Hypertension Mother    Heart Problems Mother    Cancer Father        lung and prostate ca   Diabetes Other    Breast cancer Cousin        2-twins   Colon cancer Neg Hx      Current Outpatient Medications:    acetaminophen (TYLENOL) 650 MG CR tablet, Take 650-1,300 mg by mouth every 8 (eight) hours as needed for pain., Disp: , Rfl:    amLODipine (NORVASC) 5 MG tablet, Take 1 tablet (5 mg total) by mouth daily., Disp: 90 tablet, Rfl: 1   ASPIRIN LOW DOSE 81 MG tablet, TAKE 1 TABLET BY MOUTH EVERY DAY, Disp: 30 tablet, Rfl: 11   Aspirin-Caffeine (BAYER BACK & BODY) 500-32.5 MG TABS, Take 2 tablets by mouth at bedtime as needed (Pain)., Disp: , Rfl:    atorvastatin (LIPITOR) 80 MG tablet, Take 1 tablet (80 mg total) by mouth at bedtime., Disp: 90 tablet, Rfl: 1   Cranberry 250 MG CAPS, Orally, Disp: , Rfl:    Cyanocobalamin (VITAMIN B-12 IJ), Inject  1 mL as directed every 30 (thirty) days., Disp: , Rfl:    cyclobenzaprine (FLEXERIL) 5 MG tablet, TAKE 1 TABLET BY MOUTH AT BEDTIME AS NEEDED FOR MUSCLE SPASMS., Disp: 30 tablet, Rfl: 1   diclofenac (VOLTAREN) 75 MG EC tablet, TAKE 1 TABLET BY MOUTH TWICE A DAY, Disp: 60 tablet, Rfl: 0   diclofenac Sodium (VOLTAREN) 1 % GEL, Apply 2 g topically daily as needed (arm pain)., Disp: , Rfl:    ezetimibe (ZETIA) 10 MG tablet, TAKE 1 TABLET BY MOUTH EVERY DAY, Disp: 90 tablet, Rfl: 1   fluticasone (FLONASE) 50 MCG/ACT nasal spray, Place 2 sprays into both nostrils daily as needed for allergies., Disp: , Rfl:    fluticasone (FLONASE) 50 MCG/ACT nasal spray, Place into the nose.,  Disp: , Rfl:    hydrochlorothiazide (HYDRODIURIL) 25 MG tablet, Take 1 tablet (25 mg total) by mouth daily., Disp: 90 tablet, Rfl: 1   HYDROcodone-acetaminophen (NORCO) 5-325 MG tablet, Take 1 tablet by mouth every 6 (six) hours as needed for moderate pain., Disp: 35 tablet, Rfl: 0   hydrocortisone 2.5 % cream, Apply topically 2 (two) times daily., Disp: 30 g, Rfl: 0   ketorolac (ACULAR) 0.5 % ophthalmic solution, SMARTSIG:In Eye(s), Disp: , Rfl:    losartan (COZAAR) 100 MG tablet, TAKE 1 TABLET BY MOUTH EVERY DAY, Disp: 90 tablet, Rfl: 1   metoprolol tartrate (LOPRESSOR) 25 MG tablet, TAKE 1 TABLET BY MOUTH TWICE A DAY, Disp: 180 tablet, Rfl: 1   moxifloxacin (VIGAMOX) 0.5 % ophthalmic solution, Apply to eye., Disp: , Rfl:    Multiple Vitamins-Minerals (HAIR/SKIN/NAILS/BIOTIN PO), Orally, Disp: , Rfl:    nitroGLYCERIN (NITROSTAT) 0.4 MG SL tablet, PLACE 1 TABLET UNDER THE TONGUE EVERY 5 MINUTES AS NEEDED FOR CHEST PAIN., Disp: 25 tablet, Rfl: 2   Omega-3 Fatty Acids (FISH OIL) 500 MG CAPS, 1 capsule Orally Twice a day, Disp: , Rfl:    Polyvinyl Alcohol-Povidone PF (REFRESH) 1.4-0.6 % SOLN, Place 1 drop into both eyes in the morning, at noon, in the evening, and at bedtime., Disp: , Rfl:    POTASSIUM PO, Potassium, Disp: , Rfl:    prednisoLONE acetate (PRED FORTE) 1 % ophthalmic suspension, one drop 4 (four) times daily., Disp: , Rfl:    Prenatal Vit-Fe Fumarate-FA (PRENATAL MULTIVITAMIN) TABS tablet, Take 1 tablet by mouth daily at 12 noon., Disp: , Rfl:    tirzepatide (MOUNJARO) 7.5 MG/0.5ML Pen, Inject 7.5 mg into the skin once a week., Disp: 6 mL, Rfl: 0   trimethoprim-polymyxin b (POLYTRIM) ophthalmic solution, Place 2 drops into both eyes every 4 (four) hours., Disp: 10 mL, Rfl: 0   VENTOLIN HFA 108 (90 Base) MCG/ACT inhaler, INHALE 1 PUFF INTO THE LUNGS EVERY 6 HOURS AS NEEDED FOR WHEEZING OR SHORTNESS OF BREATH., Disp: 18 each, Rfl: 3   vitamin C (ASCORBIC ACID) 500 MG tablet, Take 500 mg  by mouth daily., Disp: , Rfl:    vitamin E 200 UNIT capsule, one capsule (200 Units dose)., Disp: , Rfl:   Review of Systems:  Negative unless indicated in HPI.   Physical Exam: Vitals:   01/06/24 1012  BP: 130/84  Pulse: 88  SpO2: 98%  Weight: 186 lb 14.4 oz (84.8 kg)  Height: 5\' 2"  (1.575 m)    Body mass index is 34.18 kg/m.   Physical Exam Vitals reviewed.  Constitutional:      General: She is not in acute distress.    Appearance: Normal appearance. She is obese. She  is not ill-appearing, toxic-appearing or diaphoretic.  HENT:     Head: Normocephalic.     Right Ear: Tympanic membrane, ear canal and external ear normal. There is no impacted cerumen.     Left Ear: Tympanic membrane, ear canal and external ear normal. There is no impacted cerumen.     Nose: Nose normal.     Mouth/Throat:     Mouth: Mucous membranes are moist.     Pharynx: Oropharynx is clear. No oropharyngeal exudate or posterior oropharyngeal erythema.  Eyes:     General: No scleral icterus.       Right eye: No discharge.        Left eye: No discharge.     Conjunctiva/sclera: Conjunctivae normal.     Pupils: Pupils are equal, round, and reactive to light.  Neck:     Vascular: No carotid bruit.  Cardiovascular:     Rate and Rhythm: Normal rate and regular rhythm.     Pulses: Normal pulses.     Heart sounds: Normal heart sounds.  Pulmonary:     Effort: Pulmonary effort is normal. No respiratory distress.     Breath sounds: Normal breath sounds.  Abdominal:     General: Abdomen is flat. Bowel sounds are normal.     Palpations: Abdomen is soft.  Musculoskeletal:        General: Normal range of motion.     Cervical back: Normal range of motion.  Skin:    General: Skin is warm and dry.  Neurological:     General: No focal deficit present.     Mental Status: She is alert and oriented to person, place, and time. Mental status is at baseline.  Psychiatric:        Mood and Affect: Mood normal.         Behavior: Behavior normal.        Thought Content: Thought content normal.        Judgment: Judgment normal.     Flowsheet Row Office Visit from 04/29/2023 in [facility] [place]  PHQ-9 Total Score 6        Impression and Plan:  Encounter for preventive health examination  Vitamin B12 deficiency -     Vitamin B12; Future  Type 2 diabetes mellitus with other specified complication, without long-term current use of insulin (HCC) -     Hemoglobin A1c; Future -     CBC with Differential/Platelet; Future -     Comprehensive metabolic panel; Future -     Lipid panel; Future -     Microalbumin / creatinine urine ratio; Future -     Tirzepatide; Inject 7.5 mg into the skin once a week.  Dispense: 6 mL; Refill: 0  Vitamin D deficiency -     VITAMIN D 25 Hydroxy (Vit-D Deficiency, Fractures); Future  Morbid obesity (HCC) -     TSH; Future  Hyperlipidemia associated with type 2 diabetes mellitus (HCC)  Essential hypertension  Encounter for screening mammogram for malignant neoplasm of breast -     Digital Screening Mammogram, Left and Right; Future  Screening for cervical cancer -     Ambulatory referral to Gynecology  Encounter for hepatitis C screening test for low risk patient -     Hepatitis C antibody; Future  Encounter for screening for HIV -     HIV Antibody (routine testing w rflx); Future  Immunization due   -Recommend routine eye and dental care. -Healthy lifestyle discussed in detail. -  Labs to be updated today. -Prostate cancer screening: N/A Health Maintenance  Topic Date Due   HIV Screening  Never done   Hepatitis C Screening  Never done   Pneumococcal Vaccination (2 of 2 - PCV) 02/18/2020   COVID-19 Vaccine (4 - 2024-25 season) 08/22/2023   Yearly kidney function blood test for diabetes  09/02/2023   Yearly kidney health urinalysis for diabetes  09/02/2023   Eye exam for diabetics  09/02/2023   DTaP/Tdap/Td vaccine (2 -  Td or Tdap) 09/06/2023   Mammogram  10/03/2023   Hemoglobin A1C  10/30/2023   Pap with HPV screening  07/03/2024   Complete foot exam   01/05/2025   Colon Cancer Screening  03/12/2026   Flu Shot  Completed   Zoster (Shingles) Vaccine  Completed   HPV Vaccine  Aged Out     -Tdap administered in office today. -Referrals placed for mammogram and GYN, had a colonoscopy in 2017. -Increase Mounjaro to 7.5 mg.     Chaya Jan, MD [facility]

## 2024-01-06 NOTE — Addendum Note (Signed)
Addended by: Kern Reap B on: 01/06/2024 11:27 AM   Modules accepted: Orders

## 2024-01-07 LAB — HEPATITIS C ANTIBODY: Hepatitis C Ab: NONREACTIVE

## 2024-01-07 LAB — HIV ANTIBODY (ROUTINE TESTING W REFLEX): HIV 1&2 Ab, 4th Generation: NONREACTIVE

## 2024-01-07 LAB — HEMOGLOBIN A1C
Hgb A1c MFr Bld: 11.6 %{Hb} — ABNORMAL HIGH (ref <5.7)
Mean Plasma Glucose: 286 mg/dL
eAG (mmol/L): 15.9 mmol/L

## 2024-01-10 ENCOUNTER — Encounter: Payer: Self-pay | Admitting: Internal Medicine

## 2024-01-10 ENCOUNTER — Other Ambulatory Visit: Payer: Self-pay | Admitting: Internal Medicine

## 2024-01-10 ENCOUNTER — Other Ambulatory Visit: Payer: Self-pay | Admitting: *Deleted

## 2024-01-10 DIAGNOSIS — E559 Vitamin D deficiency, unspecified: Secondary | ICD-10-CM

## 2024-01-10 DIAGNOSIS — E1169 Type 2 diabetes mellitus with other specified complication: Secondary | ICD-10-CM

## 2024-01-10 MED ORDER — DAPAGLIFLOZIN PROPANEDIOL 5 MG PO TABS: 5.0000 mg | ORAL_TABLET | Freq: Every day | ORAL | 1 refills | Status: DC

## 2024-01-10 MED ORDER — VITAMIN D (ERGOCALCIFEROL) 1.25 MG (50000 UNIT) PO CAPS: 50000.0000 [IU] | ORAL_CAPSULE | ORAL | 0 refills | Status: AC

## 2024-01-11 ENCOUNTER — Ambulatory Visit: Payer: BC Managed Care – PPO

## 2024-01-14 ENCOUNTER — Telehealth: Payer: Self-pay

## 2024-01-14 DIAGNOSIS — E538 Deficiency of other specified B group vitamins: Secondary | ICD-10-CM

## 2024-01-14 DIAGNOSIS — I10 Essential (primary) hypertension: Secondary | ICD-10-CM

## 2024-01-14 DIAGNOSIS — E1169 Type 2 diabetes mellitus with other specified complication: Secondary | ICD-10-CM

## 2024-01-14 DIAGNOSIS — E785 Hyperlipidemia, unspecified: Secondary | ICD-10-CM

## 2024-01-14 DIAGNOSIS — E559 Vitamin D deficiency, unspecified: Secondary | ICD-10-CM

## 2024-01-14 NOTE — Telephone Encounter (Signed)
Copied from CRM [id number]. Topic: General - Other >> Jan 14, 2024 11:03 AM Fredrich Romans wrote: Reason for CRM: patient would like to know if there is a nutritionist that she can be referred to or any nutrition class that she may be able to attend to help her with her nutrition and diet

## 2024-01-17 NOTE — Telephone Encounter (Signed)
Referral placed.

## 2024-01-17 NOTE — Addendum Note (Signed)
Addended by: Kern Reap B on: 01/17/2024 10:48 AM   Modules accepted: Orders

## 2024-02-04 ENCOUNTER — Ambulatory Visit (INDEPENDENT_AMBULATORY_CARE_PROVIDER_SITE_OTHER): Payer: BC Managed Care – PPO

## 2024-02-04 ENCOUNTER — Ambulatory Visit: Payer: BC Managed Care – PPO

## 2024-02-04 DIAGNOSIS — E538 Deficiency of other specified B group vitamins: Secondary | ICD-10-CM

## 2024-02-04 MED ORDER — CYANOCOBALAMIN 1000 MCG/ML IJ SOLN
1000.0000 ug | Freq: Once | INTRAMUSCULAR | Status: AC
Start: 2024-02-04 — End: 2024-02-04
  Administered 2024-02-04: 1000 ug via INTRAMUSCULAR

## 2024-02-04 NOTE — Progress Notes (Signed)
Per orders of Dr. Caryl Never, injection of Cyanocobalamin 1000 mcg given by Vickii Chafe on Left Deltoid. Patient tolerated injection well.

## 2024-02-07 ENCOUNTER — Other Ambulatory Visit: Payer: Self-pay | Admitting: Internal Medicine

## 2024-02-07 DIAGNOSIS — G8929 Other chronic pain: Secondary | ICD-10-CM

## 2024-02-18 ENCOUNTER — Other Ambulatory Visit: Payer: Self-pay | Admitting: Internal Medicine

## 2024-02-18 DIAGNOSIS — I10 Essential (primary) hypertension: Secondary | ICD-10-CM

## 2024-02-28 ENCOUNTER — Telehealth: Payer: Self-pay

## 2024-02-28 NOTE — Telephone Encounter (Signed)
 Copied from CRM [id number]. Topic: Clinical - Prescription Issue >> Feb 28, 2024  1:55 PM Kathryne Eriksson wrote: Reason for CRM: tirzepatide [facility])  Pen >> Feb 28, 2024  1:57 PM Kathryne Eriksson wrote: Patient states the pharmacy filled her tirzepatide [facility]) Pen for 2.5 MG instead of it being 5 MG/0.5ML

## 2024-02-29 NOTE — Telephone Encounter (Signed)
 Patient states that she is currently taking 5 mg.  Medication list updated.

## 2024-02-29 NOTE — Addendum Note (Signed)
 Addended by: Kern Reap B on: 02/29/2024 10:35 AM   Modules accepted: Orders

## 2024-03-02 ENCOUNTER — Encounter: Payer: BC Managed Care – PPO | Attending: Internal Medicine | Admitting: Dietician

## 2024-03-02 ENCOUNTER — Encounter: Payer: Self-pay | Admitting: Dietician

## 2024-03-02 VITALS — Wt 192.0 lb

## 2024-03-02 DIAGNOSIS — E1165 Type 2 diabetes mellitus with hyperglycemia: Secondary | ICD-10-CM | POA: Insufficient documentation
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
## 2024-03-02 NOTE — Patient Instructions (Signed)
 Goals Established by Patient:  Goal 1: Exercise (walk/dance) 3-4 times a week for at least 15 minutes.  Goal 2:  Drink 4 water bottles daily (you can add flavor like crystal light).   Goal 3: Try diet cheerwine and diet lemonade.

## 2024-03-03 ENCOUNTER — Ambulatory Visit: Payer: BC Managed Care – PPO | Admitting: *Deleted

## 2024-03-03 DIAGNOSIS — E538 Deficiency of other specified B group vitamins: Secondary | ICD-10-CM | POA: Diagnosis not present

## 2024-03-03 MED ORDER — CYANOCOBALAMIN 1000 MCG/ML IJ SOLN
1000.0000 ug | Freq: Once | INTRAMUSCULAR | Status: AC
  Administered 2024-03-03: 1000 ug via INTRAMUSCULAR

## 2024-03-03 NOTE — Progress Notes (Signed)
 Per orders of Dr. Casimiro Needle, injection of Cyanocobalamin given by Johnella Moloney. Patient tolerated injection well.

## 2024-03-10 ENCOUNTER — Telehealth: Payer: Self-pay

## 2024-03-10 NOTE — Telephone Encounter (Signed)
 Copied from CRM [id number]. Topic: Clinical - Prescription Issue >> Mar 10, 2024 12:56 PM Adele Barthel wrote: Reason for CRM:   Patient reports when picking up her Mounjaro at [facility] on file, she was given the 2.5 mg dosage instead of 5 mg. She did not realized the mistake until she got home and read the information on the bag. She has contacted [facility] and left a message to see if she can return the medication because she has not opened it yet.  She is calling to confirm she was started on the 5 mg dosage as reported to her by provider. Did verify the prescription was for 5mg  that was sent to the pharmacy on 03/04. Patient would like to confirm she is supposed to start the 5 mg or if she is to continue the 2.5 mg.   CB#   [phone] [id number]

## 2024-03-13 ENCOUNTER — Other Ambulatory Visit: Payer: Self-pay | Admitting: Internal Medicine

## 2024-03-13 DIAGNOSIS — I10 Essential (primary) hypertension: Secondary | ICD-10-CM

## 2024-03-13 NOTE — Telephone Encounter (Signed)
 Patient is aware

## 2024-03-13 NOTE — Telephone Encounter (Signed)
Attempted to call the patient but the voicemail is full.

## 2024-03-18 ENCOUNTER — Other Ambulatory Visit: Payer: Self-pay | Admitting: Internal Medicine

## 2024-03-18 DIAGNOSIS — E559 Vitamin D deficiency, unspecified: Secondary | ICD-10-CM

## 2024-04-06 ENCOUNTER — Ambulatory Visit: Payer: BC Managed Care – PPO

## 2024-04-06 ENCOUNTER — Ambulatory Visit

## 2024-04-10 ENCOUNTER — Ambulatory Visit (INDEPENDENT_AMBULATORY_CARE_PROVIDER_SITE_OTHER)

## 2024-04-10 DIAGNOSIS — E538 Deficiency of other specified B group vitamins: Secondary | ICD-10-CM | POA: Diagnosis not present

## 2024-04-10 MED ORDER — CYANOCOBALAMIN 1000 MCG/ML IJ SOLN
1000.0000 ug | Freq: Once | INTRAMUSCULAR | Status: AC
  Administered 2024-04-10: 1000 ug via INTRAMUSCULAR

## 2024-04-10 NOTE — Progress Notes (Signed)
 Patient is in office today for a nurse visit for B12 Injection. Patient Injection was given in the  Right deltoid. Patient tolerated injection well.

## 2024-04-17 ENCOUNTER — Other Ambulatory Visit: Payer: Self-pay | Admitting: Internal Medicine

## 2024-04-17 DIAGNOSIS — G8929 Other chronic pain: Secondary | ICD-10-CM

## 2024-04-17 DIAGNOSIS — E559 Vitamin D deficiency, unspecified: Secondary | ICD-10-CM

## 2024-05-11 ENCOUNTER — Encounter: Payer: Self-pay | Admitting: Internal Medicine

## 2024-05-11 ENCOUNTER — Ambulatory Visit (INDEPENDENT_AMBULATORY_CARE_PROVIDER_SITE_OTHER): Payer: BC Managed Care – PPO | Admitting: Internal Medicine

## 2024-05-11 DIAGNOSIS — E1169 Type 2 diabetes mellitus with other specified complication: Secondary | ICD-10-CM | POA: Diagnosis not present

## 2024-05-11 DIAGNOSIS — Z7984 Long term (current) use of oral hypoglycemic drugs: Secondary | ICD-10-CM

## 2024-05-11 DIAGNOSIS — E785 Hyperlipidemia, unspecified: Secondary | ICD-10-CM | POA: Diagnosis not present

## 2024-05-11 DIAGNOSIS — I10 Essential (primary) hypertension: Secondary | ICD-10-CM | POA: Diagnosis not present

## 2024-05-11 DIAGNOSIS — J309 Allergic rhinitis, unspecified: Secondary | ICD-10-CM | POA: Diagnosis not present

## 2024-05-11 DIAGNOSIS — Z7985 Long-term (current) use of injectable non-insulin antidiabetic drugs: Secondary | ICD-10-CM | POA: Diagnosis not present

## 2024-05-11 DIAGNOSIS — E538 Deficiency of other specified B group vitamins: Secondary | ICD-10-CM

## 2024-05-11 LAB — POCT GLYCOSYLATED HEMOGLOBIN (HGB A1C): Hemoglobin A1C: 7.4 % — AB (ref 4.0–5.6)

## 2024-05-11 MED ORDER — TIRZEPATIDE 7.5 MG/0.5ML ~~LOC~~ SOAJ: 7.5000 mg | SUBCUTANEOUS | 0 refills | Status: AC

## 2024-05-11 MED ORDER — CYANOCOBALAMIN 1000 MCG/ML IJ SOLN
1000.0000 ug | Freq: Once | INTRAMUSCULAR | Status: AC
  Administered 2024-05-11: 1000 ug via INTRAMUSCULAR

## 2024-05-11 NOTE — Addendum Note (Signed)
 Addended by: Nicolina Barrios B on: 05/11/2024 10:44 AM   Modules accepted: Orders

## 2024-05-11 NOTE — Progress Notes (Signed)
 Established Patient Office Visit     CC/Reason for Visit: Follow-up chronic medical conditions  HPI: Katie Morse is a 58 y.o. female who is coming in today for the above mentioned reasons. Past Medical History is significant for: Hypertension, hyperlipidemia, type 2 diabetes, obesity, vitamin D  and B12 deficiency.  She has been tolerating increase of Mounjaro  dosing.  She has lost 8 pounds since her last visit.  She is also taking Farxiga .  She is requesting B12 injection.   Past Medical/Surgical History: Past Medical History:  Diagnosis Date   ALLERGIC RHINITIS 08/20/2008   Allergy    Anemia    Arthritis    Blood transfusion without reported diagnosis 2011   post surgery   Diabetes mellitus without complication (HCC)    DYSPNEA 11/12/2008   Dysrhythmia    a. fib   Headache(784.0) 08/20/2008   HYPERTENSION 08/20/2008   LEIOMYOMA, UTERUS 08/20/2008   PALPITATIONS, RECURRENT 11/12/2008   Sickle-cell trait (HCC) 08/20/2008   SYSTOLIC MURMUR 11/12/2008    Past Surgical History:  Procedure Laterality Date   LIPOMA EXCISION Left 07/19/2019   Procedure: EXCISION OF SUBCUTANEOUS LIPOMA LEFT BUTTOCK;  Surgeon: Dareen Ebbing, MD;  Location: [facility];  Service: General;  Laterality: Left;   MYOMECTOMY     fibroids   SHOULDER ARTHROSCOPY WITH OPEN ROTATOR CUFF REPAIR AND DISTAL CLAVICLE ACROMINECTOMY Right 08/13/2022   Procedure: right shoulder arthroscopy, debridement, biceps tenodesis, mini open rotator cuff tear repair;  Surgeon: Jasmine Mesi, MD;  Location: [facility] OR;  Service: Orthopedics;  Laterality: Right;    Social History:  reports that she has never smoked. She has never used smokeless tobacco. She reports current alcohol use. She reports that she does not use drugs.  Allergies: Allergies  Allergen Reactions   Latex Rash   Wild Lettuce Extract (Lactuca Virosa) Other (See Comments)    Gi -upset Other reaction(s): Other Gi  -upset   Avocado Nausea Only   Lactose Intolerance (Gi)     Gi-upset   Metformin  Hcl Diarrhea   Other Other (See Comments)    Andee Kato /Gi upset   Watermelon Flavoring Agent (Non-Screening)     Gi upset    Family History:  Family History  Problem Relation Age of Onset   Hypertension Mother    Heart Problems Mother    Cancer Father        lung and prostate ca   Diabetes Other    Breast cancer Cousin        2-twins   Colon cancer Neg Hx      Current Outpatient Medications:    tirzepatide  (MOUNJARO ) 7.5 MG/0.5ML Pen, Inject 7.5 mg into the skin once a week., Disp: 6 mL, Rfl: 0   acetaminophen  (TYLENOL ) 650 MG CR tablet, Take 650-1,300 mg by mouth every 8 (eight) hours as needed for pain., Disp: , Rfl:    amLODipine  (NORVASC ) 5 MG tablet, Take 1 tablet (5 mg total) by mouth daily., Disp: 90 tablet, Rfl: 1   ASPIRIN  LOW DOSE 81 MG tablet, TAKE 1 TABLET BY MOUTH EVERY DAY, Disp: 30 tablet, Rfl: 11   Aspirin -Caffeine (BAYER BACK & BODY) 500-32.5 MG TABS, Take 2 tablets by mouth at bedtime as needed (Pain)., Disp: , Rfl:    atorvastatin  (LIPITOR) 40 MG tablet, TAKE 1 TABLET BY MOUTH EVERY DAY, Disp: 90 tablet, Rfl: 1   atorvastatin  (LIPITOR) 80 MG tablet, Take 1 tablet (80 mg total) by mouth at bedtime., Disp: 90 tablet,  Rfl: 1   Cranberry 250 MG CAPS, Orally, Disp: , Rfl:    Cyanocobalamin  (VITAMIN B-12 IJ), Inject 1 mL as directed every 30 (thirty) days., Disp: , Rfl:    cyclobenzaprine  (FLEXERIL ) 5 MG tablet, TAKE 1 TABLET BY MOUTH AT BEDTIME AS NEEDED FOR MUSCLE SPASMS., Disp: 30 tablet, Rfl: 1   dapagliflozin  propanediol (FARXIGA ) 5 MG TABS tablet, Take 1 tablet (5 mg total) by mouth daily before breakfast., Disp: 90 tablet, Rfl: 1   diclofenac  (VOLTAREN ) 75 MG EC tablet, TAKE 1 TABLET BY MOUTH TWICE A DAY, Disp: 60 tablet, Rfl: 0   diclofenac  Sodium (VOLTAREN ) 1 % GEL, Apply 2 g topically daily as needed (arm pain)., Disp: , Rfl:    ezetimibe  (ZETIA ) 10 MG tablet, TAKE 1 TABLET  BY MOUTH EVERY DAY, Disp: 90 tablet, Rfl: 1   fluticasone (FLONASE) 50 MCG/ACT nasal spray, Place 2 sprays into both nostrils daily as needed for allergies., Disp: , Rfl:    fluticasone (FLONASE) 50 MCG/ACT nasal spray, Place into the nose., Disp: , Rfl:    hydrochlorothiazide  (HYDRODIURIL ) 25 MG tablet, Take 1 tablet (25 mg total) by mouth daily., Disp: 90 tablet, Rfl: 1   HYDROcodone -acetaminophen  (NORCO) 5-325 MG tablet, Take 1 tablet by mouth every 6 (six) hours as needed for moderate pain., Disp: 35 tablet, Rfl: 0   hydrocortisone  2.5 % cream, Apply topically 2 (two) times daily., Disp: 30 g, Rfl: 0   ketorolac (ACULAR) 0.5 % ophthalmic solution, SMARTSIG:In Eye(s), Disp: , Rfl:    losartan  (COZAAR ) 100 MG tablet, TAKE 1 TABLET BY MOUTH EVERY DAY, Disp: 90 tablet, Rfl: 1   metoprolol  tartrate (LOPRESSOR ) 25 MG tablet, TAKE 1 TABLET BY MOUTH TWICE A DAY, Disp: 180 tablet, Rfl: 1   moxifloxacin (VIGAMOX) 0.5 % ophthalmic solution, Apply to eye., Disp: , Rfl:    Multiple Vitamins-Minerals (HAIR/SKIN/NAILS/BIOTIN PO), Orally, Disp: , Rfl:    nitroGLYCERIN  (NITROSTAT ) 0.4 MG SL tablet, PLACE 1 TABLET UNDER THE TONGUE EVERY 5 MINUTES AS NEEDED FOR CHEST PAIN., Disp: 25 tablet, Rfl: 2   Omega-3 Fatty Acids (FISH OIL) 500 MG CAPS, 1 capsule Orally Twice a day, Disp: , Rfl:    Polyvinyl Alcohol-Povidone PF (REFRESH) 1.4-0.6 % SOLN, Place 1 drop into both eyes in the morning, at noon, in the evening, and at bedtime., Disp: , Rfl:    POTASSIUM PO, Potassium, Disp: , Rfl:    prednisoLONE acetate (PRED FORTE) 1 % ophthalmic suspension, one drop 4 (four) times daily., Disp: , Rfl:    Prenatal Vit-Fe Fumarate-FA (PRENATAL MULTIVITAMIN) TABS tablet, Take 1 tablet by mouth daily at 12 noon., Disp: , Rfl:    trimethoprim -polymyxin b  (POLYTRIM ) ophthalmic solution, Place 2 drops into both eyes every 4 (four) hours., Disp: 10 mL, Rfl: 0   VENTOLIN  HFA 108 (90 Base) MCG/ACT inhaler, INHALE 1 PUFF INTO THE LUNGS  EVERY 6 HOURS AS NEEDED FOR WHEEZING OR SHORTNESS OF BREATH., Disp: 18 each, Rfl: 3   vitamin C (ASCORBIC ACID) 500 MG tablet, Take 500 mg by mouth daily., Disp: , Rfl:    vitamin E 200 UNIT capsule, one capsule (200 Units dose)., Disp: , Rfl:   Review of Systems:  Negative unless indicated in HPI.   Physical Exam: There were no vitals filed for this visit.  There is no height or weight on file to calculate BMI.   Physical Exam Vitals reviewed.  Constitutional:      Appearance: Normal appearance.  HENT:     Head:  Normocephalic and atraumatic.  Eyes:     Conjunctiva/sclera: Conjunctivae normal.     Pupils: Pupils are equal, round, and reactive to light.  Cardiovascular:     Rate and Rhythm: Normal rate and regular rhythm.  Pulmonary:     Effort: Pulmonary effort is normal.     Breath sounds: Normal breath sounds.  Skin:    General: Skin is warm and dry.  Neurological:     General: No focal deficit present.     Mental Status: She is alert and oriented to person, place, and time.  Psychiatric:        Mood and Affect: Mood normal.        Behavior: Behavior normal.        Thought Content: Thought content normal.        Judgment: Judgment normal.      Impression and Plan:  Type 2 diabetes mellitus with other specified complication, without long-term current use of insulin  (HCC) -     POCT glycosylated hemoglobin (Hb A1C) -     Tirzepatide ; Inject 7.5 mg into the skin once a week.  Dispense: 6 mL; Refill: 0  Allergic rhinitis, unspecified seasonality, unspecified trigger -     Ambulatory referral to Allergy  Hyperlipidemia associated with type 2 diabetes mellitus (HCC)  Essential hypertension  Morbid obesity (HCC)  Vitamin B12 deficiency  -B12 injection given in office today. - Referral to allergy given seasonal allergies per request. - Blood pressure is well-controlled on current. - A1c shows significant improvement from 11.6 down to 7.4.  Increase Mounjaro ,  continue Farxiga . - Lipids were above goal.  She is on maximum dose atorvastatin  and ezetimibe .  She is waiting on an appointment to the lipid clinic.   Time spent:31 minutes reviewing chart, interviewing and examining patient and formulating plan of care.     Marguerita Shih, MD [facility]

## 2024-05-12 ENCOUNTER — Other Ambulatory Visit: Payer: Self-pay | Admitting: Internal Medicine

## 2024-05-12 DIAGNOSIS — E559 Vitamin D deficiency, unspecified: Secondary | ICD-10-CM

## 2024-05-21 ENCOUNTER — Other Ambulatory Visit: Payer: Self-pay | Admitting: Internal Medicine

## 2024-05-21 DIAGNOSIS — I10 Essential (primary) hypertension: Secondary | ICD-10-CM

## 2024-05-31 ENCOUNTER — Other Ambulatory Visit: Payer: Self-pay | Admitting: Internal Medicine

## 2024-05-31 DIAGNOSIS — I10 Essential (primary) hypertension: Secondary | ICD-10-CM

## 2024-06-08 ENCOUNTER — Encounter: Payer: Self-pay | Admitting: Dietician

## 2024-06-08 ENCOUNTER — Encounter: Attending: Internal Medicine | Admitting: Dietician

## 2024-06-08 VITALS — Wt 189.7 lb

## 2024-06-08 DIAGNOSIS — E1169 Type 2 diabetes mellitus with other specified complication: Secondary | ICD-10-CM | POA: Insufficient documentation

## 2024-06-08 NOTE — Progress Notes (Signed)
 Diabetes Self-Management Education  Visit Type: Follow-up  Appt. Start Time: 1000 Appt. End Time: 1030  06/08/2024  [name], identified by name and date of birth, is a 58 y.o. female with a diagnosis of Diabetes: type 2  .   ASSESSMENT  History includes: type 2 diabetes, allergies, anemia, arthritis, heart murmur, HTN Labs noted: A1c 7.4% on 05/11/24 Medications include: reviewed, farxiga , mounjaro  Supplements: vitamin b12, cranberry, MVI, prenatal, omega 3, vitamin C, vitamin D , vitamin E.   Wt Readings from Last 3 Encounters:  06/08/24 189 lb 11.2 oz (86 kg)  03/02/24 192 lb (87.1 kg)  01/06/24 186 lb 14.4 oz (84.8 kg)   A1c down from 11.6 on 01/06/24, to 7.4% on 05/11/24.   Pt reports she has been making some changes to her diet. Pt states she did a whole month without meat or cheerwine. Pt reports she tried diet cheerwine and did not like it. Pt reports still drinking 20 oz cheerwine most days.   Pt states she continues with medications.   Pt reports getting up at 5am for work but not eating until 1pm when she gets off.   Pt states she has been busy with her kids football and cheer practices. Pt reports she has not started exercise but football practice has a track around it and pt wants to set a goal to walk on the track.    Assessment of Previous Goals Established by Patient:   Goal 1: Exercise (walk/dance) 3-4 times a week for at least 15 minutes. - goal not met, changed goal.    Goal 2:  Drink 4 water bottles daily (you can add flavor like crystal light). - goal in progress, continue.    Goal 3: Try diet cheerwine and diet lemonade. - tried it, did not like, changed goal to reducing cheerwine.   New Goals listed under pt instructions.   Weight 189 lb 11.2 oz (86 kg), last menstrual period 07/04/2014. Body mass index is 34.7 kg/m.   Diabetes Self-Management Education - 06/08/24 0959       Visit Information   Visit Type Follow-up      Health  Coping   How would you rate your overall health? Good      Psychosocial Assessment   Patient Belief/Attitude about Diabetes Motivated to manage diabetes    What is the hardest part about your diabetes right now, causing you the most concern, or is the most worrisome to you about your diabetes?   Making healty food and beverage choices    Self-care barriers None    Self-management support Doctor's office    Other persons present Patient    Patient Concerns Nutrition/Meal planning    Special Needs None    Preferred Learning Style No preference indicated    Learning Readiness Ready      Pre-Education Assessment   Patient understands the diabetes disease and treatment process. Needs Review    Patient understands incorporating nutritional management into lifestyle. Needs Review    Patient undertands incorporating physical activity into lifestyle. Needs Review    Patient understands using medications safely. Needs Review    Patient understands monitoring blood glucose, interpreting and using results Needs Review    Patient understands prevention, detection, and treatment of acute complications. Needs Review    Patient understands prevention, detection, and treatment of chronic complications. Needs Review    Patient understands how to develop strategies to address psychosocial issues. Needs Review    Patient understands how to develop strategies  to promote health/change behavior. Needs Review      Complications   Last HgB A1C per patient/outside source 7.4 %    How often do you check your blood sugar? 0 times/day (not testing)      Dietary Intake   Breakfast none    Snack (morning) none    Lunch 12pm: steak and cheese biscuit    Snack (afternoon) 3pm: salad with veggies and vinaigrette    Dinner 6pm: cabbage and greens, mac and cheese and baked chicken    Snack (evening) none    Beverage(s) 1 c coffee, 20 oz cheerwine, 10 oz orange juice, 1L water      Activity / Exercise   Activity /  Exercise Type ADL's      Patient Education   Previous Diabetes Education Yes (please comment)    Disease Pathophysiology Explored patient's options for treatment of their diabetes;Factors that contribute to the development of diabetes    Healthy Eating Role of diet in the treatment of diabetes and the relationship between the three main macronutrients and blood glucose level;Plate Method;Information on hints to eating out and maintain blood glucose control.;Meal options for control of blood glucose level and chronic complications.;Meal timing in regards to the patients' current diabetes medication.    Being Active Role of exercise on diabetes management, blood pressure control and cardiac health.;Helped patient identify appropriate exercises in relation to his/her diabetes, diabetes complications and other health issue.    Medications Reviewed patients medication for diabetes, action, purpose, timing of dose and side effects.    Monitoring Purpose and frequency of SMBG.    Chronic complications Relationship between chronic complications and blood glucose control;Identified and discussed with patient  current chronic complications    Diabetes Stress and Support Identified and addressed patients feelings and concerns about diabetes;Worked with patient to identify barriers to care and solutions    Lifestyle and Health Coping Lifestyle issues that need to be addressed for better diabetes care      Individualized Goals (developed by patient)   Nutrition General guidelines for healthy choices and portions discussed    Physical Activity Exercise 3-5 times per week;30 minutes per day    Medications take my medication as prescribed    Monitoring  Test my blood glucose as discussed    Problem Solving Eating Pattern    Reducing Risk examine blood glucose patterns;do foot checks daily;treat hypoglycemia with 15 grams of carbs if blood glucose less than 70mg /dL    Health Coping Ask for help with  psychological, social, or emotional issues      Patient Self-Evaluation of Goals - Patient rates self as meeting previously set goals (% of time)   Nutrition 50 - 75 % (half of the time)    Physical Activity < 25% (hardly ever/never)    Medications >75% (most of the time)    Monitoring < 25% (hardly ever/never)    Problem Solving and behavior change strategies  50 - 75 % (half of the time)    Reducing Risk (treating acute and chronic complications) 50 - 75 % (half of the time)    Health Coping 50 - 75 % (half of the time)      Post-Education Assessment   Patient understands the diabetes disease and treatment process. Comprehends key points    Patient understands incorporating nutritional management into lifestyle. Comprehends key points    Patient undertands incorporating physical activity into lifestyle. Comprehends key points    Patient understands using medications safely. Comphrehends  key points    Patient understands monitoring blood glucose, interpreting and using results Comprehends key points    Patient understands prevention, detection, and treatment of acute complications. Comprehends key points    Patient understands prevention, detection, and treatment of chronic complications. Comprehends key points    Patient understands how to develop strategies to address psychosocial issues. Comprehends key points    Patient understands how to develop strategies to promote health/change behavior. Comprehends key points      Outcomes   Expected Outcomes Demonstrated interest in learning. Expect positive outcomes    Future DMSE PRN    Program Status Completed      Subsequent Visit   Since your last visit have you continued or begun to take your medications as prescribed? Yes    Since your last visit, are you checking your blood glucose at least once a day? No          Individualized Plan for Diabetes Self-Management Training:   Learning Objective:  Patient will have a greater  understanding of diabetes self-management. Patient education plan is to attend individual and/or group sessions per assessed needs and concerns.   Plan:   Patient Instructions  New Goals:   Goal 1: reduce cheerwine to 1 small cup.   Goal 2: walk the track for 20 minutes when your kid is at football practice.   Expected Outcomes:  Demonstrated interest in learning. Expect positive outcomes  Education material provided: no handouts on this follow up  If problems or questions, patient to contact team via:  Phone  Future DSME appointment: PRN

## 2024-06-08 NOTE — Patient Instructions (Signed)
 New Goals:   Goal 1: reduce cheerwine to 1 small cup.   Goal 2: walk the track for 20 minutes when your kid is at football practice.

## 2024-06-15 ENCOUNTER — Ambulatory Visit: Admitting: *Deleted

## 2024-06-15 DIAGNOSIS — E538 Deficiency of other specified B group vitamins: Secondary | ICD-10-CM

## 2024-06-15 MED ORDER — CYANOCOBALAMIN 1000 MCG/ML IJ SOLN
1000.0000 ug | Freq: Once | INTRAMUSCULAR | Status: AC
Start: 2024-06-15 — End: 2024-06-15
  Administered 2024-06-15: 1000 ug via INTRAMUSCULAR

## 2024-06-15 NOTE — Progress Notes (Signed)
 Per orders of Dr. Ardyth Harps, injection of B12 given by Kern Reap. Patient tolerated injection well.

## 2024-06-22 ENCOUNTER — Other Ambulatory Visit: Payer: Self-pay | Admitting: Internal Medicine

## 2024-06-22 ENCOUNTER — Ambulatory Visit (INDEPENDENT_AMBULATORY_CARE_PROVIDER_SITE_OTHER): Admitting: Obstetrics and Gynecology

## 2024-06-22 ENCOUNTER — Encounter: Payer: Self-pay | Admitting: Obstetrics and Gynecology

## 2024-06-22 VITALS — BP 131/84 | HR 71 | Ht 62.5 in | Wt 186.0 lb

## 2024-06-22 DIAGNOSIS — G8929 Other chronic pain: Secondary | ICD-10-CM

## 2024-06-22 DIAGNOSIS — Z01419 Encounter for gynecological examination (general) (routine) without abnormal findings: Secondary | ICD-10-CM

## 2024-06-22 NOTE — Patient Instructions (Signed)
 It was nice meeting you today! Please let us  know if you have any questions or concerns after your visit.

## 2024-06-22 NOTE — Progress Notes (Signed)
 ANNUAL EXAM Patient name: Katie Morse MRN [id number]  Date of birth: [birth year] Chief Complaint:   Gynecologic Exam  History of Present Illness:   Katie Morse is a 58 y.o. menopausal [id number] with Patient's last menstrual period was 07/04/2014. being seen today for a routine annual exam.  Current complaints: None. No PMB  Last pap 2023. Results were: normal per pt report with Dr Rutherford. H/O abnormal pap: no Last mammogram: 2023. Results were: BIRADS1.  Last colonoscopy: 2017. Results were: normal     06/22/2024    9:45 AM 03/02/2024   10:12 AM 01/06/2024    3:47 PM 04/29/2023   11:24 AM 12/31/2022   11:16 AM  Depression screen PHQ 2/9  Decreased Interest 0 0 0 0 0  Down, Depressed, Hopeless 0 0 0 0 0  PHQ - 2 Score 0 0 0 0 0  Altered sleeping 1  2 2  0  Tired, decreased energy 1  3 3  0  Change in appetite 0  1 1 0  Feeling bad or failure about yourself  0  0 0 0  Trouble concentrating 0  0 0 0  Moving slowly or fidgety/restless 0  0 0 0  Suicidal thoughts 0  0 0 0  PHQ-9 Score 2  6 6  0  Difficult doing work/chores    Very difficult Not difficult at all        06/22/2024    9:45 AM 04/29/2023   11:24 AM  GAD 7 : Generalized Anxiety Score  Nervous, Anxious, on Edge 0 0  Control/stop worrying 0 0  Worry too much - different things 0 0  Trouble relaxing 1 1  Restless 0 0  Easily annoyed or irritable 0 1  Afraid - awful might happen 0 0  Total GAD 7 Score 1 2  Anxiety Difficulty  Somewhat difficult     Review of Systems:   Pertinent items are noted in HPI Denies any headaches, blurred vision, fatigue, shortness of breath, chest pain, abdominal pain, abnormal vaginal discharge/itching/odor/irritation, problems with periods, bowel movements, urination, or intercourse unless otherwise stated above. Pertinent History Reviewed:  Reviewed past medical,surgical, social and family history.  Reviewed problem list, medications and allergies. Physical  Assessment:   Vitals:   06/22/24 0935 06/22/24 0944  BP: (!) 160/90 131/84  Pulse: 80 71  Weight: 186 lb (84.4 kg)   Height: 5' 2.5 (1.588 m)   Body mass index is 33.48 kg/m.        Physical Examination:   General appearance - well appearing, and in no distress  Mental status - alert, oriented to person, place, and time  Chest - respiratory effort normal  Heart - normal peripheral perfusion  Breasts - breasts appear normal, no suspicious masses, no skin or nipple changes or axillary nodes  Abdomen - soft, nontender, nondistended, no masses or organomegaly  Pelvic - VULVA: normal appearing vulva with no masses, tenderness or lesions CERVIX: normal texture, no mass or tenderness  UTERUS: uterus difficult to palpate due to habitus  ADNEXA: No adnexal masses or tenderness noted.  Chaperone present for exam  No results found for this or any previous visit (from the past 24 hours).  Assessment & Plan:  1) Well-Woman Exam Mammogram: schedule screening mammo as soon as possible Colonoscopy: due 2027 Pap: Sign ROI today for pap records GC/CT/HIV/HCV: declines Hx fibroids - no active concerns  Follow-up: Return in about 1 year (around 06/22/2025) for annual exam or sooner as needed.  Kieth JAYSON Carolin, MD 06/22/2024 10:14 AM

## 2024-06-28 ENCOUNTER — Other Ambulatory Visit: Payer: Self-pay | Admitting: Internal Medicine

## 2024-06-28 DIAGNOSIS — E559 Vitamin D deficiency, unspecified: Secondary | ICD-10-CM

## 2024-06-28 DIAGNOSIS — I10 Essential (primary) hypertension: Secondary | ICD-10-CM

## 2024-06-30 ENCOUNTER — Telehealth: Payer: Self-pay

## 2024-06-30 NOTE — Telephone Encounter (Signed)
 Copied from CRM [id number]. Topic: General - Other >> Jun 30, 2024  4:01 PM Rosina BIRCH wrote: Reason for CRM: chuyi from [employer] called stating they will be sending over a fax to the office regarding the patient

## 2024-07-03 NOTE — Telephone Encounter (Signed)
 Noted

## 2024-07-05 ENCOUNTER — Ambulatory Visit

## 2024-07-06 ENCOUNTER — Ambulatory Visit
Admission: RE | Admit: 2024-07-06 | Discharge: 2024-07-06 | Disposition: A | Discharge status: Home / Self Care | Source: Ambulatory Visit | Attending: Internal Medicine | Admitting: Internal Medicine

## 2024-07-06 DIAGNOSIS — Z1231 Encounter for screening mammogram for malignant neoplasm of breast: Secondary | ICD-10-CM

## 2024-07-13 ENCOUNTER — Other Ambulatory Visit

## 2024-07-13 ENCOUNTER — Other Ambulatory Visit (INDEPENDENT_AMBULATORY_CARE_PROVIDER_SITE_OTHER): Payer: BC Managed Care – PPO

## 2024-07-13 DIAGNOSIS — E785 Hyperlipidemia, unspecified: Secondary | ICD-10-CM | POA: Diagnosis not present

## 2024-07-13 DIAGNOSIS — E559 Vitamin D deficiency, unspecified: Secondary | ICD-10-CM | POA: Diagnosis not present

## 2024-07-13 DIAGNOSIS — E1169 Type 2 diabetes mellitus with other specified complication: Secondary | ICD-10-CM

## 2024-07-13 LAB — LIPID PANEL
Cholesterol: 185 mg/dL (ref 0–200)
HDL: 69.8 mg/dL (ref >39.00)
LDL Cholesterol: 103 mg/dL — ABNORMAL HIGH (ref 0–99)
NonHDL: 114.95
Total CHOL/HDL Ratio: 3
Triglycerides: 58 mg/dL (ref 0.0–149.0)
VLDL: 11.6 mg/dL (ref 0.0–40.0)

## 2024-07-13 LAB — VITAMIN D 25 HYDROXY (VIT D DEFICIENCY, FRACTURES): VITD: 29.92 ng/mL — ABNORMAL LOW (ref 30.00–100.00)

## 2024-07-17 ENCOUNTER — Ambulatory Visit (INDEPENDENT_AMBULATORY_CARE_PROVIDER_SITE_OTHER): Admitting: *Deleted

## 2024-07-17 DIAGNOSIS — E538 Deficiency of other specified B group vitamins: Secondary | ICD-10-CM | POA: Diagnosis not present

## 2024-07-17 MED ORDER — CYANOCOBALAMIN 1000 MCG/ML IJ SOLN
1000.0000 ug | Freq: Once | INTRAMUSCULAR | Status: AC
  Administered 2024-07-17: 1000 ug via INTRAMUSCULAR

## 2024-07-17 NOTE — Progress Notes (Signed)
Per orders of Dr. Legrand Como, injection of B12 given by Westley Hummer. Patient tolerated injection well.

## 2024-07-18 ENCOUNTER — Ambulatory Visit: Payer: Self-pay | Admitting: Internal Medicine

## 2024-07-18 DIAGNOSIS — E559 Vitamin D deficiency, unspecified: Secondary | ICD-10-CM

## 2024-07-18 DIAGNOSIS — E1169 Type 2 diabetes mellitus with other specified complication: Secondary | ICD-10-CM

## 2024-07-18 MED ORDER — VITAMIN D (ERGOCALCIFEROL) 1.25 MG (50000 UNIT) PO CAPS: 50000.0000 [IU] | ORAL_CAPSULE | ORAL | 0 refills | Status: AC

## 2024-07-19 ENCOUNTER — Ambulatory Visit (HOSPITAL_BASED_OUTPATIENT_CLINIC_OR_DEPARTMENT_OTHER): Admitting: Internal Medicine

## 2024-07-19 ENCOUNTER — Encounter (HOSPITAL_BASED_OUTPATIENT_CLINIC_OR_DEPARTMENT_OTHER): Payer: Self-pay

## 2024-07-19 ENCOUNTER — Encounter (HOSPITAL_BASED_OUTPATIENT_CLINIC_OR_DEPARTMENT_OTHER): Payer: Self-pay | Admitting: Internal Medicine

## 2024-07-19 VITALS — BP 128/82 | HR 67 | Ht 63.0 in | Wt 186.0 lb

## 2024-07-19 DIAGNOSIS — E785 Hyperlipidemia, unspecified: Secondary | ICD-10-CM | POA: Diagnosis not present

## 2024-07-19 DIAGNOSIS — E1169 Type 2 diabetes mellitus with other specified complication: Secondary | ICD-10-CM

## 2024-07-19 NOTE — Patient Instructions (Signed)
 Medication Instructions:  Your physician recommends that you continue on your current medications as directed. Please refer to the Current Medication list given to you today.  *If you need a refill on your cardiac medications before your next appointment, please call your pharmacy*  Lab Work: FASTING NMR and LPa in 6 months  If you have labs (blood work) drawn today and your tests are completely normal, you will receive your results only by: MyChart Message (if you have MyChart) OR A paper copy in the mail If you have any lab test that is abnormal or we need to change your treatment, we will call you to review the results.  Follow-Up: At [facility], you and your health needs are our priority.  As part of our continuing mission to provide you with exceptional heart care, our providers are all part of one team.  This team includes your primary Cardiologist (physician) and Advanced Practice Providers or APPs (Physician Assistants and Nurse Practitioners) who all work together to provide you with the care you need, when you need it.  Your next appointment:   TBD based on lab results

## 2024-07-19 NOTE — Progress Notes (Signed)
 LIPID CLINIC CONSULT NOTE  Chief Complaint:  Manage dyslipidemia  Primary Care Physician: Theophilus Andrews, Tully GRADE, MD  Primary Cardiologist:  None  HPI:  Katie Morse is a 58 y.o. female who is being seen today for the evaluation of dyslipidemia at the request of Theophilus Andrews, Jonna*. This is a pleasant 58 year old female kindly referred for evaluation management of dyslipidemia.  She has a history of high cholesterol in the past but has generally been well-managed.  She has been on both atorvastatin  and ezetimibe  with LDL a year ago of 52 however recent labs have come back with an increase in her cholesterol with total now 185, HDL 58, triglycerides 69 and LDL 103.  She does report compliance of both the atorvastatin  and ezetimibe .  Surprisingly her recent A1c was 7.4% however that did come down from greater than 11% suggesting positive dietary and lifestyle changes.  It is therefore hard to understand why her cholesterol went up unless she is eating more saturated fats to somehow compensate for decreasing carbohydrates.  Activity levels may be less as well.  As she has a history of diabetes, her target LDL is less than 70.  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  PMHx:  Past Medical History:  Diagnosis Date   ALLERGIC RHINITIS 08/20/2008   Allergy    Anemia    Arthritis    Blood transfusion without reported diagnosis 2011   post surgery   Diabetes mellitus without complication (HCC)    DYSPNEA 11/12/2008   Dysrhythmia    a. fib   Headache(784.0) 08/20/2008   HYPERTENSION 08/20/2008   LEIOMYOMA, UTERUS 08/20/2008   PALPITATIONS, RECURRENT 11/12/2008   Sickle-cell trait (HCC) 08/20/2008   SYSTOLIC MURMUR 11/12/2008    Past Surgical History:  Procedure Laterality Date   LIPOMA EXCISION Left 07/19/2019   Procedure: EXCISION OF  SUBCUTANEOUS LIPOMA LEFT BUTTOCK;  Surgeon: Belinda Cough, MD;  Location: [facility];  Service: General;  Laterality: Left;   MYOMECTOMY     fibroids   SHOULDER ARTHROSCOPY WITH OPEN ROTATOR CUFF REPAIR AND DISTAL CLAVICLE ACROMINECTOMY Right 08/13/2022   Procedure: right shoulder arthroscopy, debridement, biceps tenodesis, mini open rotator cuff tear repair;  Surgeon: Addie Cordella Hamilton, MD;  Location: [facility] OR;  Service: Orthopedics;  Laterality: Right;    FAMHx:  Family History  Problem Relation Age of Onset   Hypertension Mother    Heart Problems Mother    Cancer Father        lung and prostate ca   Diabetes Other    Breast cancer Cousin        2-twins   Colon cancer Neg Hx     SOCHx:   reports that she has never smoked. She has never used smokeless tobacco. She reports current alcohol use. She reports that she does not use drugs.  ALLERGIES:  Allergies  Allergen Reactions   Latex Rash   Wild Lettuce Extract (Lactuca Virosa) Other (See Comments)    Gi -upset Other reaction(s): Other Gi -upset   Avocado Nausea Only   Lactose Intolerance (Gi)     Gi-upset   Metformin  Hcl Diarrhea   Other Other (See Comments)    Elner /Gi upset   Watermelon Flavoring Agent (Non-Screening)     Gi upset    ROS: Pertinent items noted in HPI and remainder of comprehensive ROS  otherwise negative.  HOME MEDS: Current Outpatient Medications on File Prior to Visit  Medication Sig Dispense Refill   acetaminophen  (TYLENOL ) 650 MG CR tablet Take 650-1,300 mg by mouth every 8 (eight) hours as needed for pain.     amLODipine  (NORVASC ) 5 MG tablet TAKE 1 TABLET (5 MG TOTAL) BY MOUTH DAILY. 90 tablet 1   ASPIRIN  LOW DOSE 81 MG tablet TAKE 1 TABLET BY MOUTH EVERY DAY 90 tablet 1   Aspirin -Caffeine (BAYER BACK & BODY) 500-32.5 MG TABS Take 2 tablets by mouth at bedtime as needed (Pain).     atorvastatin  (LIPITOR) 80 MG tablet Take 1 tablet (80 mg total) by mouth at bedtime. 90  tablet 1   Cranberry 250 MG CAPS Orally     Cyanocobalamin  (VITAMIN B-12 IJ) Inject 1 mL as directed every 30 (thirty) days.     cyclobenzaprine  (FLEXERIL ) 5 MG tablet TAKE 1 TABLET BY MOUTH AT BEDTIME AS NEEDED FOR MUSCLE SPASMS. 30 tablet 1   dapagliflozin  propanediol (FARXIGA ) 5 MG TABS tablet Take 1 tablet (5 mg total) by mouth daily before breakfast. 90 tablet 1   diclofenac  (VOLTAREN ) 75 MG EC tablet TAKE 1 TABLET BY MOUTH TWICE A DAY 60 tablet 0   diclofenac  Sodium (VOLTAREN ) 1 % GEL Apply 2 g topically daily as needed (arm pain).     ezetimibe  (ZETIA ) 10 MG tablet TAKE 1 TABLET BY MOUTH EVERY DAY 90 tablet 1   fluticasone (FLONASE) 50 MCG/ACT nasal spray Place 2 sprays into both nostrils daily as needed for allergies.     fluticasone (FLONASE) 50 MCG/ACT nasal spray Place into the nose.     HYDROcodone -acetaminophen  (NORCO) 5-325 MG tablet Take 1 tablet by mouth every 6 (six) hours as needed for moderate pain. 35 tablet 0   hydrocortisone  2.5 % cream Apply topically 2 (two) times daily. 30 g 0   ketorolac (ACULAR) 0.5 % ophthalmic solution SMARTSIG:In Eye(s)     losartan -hydrochlorothiazide  (HYZAAR) 100-12.5 MG tablet Take 1 tablet by mouth every morning.     metoprolol  tartrate (LOPRESSOR ) 25 MG tablet TAKE 1 TABLET BY MOUTH TWICE A DAY 60 tablet 5   moxifloxacin (VIGAMOX) 0.5 % ophthalmic solution Apply to eye.     Multiple Vitamins-Minerals (HAIR/SKIN/NAILS/BIOTIN PO) Orally     nitroGLYCERIN  (NITROSTAT ) 0.4 MG SL tablet PLACE 1 TABLET UNDER THE TONGUE EVERY 5 MINUTES AS NEEDED FOR CHEST PAIN. 25 tablet 2   Omega-3 Fatty Acids (FISH OIL) 500 MG CAPS 1 capsule Orally Twice a day     Polyvinyl Alcohol-Povidone PF (REFRESH) 1.4-0.6 % SOLN Place 1 drop into both eyes in the morning, at noon, in the evening, and at bedtime.     POTASSIUM PO Potassium     prednisoLONE acetate (PRED FORTE) 1 % ophthalmic suspension one drop 4 (four) times daily.     Prenatal Vit-Fe Fumarate-FA (PRENATAL  MULTIVITAMIN) TABS tablet Take 1 tablet by mouth daily at 12 noon.     PRESCRIPTION MEDICATION every 30 (thirty) days. Vitamin  B12 injection     tirzepatide  (MOUNJARO ) 7.5 MG/0.5ML Pen Inject 7.5 mg into the skin once a week. 6 mL 0   trimethoprim -polymyxin b  (POLYTRIM ) ophthalmic solution Place 2 drops into both eyes every 4 (four) hours. 10 mL 0   VENTOLIN  HFA 108 (90 Base) MCG/ACT inhaler INHALE 1 PUFF INTO THE LUNGS EVERY 6 HOURS AS NEEDED FOR WHEEZING OR SHORTNESS OF BREATH. 18 each 3   vitamin C (ASCORBIC ACID) 500 MG tablet Take 500  mg by mouth daily.     Vitamin D , Ergocalciferol , (DRISDOL ) 1.25 MG (50000 UNIT) CAPS capsule Take 1 capsule (50,000 Units total) by mouth every 7 (seven) days for 12 doses. 12 capsule 0   vitamin E 200 UNIT capsule one capsule (200 Units dose).     losartan  (COZAAR ) 100 MG tablet TAKE 1 TABLET BY MOUTH EVERY DAY (Patient not taking: Reported on 07/19/2024) 90 tablet 1   [DISCONTINUED] lisinopril -hydrochlorothiazide  (ZESTORETIC ) 20-12.5 MG per tablet Take 1 tablet by mouth daily. 90 tablet 3   No current facility-administered medications on file prior to visit.    LABS/IMAGING: No results found for this or any previous visit (from the past 48 hours). No results found.  LIPID PANEL:    Component Value Date/Time   CHOL 185 07/13/2024 1055   TRIG 58.0 07/13/2024 1055   HDL 69.80 07/13/2024 1055   CHOLHDL 3 07/13/2024 1055   VLDL 11.6 07/13/2024 1055   LDLCALC 103 (H) 07/13/2024 1055   LDLDIRECT 135.0 05/11/2012 0909    No results found for: LIPOA   WEIGHTS: Wt Readings from Last 3 Encounters:  07/19/24 186 lb (84.4 kg)  06/22/24 186 lb (84.4 kg)  06/08/24 189 lb 11.2 oz (86 kg)    VITALS: BP 128/82   Pulse 67   Ht 5' 3 (1.6 m)   Wt 186 lb (84.4 kg)   LMP 07/04/2014 Comment: GYN  SpO2 97%   BMI 32.95 kg/m   EXAM: Deferred  EKG: Deferred  ASSESSMENT: Mixed dyslipidemia, goal LDL less than 70    0 CAC score and no CAD by  coronary CTA (2020), mid LAD bridge Type 2 diabetes, A1c 7.4%                          PLAN: 1.   Ms. Reino has had a recent increase in her cholesterol but a year ago her LDL was 52.  She reports tolerating atorvastatin  and ezetimibe  and that she is compliant with the medications.  I suspect that the changes must be due to dietary lifestyle modifications.  I think would be reasonable to allow another 6 months to try to see if this could be corrected otherwise she will need additional therapy, possibly PCSK9 inhibitor or Nexletol.  Will order repeat labs including NMR and LP(a) for 6 months.  Follow-up based on that.  Thanks again for the kind referral.  Vinie KYM Maxcy, MD, [facility], FNLA, FACP  [place]  [facility]  Medical Director of the [facility]  [facility] Diplomate of the American Board of Clinical Lipidology Attending Cardiologist  Direct Dial: [phone]  Fax: [phone]  Website:  [email or web address]  Vinie BROCKS Shahara Hartsfield 07/19/2024, 3:20 PM

## 2024-07-20 ENCOUNTER — Telehealth: Payer: Self-pay

## 2024-07-20 ENCOUNTER — Ambulatory Visit (INDEPENDENT_AMBULATORY_CARE_PROVIDER_SITE_OTHER): Admitting: Family Medicine

## 2024-07-20 ENCOUNTER — Other Ambulatory Visit: Payer: Self-pay

## 2024-07-20 ENCOUNTER — Encounter: Payer: Self-pay | Admitting: Family Medicine

## 2024-07-20 VITALS — BP 138/86 | HR 98 | Ht 63.0 in | Wt 187.0 lb

## 2024-07-20 DIAGNOSIS — G8929 Other chronic pain: Secondary | ICD-10-CM | POA: Diagnosis not present

## 2024-07-20 DIAGNOSIS — M25512 Pain in left shoulder: Secondary | ICD-10-CM | POA: Diagnosis not present

## 2024-07-20 MED ORDER — ATORVASTATIN CALCIUM 80 MG PO TABS: 80.0000 mg | ORAL_TABLET | Freq: Every day | ORAL | 1 refills | Status: DC

## 2024-07-20 NOTE — Progress Notes (Signed)
   I, Leotis Batter, CMA acting as a scribe for [name], MD.  Katie Morse is a 58 y.o. female who presents to [facility] today for exacerbation of her L shoulder pain. Her last visit for her L shoulder was on 10/07/20 and she was given a subacromial steroid injection.  Today, pt reports exacerbation of left shoulder sx over the past 3 months. Locates pain to lateral aspect of the shoulder radiating into the upper arm and neck. Occasional sharp shooting pain. Notes n/t in the L UE. Some weakness/decreased grip strength. Taking Tylenol  and using Voltaren  prn. ROM well maintained but not without pain, especially when reaching back.   Dx imaging: 02/22/19 L shoulder XR  Pertinent review of systems: No fevers or chills  Relevant historical information: History right shoulder surgery 2 years ago.   Exam:  BP 138/86   Pulse 98   Ht 5' 3 (1.6 m)   Wt 187 lb (84.8 kg)   LMP 07/04/2014 Comment: GYN  SpO2 99%   BMI 33.13 kg/m  General: Well Developed, well nourished, and in no acute distress.   MSK: Left shoulder: Normal appearing normal motion intact strength.  Positive Hawkins and Neer's test.  Negative Yergason's and speeds test.    Lab and Radiology Results  Procedure: Real-time Ultrasound Guided Injection of left shoulder subacromial bursa Device: Philips Affiniti 50G/GE Logiq Images permanently stored and available for review in PACS Verbal informed consent obtained.  Discussed risks and benefits of procedure. Warned about infection, bleeding, hyperglycemia damage to structures among others. Patient expresses understanding and agreement Time-out conducted.   Noted no overlying erythema, induration, or other signs of local infection.   Skin prepped in a sterile fashion.   Local anesthesia: Topical Ethyl chloride.   With sterile technique and under real time ultrasound guidance: 40 mg of Kenalog  and 2 mL of Marcaine  injected into  subacromial bursa. Fluid seen entering the bursa.   Completed without difficulty   Pain immediately resolved suggesting accurate placement of the medication.   Advised to call if fevers/chills, erythema, induration, drainage, or persistent bleeding.   Images permanently stored and available for review in the ultrasound unit.  Impression: Technically successful ultrasound guided injection.         Assessment and Plan: 58 y.o. female with left shoulder pain due to subacromial bursitis and impingement.  Plan for steroid injection today.  Continue home exercise program previously taught recheck back as needed.   PDMP not reviewed this encounter. Orders Placed This Encounter  Procedures   US  LIMITED JOINT SPACE STRUCTURES UP LEFT(NO LINKED CHARGES)    Reason for Exam (SYMPTOM  OR DIAGNOSIS REQUIRED):   left shoulder pain    Preferred imaging location?:   [facility]   No orders of the defined types were placed in this encounter.    Discussed warning signs or symptoms. Please see discharge instructions. Patient expresses understanding.   The above documentation has been reviewed and is accurate and complete [name], M.D.

## 2024-07-20 NOTE — Telephone Encounter (Signed)
 Copied from CRM #[id number]. Topic: Clinical - Lab/Test Results >> Jul 20, 2024 11:03 AM Katie Morse wrote: Reason for CRM: Patient returned call to [facility] regarding lab results.Message from provider was shared with patient.Patient verbalized understanding

## 2024-07-20 NOTE — Patient Instructions (Addendum)
 Thank you for coming in today.   You received an injection today. Seek immediate medical attention if the joint becomes red, extremely painful, or is oozing fluid.   See you back as needed.

## 2024-07-20 NOTE — Telephone Encounter (Signed)
Noted on lab result note

## 2024-07-26 DIAGNOSIS — S4992XA Unspecified injury of left shoulder and upper arm, initial encounter: Secondary | ICD-10-CM | POA: Diagnosis not present

## 2024-07-28 ENCOUNTER — Ambulatory Visit: Payer: Self-pay

## 2024-07-28 NOTE — Telephone Encounter (Signed)
 FYI Only or Action Required?: Action required by provider: pt requesting order for Life Alert, please call pt back for this request and to follow up on symptoms, pt phone disconnected before able to offer follow up appt, attempted call back x3 no answer with voicemail box full.  Patient was last seen in primary care on 05/11/2024 by Theophilus Andrews, Tully GRADE, MD.  Called Nurse Triage reporting Fall, Shoulder Pain, Knee Pain, requesting order for Life Alert, and pain shooting into arm.  Symptoms began several days ago.  Interventions attempted: OTC medications: tylenol  helps pain, Rest, hydration, or home remedies, and Other: UC visit.  Symptoms are: still severe if no OTC meds.  Triage Disposition: Call PCP Within 24 Hours  Patient/caregiver understands and will follow disposition?: Yes     Copied from CRM #[id number]. Topic: Clinical - Red Word Triage >> Jul 28, 2024  3:30 PM Suzen RAMAN wrote: Red Word that prompted transfer to Nurse Triage: pain in  left shoulder and right knee due fall on wednesday would like order placed for life alert. Reason for Disposition  [1] Caller has NON-URGENT question AND [2] triager unable to answer question  Answer Assessment - Initial Assessment Questions 1. MECHANISM: How did the fall happen?     Very bad fall Caught my foot, fell on hardwood 2. DOMESTIC VIOLENCE AND ELDER ABUSE SCREENING: Did you fall because someone pushed you or tried to hurt you? If Yes, ask: Are you safe now?     no 3. ONSET: When did the fall happen? (e.g., minutes, hours, or days ago)     Wednesday 4. LOCATION: What part of the body hit the ground? (e.g., back, buttocks, head, hips, knees, hands, head, stomach)     Right knee and fell like doing plank more on left arm, jarred my shoulder 5. INJURY: Did you hurt (injure) yourself when you fell? If Yes, ask: What did you injure? Tell me more about this? (e.g., body area; type of injury; pain severity)     Injury  to knee and shoulder 6. PAIN: Is there any pain? If Yes, ask: How bad is the pain? (e.g., Scale 0-10; or none, mild,      8.5-9/10, shooting pains in arm right now, not taken any medication, tylenol  helps ease it off 7. SIZE: For cuts, bruises, or swelling, ask: How large is it? (e.g., inches or centimeters)      Faint bruise on side right knee 9. OTHER SYMPTOMS: Do you have any other symptoms? (e.g., dizziness, fever, weakness; new-onset or worsening).      No dizziness, fever, weakness, chest pain, SOB more than usual 10. CAUSE: What do you think caused the fall (or falling)? (e.g., dizzy spell, tripped)       tripped  Did x-ray to ensure no broken bones or tore anything, seen in UC on 8/6  Requesting order for life alert, second time fallen with no one around, drove self to [facility]  Phone cut off on pt end Attempted to call pt back, no answer, unable to LVM mailbox was full   Pt requesting order for life alert, sending message to office with request. Nurse did not get the chance to offer appt for follow up with PCP since still having severe pain, pt phone disconnected before able to offer follow up appt, attempted call back x3 no answer with voicemail box full. Please call pt to follow up on request and symptoms.  Protocols used: Falls and [facility]

## 2024-07-30 ENCOUNTER — Other Ambulatory Visit: Payer: Self-pay | Admitting: Internal Medicine

## 2024-07-30 DIAGNOSIS — I10 Essential (primary) hypertension: Secondary | ICD-10-CM

## 2024-08-01 NOTE — Telephone Encounter (Signed)
 Spoke with pt, appt scheduled for 8/14 at 1pm. Routing to PCP as FYI.

## 2024-08-03 ENCOUNTER — Ambulatory Visit (INDEPENDENT_AMBULATORY_CARE_PROVIDER_SITE_OTHER): Admitting: Internal Medicine

## 2024-08-03 VITALS — BP 128/78 | HR 88 | Temp 98.2°F | Wt 184.3 lb

## 2024-08-03 DIAGNOSIS — I10 Essential (primary) hypertension: Secondary | ICD-10-CM | POA: Diagnosis not present

## 2024-08-03 DIAGNOSIS — E1169 Type 2 diabetes mellitus with other specified complication: Secondary | ICD-10-CM

## 2024-08-03 DIAGNOSIS — Z7985 Long-term (current) use of injectable non-insulin antidiabetic drugs: Secondary | ICD-10-CM | POA: Diagnosis not present

## 2024-08-03 DIAGNOSIS — E785 Hyperlipidemia, unspecified: Secondary | ICD-10-CM | POA: Diagnosis not present

## 2024-08-03 DIAGNOSIS — E559 Vitamin D deficiency, unspecified: Secondary | ICD-10-CM

## 2024-08-03 DIAGNOSIS — E538 Deficiency of other specified B group vitamins: Secondary | ICD-10-CM | POA: Diagnosis not present

## 2024-08-03 LAB — POCT GLYCOSYLATED HEMOGLOBIN (HGB A1C): Hemoglobin A1C: 7.3 % — AB (ref 4.0–5.6)

## 2024-08-03 NOTE — Assessment & Plan Note (Signed)
On monthly IM B12

## 2024-08-03 NOTE — Assessment & Plan Note (Signed)
 On max dose atorvastatin  and ezetimibe .  Since LDL remains above goal at 896 will place referral to lipid clinic.

## 2024-08-03 NOTE — Assessment & Plan Note (Signed)
 Fair control on current.

## 2024-08-03 NOTE — Assessment & Plan Note (Signed)
 A1c improved to 7.3.  Has not yet started the 7.5 mg of Mounjaro  that were sent in as she was finishing up her 5 mg supply.  Will start 7.5 mg next week.

## 2024-08-03 NOTE — Assessment & Plan Note (Signed)
Check levels next visit.

## 2024-08-03 NOTE — Progress Notes (Signed)
 Established Patient Office Visit     CC/Reason for Visit: Follow-up chronic conditions  HPI: Katie Morse is a 58 y.o. female who is coming in today for the above mentioned reasons. Past Medical History is significant for: Hypertension, hyperlipidemia, type 2 diabetes, morbid obesity, vitamin D  and B12 deficiencies.  She has been feeling well other than a fall that she suffered at home onto her left arm.  She went to urgent care and had x-rays that were negative for fracture.  She still having some pain with movement but is able to have full range of motion on exam.   Past Medical/Surgical History: Past Medical History:  Diagnosis Date   ALLERGIC RHINITIS 08/20/2008   Allergy    Anemia    Arthritis    Blood transfusion without reported diagnosis 2011   post surgery   Diabetes mellitus without complication (HCC)    DYSPNEA 11/12/2008   Dysrhythmia    a. fib   Headache(784.0) 08/20/2008   HYPERTENSION 08/20/2008   LEIOMYOMA, UTERUS 08/20/2008   PALPITATIONS, RECURRENT 11/12/2008   Sickle-cell trait (HCC) 08/20/2008   SYSTOLIC MURMUR 11/12/2008    Past Surgical History:  Procedure Laterality Date   LIPOMA EXCISION Left 07/19/2019   Procedure: EXCISION OF SUBCUTANEOUS LIPOMA LEFT BUTTOCK;  Surgeon: Belinda Cough, MD;  Location: [facility];  Service: General;  Laterality: Left;   MYOMECTOMY     fibroids   SHOULDER ARTHROSCOPY WITH OPEN ROTATOR CUFF REPAIR AND DISTAL CLAVICLE ACROMINECTOMY Right 08/13/2022   Procedure: right shoulder arthroscopy, debridement, biceps tenodesis, mini open rotator cuff tear repair;  Surgeon: Addie Cordella Hamilton, MD;  Location: [facility] OR;  Service: Orthopedics;  Laterality: Right;    Social History:  reports that she has never smoked. She has never used smokeless tobacco. She reports current alcohol use. She reports that she does not use drugs.  Allergies: Allergies  Allergen Reactions   Latex Rash   Wild Lettuce  Extract (Lactuca Virosa) Other (See Comments)    Gi -upset Other reaction(s): Other Gi -upset   Avocado Nausea Only   Lactose Intolerance (Gi)     Gi-upset   Metformin  Hcl Diarrhea   Other Other (See Comments)    Elner /Gi upset   Watermelon Flavoring Agent (Non-Screening)     Gi upset    Family History:  Family History  Problem Relation Age of Onset   Hypertension Mother    Heart Problems Mother    Cancer Father        lung and prostate ca   Diabetes Other    Breast cancer Cousin        2-twins   Colon cancer Neg Hx      Current Outpatient Medications:    acetaminophen  (TYLENOL ) 650 MG CR tablet, Take 650-1,300 mg by mouth every 8 (eight) hours as needed for pain., Disp: , Rfl:    amLODipine  (NORVASC ) 5 MG tablet, TAKE 1 TABLET (5 MG TOTAL) BY MOUTH DAILY., Disp: 90 tablet, Rfl: 1   ASPIRIN  LOW DOSE 81 MG tablet, TAKE 1 TABLET BY MOUTH EVERY DAY, Disp: 90 tablet, Rfl: 1   Aspirin -Caffeine (BAYER BACK & BODY) 500-32.5 MG TABS, Take 2 tablets by mouth at bedtime as needed (Pain)., Disp: , Rfl:    atorvastatin  (LIPITOR) 80 MG tablet, Take 1 tablet (80 mg total) by mouth at bedtime., Disp: 90 tablet, Rfl: 1   Cranberry 250 MG CAPS, Orally, Disp: , Rfl:    Cyanocobalamin  (VITAMIN B-12 IJ),  Inject 1 mL as directed every 30 (thirty) days., Disp: , Rfl:    cyclobenzaprine  (FLEXERIL ) 5 MG tablet, TAKE 1 TABLET BY MOUTH AT BEDTIME AS NEEDED FOR MUSCLE SPASMS., Disp: 30 tablet, Rfl: 1   dapagliflozin  propanediol (FARXIGA ) 5 MG TABS tablet, Take 1 tablet (5 mg total) by mouth daily before breakfast., Disp: 90 tablet, Rfl: 1   diclofenac  (VOLTAREN ) 75 MG EC tablet, TAKE 1 TABLET BY MOUTH TWICE A DAY, Disp: 60 tablet, Rfl: 0   diclofenac  Sodium (VOLTAREN ) 1 % GEL, Apply 2 g topically daily as needed (arm pain)., Disp: , Rfl:    ezetimibe  (ZETIA ) 10 MG tablet, TAKE 1 TABLET BY MOUTH EVERY DAY, Disp: 90 tablet, Rfl: 1   fluticasone (FLONASE) 50 MCG/ACT nasal spray, Place 2 sprays into  both nostrils daily as needed for allergies., Disp: , Rfl:    fluticasone (FLONASE) 50 MCG/ACT nasal spray, Place into the nose., Disp: , Rfl:    HYDROcodone -acetaminophen  (NORCO) 5-325 MG tablet, Take 1 tablet by mouth every 6 (six) hours as needed for moderate pain., Disp: 35 tablet, Rfl: 0   hydrocortisone  2.5 % cream, Apply topically 2 (two) times daily., Disp: 30 g, Rfl: 0   ketorolac (ACULAR) 0.5 % ophthalmic solution, SMARTSIG:In Eye(s), Disp: , Rfl:    losartan  (COZAAR ) 100 MG tablet, TAKE 1 TABLET BY MOUTH EVERY DAY, Disp: 90 tablet, Rfl: 1   losartan -hydrochlorothiazide  (HYZAAR) 100-12.5 MG tablet, Take 1 tablet by mouth every morning., Disp: , Rfl:    metoprolol  tartrate (LOPRESSOR ) 25 MG tablet, TAKE 1 TABLET BY MOUTH TWICE A DAY, Disp: 60 tablet, Rfl: 5   moxifloxacin (VIGAMOX) 0.5 % ophthalmic solution, Apply to eye., Disp: , Rfl:    Multiple Vitamins-Minerals (HAIR/SKIN/NAILS/BIOTIN PO), Orally, Disp: , Rfl:    nitroGLYCERIN  (NITROSTAT ) 0.4 MG SL tablet, PLACE 1 TABLET UNDER THE TONGUE EVERY 5 MINUTES AS NEEDED FOR CHEST PAIN., Disp: 25 tablet, Rfl: 2   Omega-3 Fatty Acids (FISH OIL) 500 MG CAPS, 1 capsule Orally Twice a day, Disp: , Rfl:    Polyvinyl Alcohol-Povidone PF (REFRESH) 1.4-0.6 % SOLN, Place 1 drop into both eyes in the morning, at noon, in the evening, and at bedtime., Disp: , Rfl:    POTASSIUM PO, Potassium, Disp: , Rfl:    prednisoLONE acetate (PRED FORTE) 1 % ophthalmic suspension, one drop 4 (four) times daily., Disp: , Rfl:    Prenatal Vit-Fe Fumarate-FA (PRENATAL MULTIVITAMIN) TABS tablet, Take 1 tablet by mouth daily at 12 noon., Disp: , Rfl:    PRESCRIPTION MEDICATION, every 30 (thirty) days. Vitamin  B12 injection, Disp: , Rfl:    tirzepatide  (MOUNJARO ) 7.5 MG/0.5ML Pen, Inject 7.5 mg into the skin once a week., Disp: 6 mL, Rfl: 0   trimethoprim -polymyxin b  (POLYTRIM ) ophthalmic solution, Place 2 drops into both eyes every 4 (four) hours., Disp: 10 mL, Rfl: 0    VENTOLIN  HFA 108 (90 Base) MCG/ACT inhaler, INHALE 1 PUFF INTO THE LUNGS EVERY 6 HOURS AS NEEDED FOR WHEEZING OR SHORTNESS OF BREATH., Disp: 18 each, Rfl: 3   vitamin C (ASCORBIC ACID) 500 MG tablet, Take 500 mg by mouth daily., Disp: , Rfl:    Vitamin D , Ergocalciferol , (DRISDOL ) 1.25 MG (50000 UNIT) CAPS capsule, Take 1 capsule (50,000 Units total) by mouth every 7 (seven) days for 12 doses., Disp: 12 capsule, Rfl: 0   vitamin E 200 UNIT capsule, one capsule (200 Units dose)., Disp: , Rfl:   Review of Systems:  Negative unless indicated in HPI.  Physical Exam: Vitals:   08/03/24 1311 08/03/24 1345  BP: 134/84 128/78  Pulse: 88   Temp: 98.2 F (36.8 C)   TempSrc: Oral   SpO2: 98%   Weight: 184 lb 4.8 oz (83.6 kg)     Body mass index is 32.65 kg/m.   Physical Exam Vitals reviewed.  Constitutional:      Appearance: Normal appearance. She is obese.  HENT:     Head: Normocephalic and atraumatic.  Eyes:     Conjunctiva/sclera: Conjunctivae normal.  Cardiovascular:     Rate and Rhythm: Normal rate and regular rhythm.  Pulmonary:     Effort: Pulmonary effort is normal.     Breath sounds: Normal breath sounds.  Skin:    General: Skin is warm and dry.  Neurological:     General: No focal deficit present.     Mental Status: She is alert and oriented to person, place, and time.  Psychiatric:        Mood and Affect: Mood normal.        Behavior: Behavior normal.        Thought Content: Thought content normal.        Judgment: Judgment normal.      Impression and Plan:  Type 2 diabetes mellitus with other specified complication, without long-term current use of insulin  (HCC) Assessment & Plan: A1c improved to 7.3.  Has not yet started the 7.5 mg of Mounjaro  that were sent in as she was finishing up her 5 mg supply.  Will start 7.5 mg next week.  Orders: -     POCT glycosylated hemoglobin (Hb A1C)  Essential hypertension Assessment & Plan: Fair control on  current.   Hyperlipidemia associated with type 2 diabetes mellitus (HCC) Assessment & Plan: On max dose atorvastatin  and ezetimibe .  Since LDL remains above goal at 896 will place referral to lipid clinic.  Orders: -     AMB Referral to [facility]  Morbid obesity [facility]) Assessment & Plan: -Discussed healthy lifestyle, including increased physical activity and better food choices to promote weight loss. Congratulated on weight loss success thus far.   Vitamin B12 deficiency Assessment & Plan: On monthly IM B12.   Vitamin D  deficiency Assessment & Plan: Check levels next visit.      Time spent:32 minutes reviewing chart, interviewing and examining patient and formulating plan of care.     Tully Theophilus Andrews, MD [facility]

## 2024-08-03 NOTE — Assessment & Plan Note (Signed)
-  Discussed healthy lifestyle, including increased physical activity and better food choices to promote weight loss. -Congratulated on weight loss success thus far.

## 2024-08-14 ENCOUNTER — Ambulatory Visit (INDEPENDENT_AMBULATORY_CARE_PROVIDER_SITE_OTHER)

## 2024-08-14 DIAGNOSIS — E538 Deficiency of other specified B group vitamins: Secondary | ICD-10-CM

## 2024-08-14 MED ORDER — CYANOCOBALAMIN 1000 MCG/ML IJ SOLN
1000.0000 ug | Freq: Once | INTRAMUSCULAR | Status: AC
  Administered 2024-08-14: 1000 ug via INTRAMUSCULAR

## 2024-08-14 NOTE — Progress Notes (Signed)
 Patient is in office today for a nurse visit for B12 Injection. Patient Injection was given in the  Right deltoid. Patient tolerated injection well.

## 2024-08-18 ENCOUNTER — Ambulatory Visit: Payer: Self-pay

## 2024-08-18 NOTE — Telephone Encounter (Signed)
 FYI Only or Action Required?: Action required by provider: update on patient condition.  Patient was last seen in primary care on 08/03/2024 by Theophilus Andrews, Tully GRADE, MD.  Called Nurse Triage reporting Arm Pain and Fall.  Symptoms began several weeks ago.  Interventions attempted: Other: has been seen at [facility] and at office, will f/u with sports med.  Symptoms are: unchanged.  Triage Disposition: See PCP When Office is Open (Within 3 Days)  Patient/caregiver understands and will follow disposition?: Yes   Copied from CRM #[id number]. Topic: Clinical - Red Word Triage >> Aug 18, 2024  2:21 PM Lauren C wrote: Red Word that prompted transfer to Nurse Triage: Pain in arm for a few weeks since a fall. Reason for Disposition  [1] After 2 weeks AND [2] still painful  Answer Assessment - Initial Assessment Questions 1. MECHANISM: How did the injury happen?     Patient tripped and feel forward, catching herself on her forearm 2. ONSET: When did the injury happen? (e.g., minutes, hours ago)      A little more than a week ago 3. LOCATION: Where is the injury located? Which arm?     Left arm from elbow to shoulder to neck.  Collar bone pain 4. APPEARANCE of INJURY: What does the injury look like?      Patient can feel a know 5. SEVERITY: Can you use the arm normally?      Pain with movement, but uses normally 6. SWELLING or BRUISING: is there any swelling or bruising? If Yes, ask: How large is it? (e.g., inches, centimeters)      Bruising has reduced 7. PAIN: Is there pain? If Yes, ask: How bad is the pain? (Scale 0-10; or none, mild, moderate, severe)     8/10 8. TETANUS: For any breaks in the skin, ask: When was your last tetanus booster?     N/a 9. OTHER SYMPTOMS: Do you have any other symptoms?  (e.g., numbness in hand)     A little tingly 10. PREGNANCY: Is there any chance you are pregnant? When was your last menstrual period?       N/a   Patient did  go to UC and received XR  Patient called PCP in error, was trying to reach Dr. [name], correct phone number provided [phone]  Protocols used: Arm Injury-A-AH

## 2024-08-22 NOTE — Telephone Encounter (Signed)
 noted

## 2024-08-23 ENCOUNTER — Other Ambulatory Visit: Payer: Self-pay

## 2024-08-23 ENCOUNTER — Ambulatory Visit (INDEPENDENT_AMBULATORY_CARE_PROVIDER_SITE_OTHER): Admitting: Family Medicine

## 2024-08-23 VITALS — BP 138/86 | HR 96 | Ht 63.0 in | Wt 184.0 lb

## 2024-08-23 DIAGNOSIS — M25512 Pain in left shoulder: Secondary | ICD-10-CM | POA: Diagnosis not present

## 2024-08-23 DIAGNOSIS — G8929 Other chronic pain: Secondary | ICD-10-CM | POA: Diagnosis not present

## 2024-08-23 NOTE — Patient Instructions (Addendum)
 Thank you for coming in today.   Please use Voltaren  gel (Generic Diclofenac  Gel) up to 4x daily for pain as needed.  This is available over-the-counter as both the name brand Voltaren  gel and the generic diclofenac  gel.   You should hear from MRI scheduling within 1 week. If you do not hear please let me know.   Tylenol   Let's see what the MRI shows and plan from there

## 2024-08-23 NOTE — Progress Notes (Unsigned)
 Katie Ileana Collet, PhD, LAT, ATC acting as a scribe for [name], MD.  Katie Morse is a 58 y.o. female who presents to [facility] today for L arm pain. Pt was last seen by Dr. Lloyd on 07/20/24 for L shoulder pain.  Today, pt c/o L arm pain ongoing since 8/6. Pt suffered a fall, tripped up on a case of water laying on the floor, as she was rushing to the bathroom. She was seen at the [facility] following the fall. Pt locates pain to proximal humerus, periscapular area, and all over L shoulder joint.   Dx imaging: 07/26/24 L shoulder XR 02/22/19 L shoulder XR  Pertinent review of systems: No fevers or chills  Relevant historical information: History of right shoulder surgery with Dr. Addie   Exam:  BP 138/86   Pulse 96   Ht 5' 3 (1.6 m)   Wt 184 lb (83.5 kg)   LMP 07/04/2014 Comment: GYN  SpO2 99%   BMI 32.59 kg/m  General: Well Developed, well nourished, and in no acute distress.   MSK: Left shoulder normal-appearing range of motion reduced abduction.  Strength reduced abduction.  Otherwise strength and motion are intact. Positive Hawkins and Neer's test.    Lab and Radiology Results  Left shoulder x-ray from [facility] July 26, 2024 Katie Tinnie SQUIBB, MD - 07/26/2024 Formatting of this note might be different from the original. TECHNIQUE: XR SHOULDER 2+ VIEWS LEFT Over read interpretation provided.  Primary interpretation provided by on-site provider referenced in electronic medical record at time of over read.  PRIMARY INTERPRETATION:Left shoulder x-ray 2 views reveals diffuse osteoarthritic changes with no  acute abnormality or fracture noted clavicle acromion process humerus. Pt had great difficulty holding positions for x-rays due to left shoulder and right knee pain. Best attainable images. AMH Results viewed in EMR/EPIC: Primary read performed by VINIE LELON NEIGHBORS    AGREE/DISAGREE: Agree with primary interpretation  ADDITIONAL  FINDINGS:None  Note:  Discordant or unexpected findings sent via EPIC Inbox Message to provider and will be reconciled by on-site provider on receipt of radiologist over read.  Electronically Signed by: Tinnie Guerry MD on 07/26/2024 8:52 PM Exam End: 07/26/24 17:28       Assessment and Plan: 58 y.o. female with chronic left shoulder pain worsening after fall.  This is a chronic ongoing issue that has been treated so far with PT and subacromial injection.  She had a fall about a month ago resulting in worse pain.  X-ray at that time did not show fracture or severe bony injury.  At this point she is failing typical conservative management.  Plan to proceed with MRI for potential surgical planning or different injections.  If surgery indicated will refer to Dr. Addie.   PDMP not reviewed this encounter. Orders Placed This Encounter  Procedures   MR SHOULDER LEFT WO CONTRAST    Standing Status:   Future    Expiration Date:   08/23/2025    What is the patient's sedation requirement?:   No Sedation    Does the patient have a pacemaker or implanted devices?:   No    Preferred imaging location?:   [facility] (table limit-550lbs)   No orders of the defined types were placed in this encounter.    Discussed warning signs or symptoms. Please see discharge instructions. Patient expresses understanding.   The above documentation has been reviewed and is accurate and complete [name], M.D.

## 2024-09-03 ENCOUNTER — Ambulatory Visit
Admission: RE | Admit: 2024-09-03 | Discharge: 2024-09-03 | Disposition: A | Discharge status: Home / Self Care | Source: Ambulatory Visit | Attending: Family Medicine | Admitting: Family Medicine

## 2024-09-03 DIAGNOSIS — G8929 Other chronic pain: Secondary | ICD-10-CM

## 2024-09-06 ENCOUNTER — Ambulatory Visit: Payer: Self-pay | Admitting: Family Medicine

## 2024-09-06 DIAGNOSIS — G8929 Other chronic pain: Secondary | ICD-10-CM

## 2024-09-06 NOTE — Progress Notes (Signed)
 Shoulder MRI shows full-thickness retracted rotator cuff tear with unfortunately significant atrophy of the muscles.  This is going to be hard to fix.  I am going to refer to Dr. Addie.  You should hear from his office soon.

## 2024-09-07 ENCOUNTER — Other Ambulatory Visit: Payer: Self-pay | Admitting: Internal Medicine

## 2024-09-07 DIAGNOSIS — E559 Vitamin D deficiency, unspecified: Secondary | ICD-10-CM

## 2024-09-07 MED ORDER — FLUTICASONE PROPIONATE 50 MCG/ACT NA SUSP: 1.0000 | Freq: Every day | NASAL | 2 refills | Status: DC

## 2024-09-07 NOTE — Telephone Encounter (Signed)
 Copied from CRM [id number]. Topic: Clinical - Medication Refill >> Sep 07, 2024 12:41 PM Katie Morse wrote: Medication: fluticasone  (FLONASE ) 50 MCG/ACT nasal spray  Has the patient contacted their pharmacy? Yes (Agent: If no, request that the patient contact the pharmacy for the refill. If patient does not wish to contact the pharmacy document the reason why and proceed with request.) (Agent: If yes, when and what did the pharmacy advise?)  This is the patient'Morse preferred pharmacy:  [facility] [phone] - [place], [place] - [address] [place] [address] Phone: [phone] Fax: [phone]    Is this the correct pharmacy for this prescription? No If no, delete pharmacy and type the correct one.   Has the prescription been filled recently? No  Is the patient out of the medication? Yes  Has the patient been seen for an appointment in the last year OR does the patient have an upcoming appointment? Yes  Can we respond through MyChart? No  Agent: Please be advised that Rx refills may take up to 3 business days. We ask that you follow-up with your pharmacy.

## 2024-09-07 NOTE — Telephone Encounter (Signed)
 Copied from CRM [id number]. Topic: Clinical - Medication Refill >> Sep 07, 2024 12:44 PM Shardie S wrote: Medication: Vitamin D , Ergocalciferol , (DRISDOL ) 1.25 MG (50000 UNIT) CAPS capsule  Has the patient contacted their pharmacy? Yes (Agent: If no, request that the patient contact the pharmacy for the refill. If patient does not wish to contact the pharmacy document the reason why and proceed with request.) (Agent: If yes, when and what did the pharmacy advise?)  This is the patient's preferred pharmacy:  [phone] [place], [place] - [address] [place] [address] Phone: [phone] Fax: [phone]    Is this the correct pharmacy for this prescription? Yes If no, delete pharmacy and type the correct one.   Has the prescription been filled recently? No  Is the patient out of the medication? Yes  Has the patient been seen for an appointment in the last year OR does the patient have an upcoming appointment? Yes  Can we respond through MyChart? No  Agent: Please be advised that Rx refills may take up to 3 business days. We ask that you follow-up with your pharmacy.

## 2024-09-14 ENCOUNTER — Ambulatory Visit

## 2024-09-15 ENCOUNTER — Ambulatory Visit

## 2024-09-15 DIAGNOSIS — E538 Deficiency of other specified B group vitamins: Secondary | ICD-10-CM | POA: Diagnosis not present

## 2024-09-15 MED ORDER — CYANOCOBALAMIN 1000 MCG/ML IJ SOLN
1000.0000 ug | Freq: Once | INTRAMUSCULAR | Status: AC
  Administered 2024-09-15: 1000 ug via INTRAMUSCULAR

## 2024-09-15 NOTE — Progress Notes (Signed)
 Per orders of Dr. Caryl Never, injection of Cyanocobalamin 1000 mcg given by Trudie Cervantes L Pesach Frisch. Patient tolerated injection well.

## 2024-09-24 ENCOUNTER — Other Ambulatory Visit: Payer: Self-pay | Admitting: Internal Medicine

## 2024-09-24 DIAGNOSIS — I10 Essential (primary) hypertension: Secondary | ICD-10-CM

## 2024-09-28 ENCOUNTER — Ambulatory Visit: Admitting: Sports Medicine

## 2024-10-04 ENCOUNTER — Ambulatory Visit: Admitting: Orthopedic Surgery

## 2024-10-13 ENCOUNTER — Ambulatory Visit (INDEPENDENT_AMBULATORY_CARE_PROVIDER_SITE_OTHER)

## 2024-10-13 DIAGNOSIS — E538 Deficiency of other specified B group vitamins: Secondary | ICD-10-CM | POA: Diagnosis not present

## 2024-10-13 MED ORDER — CYANOCOBALAMIN 1000 MCG/ML IJ SOLN
1000.0000 ug | Freq: Once | INTRAMUSCULAR | Status: AC
  Administered 2024-10-13: 1000 ug via INTRAMUSCULAR

## 2024-10-13 NOTE — Progress Notes (Signed)
 Patient is in office today for a nurse visit for B12 Injection. Patient Injection was given in the  Left deltoid. Patient tolerated injection well.

## 2024-10-20 ENCOUNTER — Ambulatory Visit: Admitting: Surgical

## 2024-10-20 ENCOUNTER — Other Ambulatory Visit: Payer: Self-pay

## 2024-10-20 ENCOUNTER — Other Ambulatory Visit (INDEPENDENT_AMBULATORY_CARE_PROVIDER_SITE_OTHER): Payer: Self-pay

## 2024-10-20 DIAGNOSIS — M7522 Bicipital tendinitis, left shoulder: Secondary | ICD-10-CM

## 2024-10-20 DIAGNOSIS — M542 Cervicalgia: Secondary | ICD-10-CM

## 2024-10-20 DIAGNOSIS — M25512 Pain in left shoulder: Secondary | ICD-10-CM | POA: Diagnosis not present

## 2024-10-20 DIAGNOSIS — M5412 Radiculopathy, cervical region: Secondary | ICD-10-CM

## 2024-10-22 ENCOUNTER — Encounter: Payer: Self-pay | Admitting: Surgical

## 2024-10-22 MED ORDER — BUPIVACAINE HCL 0.25 % IJ SOLN
9.0000 mL | INTRAMUSCULAR | Status: AC | PRN
  Administered 2024-10-20: 9 mL via INTRA_ARTICULAR

## 2024-10-22 MED ORDER — TRIAMCINOLONE ACETONIDE 40 MG/ML IJ SUSP
40.0000 mg | INTRAMUSCULAR | Status: AC | PRN
  Administered 2024-10-20: 40 mg via INTRA_ARTICULAR

## 2024-10-22 NOTE — Progress Notes (Signed)
 Office Visit Note   Patient: Katie Morse           Date of Birth: 1965-12-29           MRN: [id number] Visit Date: 10/20/2024 Requested by: Theophilus Andrews, Tully GRADE, MD [address] [place],  [place] [address] PCP: Theophilus Andrews, Tully GRADE, MD  Subjective: Chief Complaint  Patient presents with   Left Shoulder - Pain    HPI: Katie Morse is a 58 y.o. female who presents to the office reporting left shoulder pain.  Patient states that she fell in August landed on her elbows and knee.  Since then she has had increased left shoulder pain.  She describes anterior pain in the shoulder region that will radiate into the axilla.  Also has radiation into the bicep muscle and down to her hand primarily to the left thumb.  She will also have posterior pain along the shoulder blade and especially in the medial scapular border and neck/trapezius region.  She has associated numbness and tingling in her hands mostly in the hand but to some extent throughout the rest of her arm as well.  These shoulder blade and arm symptoms are new since her fall.  She denies any history of left shoulder surgery or neck surgery.  Does have history of right shoulder rotator cuff repair that was performed by Dr. Addie several years ago and is doing very well for her.  She is right-hand dominant.  Feels like she is getting weaker in her left arm.  She takes Tylenol  and ibuprofen with slight relief.  She has difficulty with going over her head and taking her arm behind her.  Has to position her arm to get comfortable especially at night.  Pain will wake her up from sleep at night.  She has seen Dr. Joane for this who has ordered MRI of the left shoulder demonstrating full-thickness retracted tear of the supraspinatus and half of the infraspinatus with significant fatty atrophy of the supraspinatus and infraspinatus..                ROS: All systems reviewed are negative as they relate to the  chief complaint within the history of present illness.  Patient denies fevers or chills.  Assessment & Plan: Visit Diagnoses:  1. Neck pain   2. Acute pain of left shoulder   3. Cervicalgia   4. Radiculopathy, cervical region     Plan: Impression is 58 year old female who is here today for evaluation of left shoulder pain following a fall in August.  She has seen Dr. Artist Joane with MRI demonstrating full-thickness retracted supraspinatus tear that is retracted near the glenoid rim with moderate to severe fatty atrophy of the supraspinatus and moderate atrophy of the infraspinatus as well.  Based on MRI imaging this is not a fixable tear and with her excellent external rotation strength and active motion (given the rotator cuff pathology present) as well as negative external rotation lag sign and Hornblower sign, do not really see any role for tendon transfer.  Main surgical option for her shoulder if this pain becomes unmanageable would be reverse shoulder arthroplasty versus arthroscopy with debridement and bicep tenodesis.  However, most of her pain on evaluation today seems to be more related to potential cervical spine pathology given the radiating pain down to her hands as well as the numbness that is present in the medial scapular border pain as well as trapezius/neck pain.  As such, we  will plan for MRI of the cervical spine to further evaluate left sided radiculopathy and in the meantime to see how much of this pain may be coming from her shoulder, glenohumeral injection was administered today under ultrasound guidance.  Patient tolerated procedure well and she will let us  know at her next appointment to review the MRI scan how much this glenohumeral injection helped her.  Follow-up after MRI to review results.  Plan was discussed with Dr. Addie today and patient examined by him as well.  Follow-Up Instructions: No follow-ups on file.   Orders:  Orders Placed This Encounter  Procedures    XR Cervical Spine 2 or 3 views   US  Guided Needle Placement - No Linked Charges   MR Cervical Spine w/o contrast   No orders of the defined types were placed in this encounter.     Procedures: Large Joint Inj: L glenohumeral on 10/20/2024 11:47 AM Indications: pain and diagnostic evaluation Details: 22 G 3.5 in needle, ultrasound-guided posterior approach Medications: 9 mL bupivacaine  0.25 %; 40 mg triamcinolone  acetonide 40 MG/ML Outcome: tolerated well, no immediate complications Procedure, treatment alternatives, risks and benefits explained, specific risks discussed. Consent was given by the patient. Immediately prior to procedure a time out was called to verify the correct patient, procedure, equipment, support staff and site/side marked as required. Patient was prepped and draped in the usual sterile fashion.       Clinical Data: No additional findings.  Objective: Vital Signs: LMP 07/04/2014 Comment: GYN  Physical Exam:  Constitutional: Patient appears well-developed HEENT:  Head: Normocephalic Eyes:EOM are normal Neck: Normal range of motion Cardiovascular: Normal rate Pulmonary/chest: Effort normal Neurologic: Patient is alert Skin: Skin is warm Psychiatric: Patient has normal mood and affect  Ortho Exam: Ortho exam demonstrates ortho exam demonstrates left shoulder with 50 degrees X rotation, 120 degrees abduction, 175 degrees forward elevation which is compared with the right shoulder with 50 degrees external rotation, 100 degrees abduction, 180 degrees forward elevation passively.  She has active forward elevation of the left shoulder to about 150 degrees.  No obvious deformity to inspection of the shoulder.  Axillary nerve is intact with deltoid firing.  2+ radial pulse of the bilateral upper extremities.  Intact EPL, FPL, finger abduction, pronation/supination, bicep, tricep, deltoid.  Rotator cuff strength testing demonstrates infraspinatus/external rotation  strength rated 5 -/5.  Subscap strength rated 5/5.  Deltoid/abduction strength rated 5/5.SABRA  Tender with palpation over bicipital groove moderately but no tenderness over the [facility] joint.SABRA    Positive Neer's and Hawkins impingement signs.  No cellulitis or skin changes noted.  Moderate soft tissue crepitus noted with passive motion of the shoulder consistent with rotator cuff pathology.  Negative external rotation lag sign.  Negative Hornblower sign.  Negative Spurling sign.  Positive Lhermitte sign reproducing trapezius and shoulder pain.  No pain reproduced with cervical spine range of motion.  Specialty Comments:  No specialty comments available.  Imaging: No results found.   PMFS History: Patient Active Problem List   Diagnosis Date Noted   Complete tear of right rotator cuff    Biceps tendinitis on right    Degenerative superior labral anterior-to-posterior (SLAP) tear of right shoulder    Vitamin B12 deficiency 08/13/2021   Chronic pain of both knees 04/30/2020   Vitamin D  deficiency 09/22/2019   Chest pain 08/03/2019   Chronic left shoulder pain 02/22/2019   Neck pain 02/22/2019   Hyperlipidemia associated with type 2 diabetes mellitus (HCC) 02/17/2019  Morbid obesity (HCC) 02/17/2019   DM (diabetes mellitus), type 2 (HCC) 08/04/2016   Obesity, unspecified 09/05/2013   LEIOMYOMA, UTERUS 08/20/2008   SICKLE-CELL TRAIT 08/20/2008   Essential hypertension 08/20/2008   Past Medical History:  Diagnosis Date   ALLERGIC RHINITIS 08/20/2008   Allergy    Anemia    Arthritis    Blood transfusion without reported diagnosis 2011   post surgery   Diabetes mellitus without complication (HCC)    DYSPNEA 11/12/2008   Dysrhythmia    a. fib   Headache(784.0) 08/20/2008   HYPERTENSION 08/20/2008   LEIOMYOMA, UTERUS 08/20/2008   PALPITATIONS, RECURRENT 11/12/2008   Sickle-cell trait 08/20/2008   SYSTOLIC MURMUR 11/12/2008    Family History  Problem Relation Age of Onset    Hypertension Mother    Heart Problems Mother    Cancer Father        lung and prostate ca   Diabetes Other    Breast cancer Cousin        2-twins   Colon cancer Neg Hx     Past Surgical History:  Procedure Laterality Date   LIPOMA EXCISION Left 07/19/2019   Procedure: EXCISION OF SUBCUTANEOUS LIPOMA LEFT BUTTOCK;  Surgeon: Belinda Cough, MD;  Location: [facility];  Service: General;  Laterality: Left;   MYOMECTOMY     fibroids   SHOULDER ARTHROSCOPY WITH OPEN ROTATOR CUFF REPAIR AND DISTAL CLAVICLE ACROMINECTOMY Right 08/13/2022   Procedure: right shoulder arthroscopy, debridement, biceps tenodesis, mini open rotator cuff tear repair;  Surgeon: Addie Cordella Hamilton, MD;  Location: [facility];  Service: Orthopedics;  Laterality: Right;   Social History   Occupational History   Not on file  Tobacco Use   Smoking status: Never   Smokeless tobacco: Never  Vaping Use   Vaping status: Never Used  Substance and Sexual Activity   Alcohol use: Yes    Alcohol/week: 0.0 standard drinks of alcohol    Comment: rarely   Drug use: No   Sexual activity: Not Currently

## 2024-10-23 ENCOUNTER — Encounter: Payer: Self-pay | Admitting: Radiology

## 2024-11-02 ENCOUNTER — Ambulatory Visit: Admitting: Internal Medicine

## 2024-11-07 ENCOUNTER — Encounter: Payer: Self-pay | Admitting: Surgical

## 2024-11-09 ENCOUNTER — Ambulatory Visit: Admitting: Internal Medicine

## 2024-11-09 ENCOUNTER — Inpatient Hospital Stay: Admission: RE | Admit: 2024-11-09 | Discharge: 2024-11-09 | Attending: Surgical

## 2024-11-09 VITALS — BP 128/78 | Temp 98.4°F | Wt 190.5 lb

## 2024-11-09 DIAGNOSIS — J302 Other seasonal allergic rhinitis: Secondary | ICD-10-CM

## 2024-11-09 DIAGNOSIS — I10 Essential (primary) hypertension: Secondary | ICD-10-CM

## 2024-11-09 DIAGNOSIS — I499 Cardiac arrhythmia, unspecified: Secondary | ICD-10-CM

## 2024-11-09 DIAGNOSIS — E1169 Type 2 diabetes mellitus with other specified complication: Secondary | ICD-10-CM | POA: Diagnosis not present

## 2024-11-09 DIAGNOSIS — M542 Cervicalgia: Secondary | ICD-10-CM | POA: Diagnosis not present

## 2024-11-09 DIAGNOSIS — M4802 Spinal stenosis, cervical region: Secondary | ICD-10-CM | POA: Diagnosis not present

## 2024-11-09 DIAGNOSIS — E785 Hyperlipidemia, unspecified: Secondary | ICD-10-CM

## 2024-11-09 DIAGNOSIS — M5412 Radiculopathy, cervical region: Secondary | ICD-10-CM

## 2024-11-09 LAB — POCT GLYCOSYLATED HEMOGLOBIN (HGB A1C): Hemoglobin A1C: 7.8 % — AB (ref 4.0–5.6)

## 2024-11-09 MED ORDER — LOSARTAN POTASSIUM 100 MG PO TABS: 100.0000 mg | ORAL_TABLET | Freq: Every day | ORAL | 1 refills | Status: AC

## 2024-11-09 MED ORDER — HYDROCORTISONE 2.5 % EX CREA: TOPICAL_CREAM | Freq: Two times a day (BID) | CUTANEOUS | 0 refills | Status: AC

## 2024-11-09 MED ORDER — EZETIMIBE 10 MG PO TABS: 10.0000 mg | ORAL_TABLET | Freq: Every day | ORAL | 1 refills | Status: AC

## 2024-11-09 MED ORDER — DAPAGLIFLOZIN PROPANEDIOL 5 MG PO TABS: 5.0000 mg | ORAL_TABLET | Freq: Every day | ORAL | 1 refills | Status: AC

## 2024-11-09 MED ORDER — METOPROLOL TARTRATE 25 MG PO TABS: 25.0000 mg | ORAL_TABLET | Freq: Two times a day (BID) | ORAL | 1 refills | Status: AC

## 2024-11-09 MED ORDER — FLUTICASONE PROPIONATE 50 MCG/ACT NA SUSP: 1.0000 | Freq: Every day | NASAL | 2 refills | Status: AC

## 2024-11-09 MED ORDER — ATORVASTATIN CALCIUM 80 MG PO TABS: 80.0000 mg | ORAL_TABLET | Freq: Every day | ORAL | 1 refills | Status: AC

## 2024-11-09 MED ORDER — AMLODIPINE BESYLATE 5 MG PO TABS: 5.0000 mg | ORAL_TABLET | Freq: Every day | ORAL | 1 refills | Status: AC

## 2024-11-09 MED ORDER — ALBUTEROL SULFATE HFA 108 (90 BASE) MCG/ACT IN AERS: 2.0000 | INHALATION_SPRAY | Freq: Four times a day (QID) | RESPIRATORY_TRACT | 3 refills | Status: AC | PRN

## 2024-11-09 NOTE — Progress Notes (Addendum)
 Established Patient Office Visit     CC/Reason for Visit: Follow-up chronic medical conditions  HPI: Katie Morse is a 58 y.o. female who is coming in today for the above mentioned reasons. Past Medical History is significant for: Hypertension, hyperlipidemia, type 2 diabetes, morbid obesity, vitamin D  and B12 deficiencies.  She has been under a lot of stress.  She admits to being a little more short of breath, denies palpitations or chest pain.  She just started Mounjaro  7.5 mg as she was finishing out her 5 mg supply.  She admits to heavy dietary indiscretions as she currently does not have a vehicle and has been ordering a lot of takeout.   Past Medical/Surgical History: Past Medical History:  Diagnosis Date   ALLERGIC RHINITIS 08/20/2008   Allergy    Anemia    Arthritis    Blood transfusion without reported diagnosis 2011   post surgery   Diabetes mellitus without complication (HCC)    DYSPNEA 11/12/2008   Dysrhythmia    a. fib   Headache(784.0) 08/20/2008   HYPERTENSION 08/20/2008   LEIOMYOMA, UTERUS 08/20/2008   PALPITATIONS, RECURRENT 11/12/2008   Sickle-cell trait 08/20/2008   SYSTOLIC MURMUR 11/12/2008    Past Surgical History:  Procedure Laterality Date   LIPOMA EXCISION Left 07/19/2019   Procedure: EXCISION OF SUBCUTANEOUS LIPOMA LEFT BUTTOCK;  Surgeon: Belinda Cough, MD;  Location: [facility];  Service: General;  Laterality: Left;   MYOMECTOMY     fibroids   SHOULDER ARTHROSCOPY WITH OPEN ROTATOR CUFF REPAIR AND DISTAL CLAVICLE ACROMINECTOMY Right 08/13/2022   Procedure: right shoulder arthroscopy, debridement, biceps tenodesis, mini open rotator cuff tear repair;  Surgeon: Addie Cordella Hamilton, MD;  Location: [facility];  Service: Orthopedics;  Laterality: Right;    Social History:  reports that she has never smoked. She has never used smokeless tobacco. She reports current alcohol use. She reports that she does not use  drugs.  Allergies: Allergies  Allergen Reactions   Latex Rash   Wild Lettuce Extract (Lactuca Virosa) Other (See Comments)    Gi -upset Other reaction(s): Other Gi -upset   Avocado Nausea Only   Lactose Intolerance (Gi)     Gi-upset   Metformin  Hcl Diarrhea   Other Other (See Comments)    Elner /Gi upset   Watermelon Flavoring Agent (Non-Screening)     Gi upset    Family History:  Family History  Problem Relation Age of Onset   Hypertension Mother    Heart Problems Mother    Cancer Father        lung and prostate ca   Diabetes Other    Breast cancer Cousin        2-twins   Colon cancer Neg Hx      Current Outpatient Medications:    ASPIRIN  LOW DOSE 81 MG tablet, TAKE 1 TABLET BY MOUTH EVERY DAY, Disp: 90 tablet, Rfl: 1   Aspirin -Caffeine (BAYER BACK & BODY) 500-32.5 MG TABS, Take 2 tablets by mouth at bedtime as needed (Pain)., Disp: , Rfl:    Cranberry 250 MG CAPS, Orally, Disp: , Rfl:    Cyanocobalamin  (VITAMIN B-12 IJ), Inject 1 mL as directed every 30 (thirty) days., Disp: , Rfl:    cyclobenzaprine  (FLEXERIL ) 5 MG tablet, TAKE 1 TABLET BY MOUTH AT BEDTIME AS NEEDED FOR MUSCLE SPASMS., Disp: 30 tablet, Rfl: 1   diclofenac  (VOLTAREN ) 75 MG EC tablet, TAKE 1 TABLET BY MOUTH TWICE A DAY, Disp: 60 tablet, Rfl:  0   diclofenac  Sodium (VOLTAREN ) 1 % GEL, Apply 2 g topically daily as needed (arm pain)., Disp: , Rfl:    fluticasone  (FLONASE ) 50 MCG/ACT nasal spray, Place 2 sprays into both nostrils daily as needed for allergies., Disp: , Rfl:    HYDROcodone -acetaminophen  (NORCO) 5-325 MG tablet, Take 1 tablet by mouth every 6 (six) hours as needed for moderate pain., Disp: 35 tablet, Rfl: 0   ketorolac (ACULAR) 0.5 % ophthalmic solution, SMARTSIG:In Eye(s), Disp: , Rfl:    moxifloxacin (VIGAMOX) 0.5 % ophthalmic solution, Apply to eye., Disp: , Rfl:    Multiple Vitamins-Minerals (HAIR/SKIN/NAILS/BIOTIN PO), Orally, Disp: , Rfl:    nitroGLYCERIN  (NITROSTAT ) 0.4 MG SL  tablet, PLACE 1 TABLET UNDER THE TONGUE EVERY 5 MINUTES AS NEEDED FOR CHEST PAIN., Disp: 25 tablet, Rfl: 2   Omega-3 Fatty Acids (FISH OIL) 500 MG CAPS, 1 capsule Orally Twice a day, Disp: , Rfl:    Polyvinyl Alcohol-Povidone PF (REFRESH) 1.4-0.6 % SOLN, Place 1 drop into both eyes in the morning, at noon, in the evening, and at bedtime., Disp: , Rfl:    POTASSIUM PO, Potassium, Disp: , Rfl:    prednisoLONE acetate (PRED FORTE) 1 % ophthalmic suspension, one drop 4 (four) times daily., Disp: , Rfl:    Prenatal Vit-Fe Fumarate-FA (PRENATAL MULTIVITAMIN) TABS tablet, Take 1 tablet by mouth daily at 12 noon., Disp: , Rfl:    tirzepatide  (MOUNJARO ) 7.5 MG/0.5ML Pen, Inject 7.5 mg into the skin once a week., Disp: 6 mL, Rfl: 0   trimethoprim -polymyxin b  (POLYTRIM ) ophthalmic solution, Place 2 drops into both eyes every 4 (four) hours., Disp: 10 mL, Rfl: 0   vitamin C (ASCORBIC ACID) 500 MG tablet, Take 500 mg by mouth daily., Disp: , Rfl:    vitamin E 200 UNIT capsule, one capsule (200 Units dose)., Disp: , Rfl:    albuterol  (VENTOLIN  HFA) 108 (90 Base) MCG/ACT inhaler, Inhale 2 puffs into the lungs every 6 (six) hours as needed for wheezing or shortness of breath., Disp: 18 each, Rfl: 3   amLODipine  (NORVASC ) 5 MG tablet, Take 1 tablet (5 mg total) by mouth daily., Disp: 90 tablet, Rfl: 1   atorvastatin  (LIPITOR) 80 MG tablet, Take 1 tablet (80 mg total) by mouth at bedtime., Disp: 90 tablet, Rfl: 1   dapagliflozin  propanediol (FARXIGA ) 5 MG TABS tablet, Take 1 tablet (5 mg total) by mouth daily before breakfast., Disp: 90 tablet, Rfl: 1   ezetimibe  (ZETIA ) 10 MG tablet, Take 1 tablet (10 mg total) by mouth daily., Disp: 90 tablet, Rfl: 1   fluticasone  (FLONASE ) 50 MCG/ACT nasal spray, Place 1 spray into both nostrils daily., Disp: 16 g, Rfl: 2   hydrocortisone  2.5 % cream, Apply topically 2 (two) times daily., Disp: 30 g, Rfl: 0   losartan  (COZAAR ) 100 MG tablet, Take 1 tablet (100 mg total) by mouth  daily., Disp: 90 tablet, Rfl: 1   metoprolol  tartrate (LOPRESSOR ) 25 MG tablet, Take 1 tablet (25 mg total) by mouth 2 (two) times daily., Disp: 180 tablet, Rfl: 1  Review of Systems:  Negative unless indicated in HPI.   Physical Exam: Vitals:   11/09/24 1436 11/09/24 1446  BP: 130/80 128/78  Temp: 98.4 F (36.9 C)   TempSrc: Oral   SpO2: 99%   Weight: 190 lb 8 oz (86.4 kg)     Body mass index is 33.75 kg/m.   Physical Exam Vitals reviewed.  Constitutional:      Appearance: Normal appearance. She is  obese.  HENT:     Head: Normocephalic and atraumatic.  Eyes:     Conjunctiva/sclera: Conjunctivae normal.  Cardiovascular:     Rate and Rhythm: Normal rate. Rhythm irregular.     Heart sounds: Murmur heard.  Pulmonary:     Effort: Pulmonary effort is normal.     Breath sounds: Normal breath sounds.  Skin:    General: Skin is warm and dry.  Neurological:     General: No focal deficit present.     Mental Status: She is alert and oriented to person, place, and time.  Psychiatric:        Mood and Affect: Mood normal.        Behavior: Behavior normal.        Thought Content: Thought content normal.        Judgment: Judgment normal.      Impression and Plan:  Cardiac arrhythmia, unspecified cardiac arrhythmia type -     EKG 12-Lead -     Magnesium; Future -     Comprehensive metabolic panel with GFR; Future  Essential hypertension -     amLODIPine  Besylate; Take 1 tablet (5 mg total) by mouth daily.  Dispense: 90 tablet; Refill: 1 -     Losartan  Potassium; Take 1 tablet (100 mg total) by mouth daily.  Dispense: 90 tablet; Refill: 1 -     Metoprolol  Tartrate; Take 1 tablet (25 mg total) by mouth 2 (two) times daily.  Dispense: 180 tablet; Refill: 1  Type 2 diabetes mellitus with other specified complication, without long-term current use of insulin  (HCC) -     Dapagliflozin  Propanediol; Take 1 tablet (5 mg total) by mouth daily before breakfast.  Dispense: 90 tablet;  Refill: 1 -     POCT glycosylated hemoglobin (Hb A1C) -     Microalbumin / creatinine urine ratio; Future  Morbid obesity (HCC) -     Hydrocortisone ; Apply topically 2 (two) times daily.  Dispense: 30 g; Refill: 0  Hyperlipidemia associated with type 2 diabetes mellitus (HCC) -     Atorvastatin  Calcium ; Take 1 tablet (80 mg total) by mouth at bedtime.  Dispense: 90 tablet; Refill: 1 -     Ezetimibe ; Take 1 tablet (10 mg total) by mouth daily.  Dispense: 90 tablet; Refill: 1  Seasonal allergies -     Fluticasone  Propionate; Place 1 spray into both nostrils daily.  Dispense: 16 g; Refill: 2 -     Albuterol  Sulfate HFA; Inhale 2 puffs into the lungs every 6 (six) hours as needed for wheezing or shortness of breath.  Dispense: 18 each; Refill: 3   - EKG done in office today due to a perceived irregular rhythm shows normal sinus rhythm with frequent PVCs without acute ischemic changes.  Plan to check electrolytes today. - Refill all chronic medications. - Blood pressure is well-controlled. - In regards to her diabetes her A1c has increased to 7.8, likely reflecting poor dietary choices.  We have discussed lifestyle changes at length, she will continue Mounjaro  and Farxiga  and return in 3 months for follow-up.  Time spent:34 minutes reviewing chart, interviewing and examining patient and formulating plan of care.     Tully Theophilus Andrews, MD [facility]

## 2024-11-09 NOTE — Addendum Note (Signed)
 Addended by: THEOPHILUS DELMA KRABBE Y on: 11/09/2024 03:09 PM   Modules accepted: Orders

## 2024-11-10 LAB — COMPREHENSIVE METABOLIC PANEL WITH GFR
ALT: 16 U/L (ref 0–35)
AST: 14 U/L (ref 0–37)
Albumin: 4 g/dL (ref 3.5–5.2)
Alkaline Phosphatase: 76 U/L (ref 39–117)
BUN: 8 mg/dL (ref 6–23)
CO2: 29 meq/L (ref 19–32)
Calcium: 9.2 mg/dL (ref 8.4–10.5)
Chloride: 104 meq/L (ref 96–112)
Creatinine, Ser: 0.69 mg/dL (ref 0.40–1.20)
GFR: 94.92 mL/min (ref >60.00)
Glucose, Bld: 148 mg/dL — ABNORMAL HIGH (ref 70–99)
Potassium: 3.9 meq/L (ref 3.5–5.1)
Sodium: 142 meq/L (ref 135–145)
Total Bilirubin: 0.4 mg/dL (ref 0.2–1.2)
Total Protein: 7.4 g/dL (ref 6.0–8.3)

## 2024-11-10 LAB — MICROALBUMIN / CREATININE URINE RATIO
Creatinine,U: 64.8 mg/dL
Microalb Creat Ratio: UNDETERMINED mg/g (ref 0.0–30.0)
Microalb, Ur: <0.7 mg/dL

## 2024-11-10 LAB — MAGNESIUM: Magnesium: 1.9 mg/dL (ref 1.5–2.5)

## 2024-11-13 ENCOUNTER — Ambulatory Visit: Admitting: Internal Medicine

## 2024-11-15 ENCOUNTER — Ambulatory Visit

## 2024-11-20 ENCOUNTER — Ambulatory Visit: Payer: Self-pay | Admitting: Internal Medicine

## 2024-11-29 ENCOUNTER — Telehealth: Payer: Self-pay | Admitting: Orthopedic Surgery

## 2024-11-29 NOTE — Telephone Encounter (Signed)
 Pt called wanting to know what was found out about her MRI because she hasn't heard anything. Call back number is [phone].

## 2024-12-01 ENCOUNTER — Ambulatory Visit: Payer: Self-pay | Admitting: Surgical

## 2024-12-01 ENCOUNTER — Other Ambulatory Visit: Payer: Self-pay | Admitting: Radiology

## 2024-12-01 DIAGNOSIS — M542 Cervicalgia: Secondary | ICD-10-CM

## 2024-12-01 DIAGNOSIS — M5412 Radiculopathy, cervical region: Secondary | ICD-10-CM

## 2024-12-01 NOTE — Progress Notes (Signed)
 Katie Morse, I called and spoke with this patient and can we get her set up for cervical spine ESI with Dr. Eldonna?  Glenohumeral injection gave her about 20% relief at best which was expected based on her symptoms at her last visit.  Think this is more of her neck rather than her shoulder.  We will see how she does with neck injection.  Thank you

## 2024-12-01 NOTE — Telephone Encounter (Signed)
 I called her and sent message to [facility]

## 2024-12-26 ENCOUNTER — Other Ambulatory Visit: Payer: Self-pay | Admitting: Internal Medicine

## 2024-12-26 DIAGNOSIS — I10 Essential (primary) hypertension: Secondary | ICD-10-CM

## 2024-12-29 ENCOUNTER — Encounter: Admitting: Physical Medicine and Rehabilitation

## 2025-01-04 ENCOUNTER — Other Ambulatory Visit: Payer: Self-pay | Admitting: Internal Medicine

## 2025-01-04 DIAGNOSIS — I10 Essential (primary) hypertension: Secondary | ICD-10-CM

## 2025-01-11 ENCOUNTER — Ambulatory Visit: Admitting: Physical Medicine and Rehabilitation

## 2025-01-11 ENCOUNTER — Other Ambulatory Visit: Payer: Self-pay

## 2025-01-11 VITALS — BP 167/92 | HR 81

## 2025-01-11 DIAGNOSIS — M5412 Radiculopathy, cervical region: Secondary | ICD-10-CM | POA: Diagnosis not present

## 2025-01-11 MED ORDER — METHYLPREDNISOLONE ACETATE 40 MG/ML IJ SUSP
40.0000 mg | Freq: Once | INTRAMUSCULAR | Status: AC
  Administered 2025-01-11: 40 mg

## 2025-01-11 NOTE — Progress Notes (Signed)
 "  Katie Morse - 59 y.o. female MRN [id number]  Date of birth: August 25, 1966  Office Visit Note: Visit Date: 01/11/2025 PCP: Theophilus Andrews, Tully GRADE, MD Referred by: Theophilus Andrews, Tully GRADE, MD  Subjective: Chief Complaint  Patient presents with   Neck - Pain   HPI:  Katie Morse is a 59 y.o. female who comes in today at the request of Herlene Calix, PA-C for planned Left C7-T1 Cervical Interlaminar epidural steroid injection with fluoroscopic guidance.  The patient has failed conservative care including home exercise, medications, time and activity modification.  This injection will be diagnostic and hopefully therapeutic.  Please see requesting physician notes for further details and justification.   ROS Otherwise per HPI.  Assessment & Plan: Visit Diagnoses:    ICD-10-CM   1. Cervical radiculopathy  M54.12 XR C-ARM NO REPORT    Epidural Steroid injection    methylPREDNISolone  acetate (DEPO-MEDROL ) injection 40 mg      Plan: No additional findings.   Meds & Orders:  Meds ordered this encounter  Medications   methylPREDNISolone  acetate (DEPO-MEDROL ) injection 40 mg    Orders Placed This Encounter  Procedures   XR C-ARM NO REPORT   Epidural Steroid injection    Follow-up: Return for visit to requesting provider as needed.   Procedures: No procedures performed  Cervical Epidural Steroid Injection - Interlaminar Approach with Fluoroscopic Guidance  Patient: Katie Morse      Date of Birth: June 27, 1966 MRN: [id number] PCP: Theophilus Andrews, Tully GRADE, MD      Visit Date: 01/11/2025   Universal Protocol:    Date/Time: 01/11/2611:56 PM  Consent Given By: the patient  Position: PRONE  Additional Comments: Vital signs were monitored before and after the procedure. Patient was prepped and draped in the usual sterile fashion. The correct patient, procedure, and site was verified.   Injection Procedure Details:   Procedure  diagnoses: Cervical radiculopathy [M54.12]    Meds Administered:  Meds ordered this encounter  Medications   methylPREDNISolone  acetate (DEPO-MEDROL ) injection 40 mg     Laterality: Left  Location/Site: C7-T1  Needle: 3.5 in., 20 ga. Tuohy  Needle Placement: Paramedian epidural space  Findings:  -Comments: Excellent flow of contrast into the epidural space.  Procedure Details: Using a paramedian approach from the side mentioned above, the region overlying the inferior lamina was localized under fluoroscopic visualization and the soft tissues overlying this structure were infiltrated with 4 ml. of 1% Lidocaine  without Epinephrine . A # 20 gauge, Tuohy needle was inserted into the epidural space using a paramedian approach.  The epidural space was localized using loss of resistance along with contralateral oblique bi-planar fluoroscopic views.  After negative aspirate for air, blood, and CSF, a 2 ml. volume of Isovue-250 was injected into the epidural space and the flow of contrast was observed. Radiographs were obtained for documentation purposes.   The injectate was administered into the level noted above.  Additional Comments:  The patient tolerated the procedure well Dressing: 2 x 2 sterile gauze and Band-Aid    Post-procedure details: Patient was observed during the procedure. Post-procedure instructions were reviewed.  Patient left the clinic in stable condition.   Clinical History: MRI CERVICAL SPINE WITHOUT CONTRAST 11/09/2024 02:05:43 PM   TECHNIQUE: Multiplanar multisequence MRI of the cervical spine was performed.   COMPARISON: Cervical spine radiographs 10/20/2024.   CLINICAL HISTORY: 59 year old female with chronic neck pain, left radiculopathy, and left shoulder pain after fall.   FINDINGS:   BONES AND ALIGNMENT: Stable straightening  and mild reversal of cervical lordosis. Normal vertebral body heights. Bone marrow signal is unremarkable. Widespread  degenerative cervical vertebral endplate spurring. No marrow edema.   SPINAL CORD: Normal spinal cord size. No abnormal spinal cord signal.   SOFT TISSUES: No paraspinal mass. Negative cervicomedullary junction. Negative visible posterior fossa. Preserved major vascular flow voids in the bilateral neck. Negative visible neck soft tissues, thoracic inlet.   DEGENERATIVE:   C2-C3: Mild to moderate facet and ligament flavum hypertrophy on the left. Mild disc bulging. Moderate left C3 neural foraminal stenosis.   C3-C4: Asymmetric circumferential disc osteophyte complex more pronounced to the right. Mild facet and ligament flavum hypertrophy. Mild spinal stenosis and mild right hemicord mass effect (series 306 image 13). Moderate to severe right C4 neural foraminal stenosis.   C4-C5: Less pronounced disc bulging and endplate spurring asymmetric to the left. Mild facet hypertrophy. Mild spinal stenosis. No convincing cord mass effect. Moderate left C5 neural foraminal stenosis.   C5-C6: Rightward asymmetric circumferential disc osteophyte complex, prominent right paracentral posterior component on series 306 image 20. Mild spinal stenosis. Mild right hemicord mass effect. No foraminal stenosis.   C6-C7: Mild circumferential disc osteophyte complex. Mild facet and ligamentum flavum hypertrophy. No stenosis.   C7-T1: Mild posterior disc bulging. No stenosis.   Upper thoracic disc degeneration and disc bulging is evident, no obvious upper thoracic spinal stenosis.   IMPRESSION: 1. Widespread chronic cervical spine degeneration. Multilevel mild spinal stenosis, most pronounced at C3-C4 and C5-C6 with mild ventral cord mass effect at both levels, no abnormal spinal cord signal. 2. Moderate to severe right C4 neural foraminal stenosis. Moderate left C3 and C5 neural foraminal stenosis.   Electronically signed by: Helayne Hurst MD 11/13/2024 06:48 AM     Objective:  VS:  HT:     WT:   BMI:     BP:(!) 167/92  HR:81bpm  TEMP: ( )  RESP:  Physical Exam Vitals and nursing note reviewed.  Constitutional:      General: She is not in acute distress.    Appearance: Normal appearance. She is not ill-appearing.  HENT:     Head: Normocephalic and atraumatic.     Right Ear: External ear normal.     Left Ear: External ear normal.  Eyes:     Extraocular Movements: Extraocular movements intact.  Cardiovascular:     Rate and Rhythm: Normal rate.     Pulses: Normal pulses.  Musculoskeletal:     Cervical back: Tenderness present. No rigidity.     Right lower leg: No edema.     Left lower leg: No edema.     Comments: Patient has good strength in the upper extremities including 5 out of 5 strength in wrist extension long finger flexion and APB.  There is no atrophy of the hands intrinsically.  There is a negative Hoffmann's test.   Lymphadenopathy:     Cervical: No cervical adenopathy.  Skin:    Findings: No erythema, lesion or rash.  Neurological:     General: No focal deficit present.     Mental Status: She is alert and oriented to person, place, and time.     Sensory: No sensory deficit.     Motor: No weakness or abnormal muscle tone.     Coordination: Coordination normal.  Psychiatric:        Mood and Affect: Mood normal.        Behavior: Behavior normal.      Imaging: No results found. "

## 2025-01-11 NOTE — Progress Notes (Signed)
 Pain Scale   Average Pain 10 Patient advising her pain in her neck radiates to left side. Pain increases when moving her left arm up.        +Driver, -BT, -Dye Allergies.

## 2025-01-11 NOTE — Procedures (Signed)
 Cervical Epidural Steroid Injection - Interlaminar Approach with Fluoroscopic Guidance  Patient: Katie Morse      Date of Birth: 03-09-1966 MRN: [id number] PCP: Theophilus Andrews, Tully GRADE, MD      Visit Date: 01/11/2025   Universal Protocol:    Date/Time: 01/11/2611:56 PM  Consent Given By: the patient  Position: PRONE  Additional Comments: Vital signs were monitored before and after the procedure. Patient was prepped and draped in the usual sterile fashion. The correct patient, procedure, and site was verified.   Injection Procedure Details:   Procedure diagnoses: Cervical radiculopathy [M54.12]    Meds Administered:  Meds ordered this encounter  Medications   methylPREDNISolone  acetate (DEPO-MEDROL ) injection 40 mg     Laterality: Left  Location/Site: C7-T1  Needle: 3.5 in., 20 ga. Tuohy  Needle Placement: Paramedian epidural space  Findings:  -Comments: Excellent flow of contrast into the epidural space.  Procedure Details: Using a paramedian approach from the side mentioned above, the region overlying the inferior lamina was localized under fluoroscopic visualization and the soft tissues overlying this structure were infiltrated with 4 ml. of 1% Lidocaine  without Epinephrine . A # 20 gauge, Tuohy needle was inserted into the epidural space using a paramedian approach.  The epidural space was localized using loss of resistance along with contralateral oblique bi-planar fluoroscopic views.  After negative aspirate for air, blood, and CSF, a 2 ml. volume of Isovue-250 was injected into the epidural space and the flow of contrast was observed. Radiographs were obtained for documentation purposes.   The injectate was administered into the level noted above.  Additional Comments:  The patient tolerated the procedure well Dressing: 2 x 2 sterile gauze and Band-Aid    Post-procedure details: Patient was observed during the procedure. Post-procedure  instructions were reviewed.  Patient left the clinic in stable condition.

## 2025-01-22 ENCOUNTER — Other Ambulatory Visit: Payer: Self-pay | Admitting: Internal Medicine

## 2025-01-22 DIAGNOSIS — I10 Essential (primary) hypertension: Secondary | ICD-10-CM

## 2025-02-15 ENCOUNTER — Ambulatory Visit: Admitting: Internal Medicine
